# Patient Record
Sex: Female | Born: 1945
Health system: Southern US, Community
[De-identification: ages and names within clinical notes are randomized; demographics above are authoritative.]

## PROBLEM LIST (undated history)

## (undated) DIAGNOSIS — T7840XA Allergy, unspecified, initial encounter: Secondary | ICD-10-CM

## (undated) DIAGNOSIS — H269 Unspecified cataract: Secondary | ICD-10-CM

## (undated) DIAGNOSIS — N189 Chronic kidney disease, unspecified: Secondary | ICD-10-CM

## (undated) DIAGNOSIS — F419 Anxiety disorder, unspecified: Secondary | ICD-10-CM

## (undated) DIAGNOSIS — H409 Unspecified glaucoma: Secondary | ICD-10-CM

## (undated) DIAGNOSIS — M8580 Other specified disorders of bone density and structure, unspecified site: Secondary | ICD-10-CM

## (undated) DIAGNOSIS — M1990 Unspecified osteoarthritis, unspecified site: Secondary | ICD-10-CM

## (undated) DIAGNOSIS — E785 Hyperlipidemia, unspecified: Secondary | ICD-10-CM

## (undated) DIAGNOSIS — I10 Essential (primary) hypertension: Secondary | ICD-10-CM

## (undated) HISTORY — DX: Anxiety disorder, unspecified: F41.9

## (undated) HISTORY — DX: Essential (primary) hypertension: I10

## (undated) HISTORY — DX: Unspecified glaucoma: H40.9

## (undated) HISTORY — DX: Unspecified cataract: H26.9

## (undated) HISTORY — DX: Allergy, unspecified, initial encounter: T78.40XA

## (undated) HISTORY — DX: Unspecified osteoarthritis, unspecified site: M19.90

## (undated) HISTORY — DX: Hyperlipidemia, unspecified: E78.5

## (undated) HISTORY — PX: ABDOMINAL HYSTERECTOMY: SHX81

---

## 1998-01-30 ENCOUNTER — Other Ambulatory Visit: Admission: RE | Admit: 1998-01-30 | Discharge: 1998-01-30 | Payer: Self-pay | Admitting: Obstetrics & Gynecology

## 1998-05-02 ENCOUNTER — Inpatient Hospital Stay (HOSPITAL_COMMUNITY): Admission: RE | Admit: 1998-05-02 | Discharge: 1998-05-04 | Payer: Self-pay | Admitting: Obstetrics & Gynecology

## 2001-04-22 ENCOUNTER — Ambulatory Visit (HOSPITAL_COMMUNITY): Admission: RE | Admit: 2001-04-22 | Discharge: 2001-04-22 | Payer: Self-pay | Admitting: Family Medicine

## 2001-04-22 ENCOUNTER — Encounter: Payer: Self-pay | Admitting: Family Medicine

## 2002-05-02 ENCOUNTER — Other Ambulatory Visit: Admission: RE | Admit: 2002-05-02 | Discharge: 2002-05-02 | Payer: Self-pay | Admitting: Family Medicine

## 2002-07-26 ENCOUNTER — Ambulatory Visit (HOSPITAL_COMMUNITY): Admission: RE | Admit: 2002-07-26 | Discharge: 2002-07-26 | Payer: Self-pay | Admitting: Family Medicine

## 2002-07-26 ENCOUNTER — Encounter: Payer: Self-pay | Admitting: Family Medicine

## 2003-01-17 ENCOUNTER — Encounter: Admission: RE | Admit: 2003-01-17 | Discharge: 2003-01-17 | Payer: Self-pay | Admitting: Family Medicine

## 2003-01-17 ENCOUNTER — Encounter: Payer: Self-pay | Admitting: Family Medicine

## 2003-04-06 ENCOUNTER — Ambulatory Visit (HOSPITAL_COMMUNITY): Admission: RE | Admit: 2003-04-06 | Discharge: 2003-04-06 | Payer: Self-pay | Admitting: Family Medicine

## 2003-04-06 ENCOUNTER — Encounter: Payer: Self-pay | Admitting: Family Medicine

## 2003-11-27 ENCOUNTER — Other Ambulatory Visit: Admission: RE | Admit: 2003-11-27 | Discharge: 2003-11-27 | Payer: Self-pay | Admitting: Family Medicine

## 2003-12-10 ENCOUNTER — Ambulatory Visit (HOSPITAL_COMMUNITY): Admission: RE | Admit: 2003-12-10 | Discharge: 2003-12-10 | Payer: Self-pay | Admitting: Family Medicine

## 2004-03-10 ENCOUNTER — Encounter (INDEPENDENT_AMBULATORY_CARE_PROVIDER_SITE_OTHER): Payer: Self-pay | Admitting: *Deleted

## 2004-03-10 ENCOUNTER — Ambulatory Visit (HOSPITAL_COMMUNITY): Admission: RE | Admit: 2004-03-10 | Discharge: 2004-03-10 | Payer: Self-pay | Admitting: Gastroenterology

## 2005-02-17 ENCOUNTER — Other Ambulatory Visit: Admission: RE | Admit: 2005-02-17 | Discharge: 2005-02-17 | Payer: Self-pay | Admitting: Family Medicine

## 2005-02-27 ENCOUNTER — Encounter: Admission: RE | Admit: 2005-02-27 | Discharge: 2005-02-27 | Payer: Self-pay | Admitting: Family Medicine

## 2005-07-28 ENCOUNTER — Encounter: Admission: RE | Admit: 2005-07-28 | Discharge: 2005-07-28 | Payer: Self-pay | Admitting: Family Medicine

## 2006-03-30 ENCOUNTER — Other Ambulatory Visit: Admission: RE | Admit: 2006-03-30 | Discharge: 2006-03-30 | Payer: Self-pay | Admitting: Family Medicine

## 2006-04-14 ENCOUNTER — Encounter: Admission: RE | Admit: 2006-04-14 | Discharge: 2006-04-14 | Payer: Self-pay | Admitting: Family Medicine

## 2007-04-21 ENCOUNTER — Ambulatory Visit (HOSPITAL_COMMUNITY): Admission: RE | Admit: 2007-04-21 | Discharge: 2007-04-21 | Payer: Self-pay | Admitting: Family Medicine

## 2007-12-21 ENCOUNTER — Other Ambulatory Visit: Admission: RE | Admit: 2007-12-21 | Discharge: 2007-12-21 | Payer: Self-pay | Admitting: Family Medicine

## 2008-05-02 ENCOUNTER — Ambulatory Visit (HOSPITAL_COMMUNITY): Admission: RE | Admit: 2008-05-02 | Discharge: 2008-05-02 | Payer: Self-pay | Admitting: Family Medicine

## 2009-03-01 ENCOUNTER — Encounter: Admission: RE | Admit: 2009-03-01 | Discharge: 2009-03-01 | Payer: Self-pay | Admitting: Family Medicine

## 2009-03-06 ENCOUNTER — Encounter (HOSPITAL_COMMUNITY): Admission: RE | Admit: 2009-03-06 | Discharge: 2009-04-05 | Payer: Self-pay | Admitting: Family Medicine

## 2009-05-15 ENCOUNTER — Ambulatory Visit (HOSPITAL_COMMUNITY): Admission: RE | Admit: 2009-05-15 | Discharge: 2009-05-15 | Payer: Self-pay | Admitting: Family Medicine

## 2009-12-31 ENCOUNTER — Other Ambulatory Visit: Admission: RE | Admit: 2009-12-31 | Discharge: 2009-12-31 | Payer: Self-pay | Admitting: Family Medicine

## 2010-05-22 ENCOUNTER — Ambulatory Visit (HOSPITAL_COMMUNITY): Admission: RE | Admit: 2010-05-22 | Discharge: 2010-05-22 | Payer: Self-pay | Admitting: Family Medicine

## 2010-10-05 ENCOUNTER — Encounter: Payer: Self-pay | Admitting: Family Medicine

## 2010-12-26 ENCOUNTER — Other Ambulatory Visit: Payer: Self-pay | Admitting: Family Medicine

## 2010-12-26 ENCOUNTER — Other Ambulatory Visit (HOSPITAL_COMMUNITY)
Admission: RE | Admit: 2010-12-26 | Discharge: 2010-12-26 | Disposition: A | Discharge status: Home / Self Care | Payer: PRIVATE HEALTH INSURANCE | Source: Ambulatory Visit | Attending: Family Medicine | Admitting: Family Medicine

## 2010-12-26 DIAGNOSIS — Z01419 Encounter for gynecological examination (general) (routine) without abnormal findings: Secondary | ICD-10-CM | POA: Insufficient documentation

## 2011-01-30 NOTE — Op Note (Signed)
NAME:  Victoria Mcconnell, Victoria Mcconnell                          ACCOUNT NO.:  [id number]   MEDICAL RECORD NO.:  [id number]                   PATIENT TYPE:  AMB   LOCATION:  ENDO                                 FACILITY:  [facility]   PHYSICIAN:  Anselmo Rod, M.D.               DATE OF BIRTH:  [birth year]   DATE OF PROCEDURE:  03/10/2004  DATE OF DISCHARGE:                                 OPERATIVE REPORT   PROCEDURE PERFORMED:  Colonoscopy with biopsies.   ENDOSCOPIST:  Anselmo Rod, M.D.   INSTRUMENT USED:  Olympus video colonoscope.   INDICATION FOR PROCEDURE:  A 65 year old African-American female undergoing  screening colonoscopy.  Rule out colonic polyps, masses, etc.   PREPROCEDURE PREPARATION:  Informed consent was procured from the patient.  The patient was fasted for eight hours prior to the procedure and prepped  with a bottle of magnesium citrate and a gallon of GoLYTELY the night prior  to the procedure.   PREPROCEDURE PHYSICAL:  VITAL SIGNS:  The patient had stable vital signs.  NECK:  Supple.  CHEST:  Clear to auscultation.  S1, S2 regular.  ABDOMEN:  Soft with normal bowel sounds.   DESCRIPTION OF PROCEDURE:  The patient was placed in the left lateral  decubitus position and sedated with 100 mg of Demerol and 10 mg of Versed in  slow incremental doses.  Once the patient was adequately sedate and  maintained on low-flow oxygen and continuous cardiac monitoring, the Olympus  video colonoscope was advanced from the rectum to the cecum.  A polypoid  lesion was seen at 35 cm.  This was not snared.  The features of this were  somewhat atypical of a polyp.  This was not representative of an inverted  diverticulum, as it was larger than the usual inverted diverticulum and the  fact that the patient did not have any other diverticula in the sigmoid  colon.  Multiple biopsies were done.  The rest of the exam was unrevealing.   IMPRESSION:  Polypoid lesion, biopsied at 35 cm, otherwise  normal  colonoscopy.   RECOMMENDATIONS:  1. Await pathology results.  2. Further recommendations will be made depending on pathology results.  3. Outpatient follow-up in the next two weeks for further recommendations.                                               Anselmo Rod, M.D.    JNM/MEDQ  D:  03/10/2004  T:  03/10/2004  Job:  [id number]   cc:   Renaye Rakers, M.D.  [phone], [address]  [place]  [place] [address]  Fax: [phone]

## 2011-05-26 ENCOUNTER — Other Ambulatory Visit (HOSPITAL_COMMUNITY): Payer: Self-pay | Admitting: Family Medicine

## 2011-05-26 DIAGNOSIS — Z139 Encounter for screening, unspecified: Secondary | ICD-10-CM

## 2011-06-01 ENCOUNTER — Ambulatory Visit (HOSPITAL_COMMUNITY)
Admission: RE | Admit: 2011-06-01 | Discharge: 2011-06-01 | Disposition: A | Discharge status: Home / Self Care | Payer: PRIVATE HEALTH INSURANCE | Source: Ambulatory Visit | Attending: Family Medicine | Admitting: Family Medicine

## 2011-06-01 DIAGNOSIS — Z139 Encounter for screening, unspecified: Secondary | ICD-10-CM

## 2011-06-01 DIAGNOSIS — Z1231 Encounter for screening mammogram for malignant neoplasm of breast: Secondary | ICD-10-CM | POA: Insufficient documentation

## 2011-09-02 ENCOUNTER — Telehealth (HOSPITAL_COMMUNITY): Payer: Self-pay | Admitting: Dietician

## 2011-09-02 NOTE — Telephone Encounter (Signed)
Received referral on 08/31/11 from Dr. Parke Simmers for dx: HTN, diabetes, wt gain. Appointment scheduled for 09/09/11 @ 8:30 AM.

## 2011-09-09 ENCOUNTER — Encounter (HOSPITAL_COMMUNITY): Payer: Self-pay | Admitting: Dietician

## 2011-09-09 NOTE — Progress Notes (Signed)
Outpatient Initial Nutrition Assessment  Date:09/09/2011   Time: 8:30 AM  Referring Physician: Dr. Parke Simmers Reason for Visit: HTN, diabetes, weight gain  Nutrition Assessment:  Ht: 68" Wt: 211# IBW: 140# %IBW: 151% UBW: 180# %UBW: 117% BMI: 32.08 Goal Weight: 190 (10% weight loss) Weight hx: Pt reports that she has been struggling with her weight since having a hysterectomy in 1999. Per MD progress note, she has gained 7# (3.4%) x 1 year. Pt reports UBW of 180#. Her highest weight was 230# 5 years ago.   Estimated nutritional needs: 1751-1910 kcals daily, 77-96 grams protein daily, 1.8-1.9 L fluid daily  PMH: Past Medical History  Diagnosis Date  . Hypertension   . Arthritis   . Diabetes mellitus   . Hyperlipidemia     Medications:  Current Outpatient Rx  Name Route Sig Dispense Refill  . AMLODIPINE BESYLATE-VALSARTAN 5-160 MG PO TABS Oral Take 1 tablet by mouth daily.      . ATORVASTATIN CALCIUM 10 MG PO TABS Oral Take 20 mg by mouth daily.      . ATORVASTATIN CALCIUM 20 MG PO TABS Oral Take 20 mg by mouth daily.      . CELECOXIB 200 MG PO CAPS Oral Take 200 mg by mouth 2 (two) times daily.      [name] Kitchen CLINDAMYCIN PHOSPHATE 1 % EX SOLN Topical Apply topically 2 (two) times daily.      [name] Kitchen CLINDAMYCIN PHOS-BENZOYL PEROX 1.2-5 % EX GEL Topical Apply topically 2 (two) times daily.      [name] Kitchen DEXAMETHASONE 4 MG PO TABS Oral Take 4 mg by mouth daily.      . DORZOLAMIDE HCL-TIMOLOL MAL 22.3-6.8 MG/ML OP SOLN  1 drop 2 (two) times daily.      [name] Kitchen HYDROCORTISONE ACE-PRAMOXINE 1-1 % RE FOAM Rectal Place 1 applicator rectally 2 (two) times daily.      [name] Kitchen HYDROQUINONE 4 % EX CREA Topical Apply topically daily.      [name] Kitchen SITAGLIPTIN-METFORMIN HCL 50-500 MG PO TABS Oral Take 1 tablet by mouth daily.      . TERBINAFINE HCL 250 MG PO TABS Oral Take 250 mg by mouth daily.        Labs:  Per Dr. Tedra Senegal office note: CMET: Na: 141, K: 3.7, Cl: 106, CO2: 28, BUN: 15, Creat: 0.97, Glucose: 91, albumin: 4.3 CBC:  Hgb: 12, Hct: 35.7 Lipid Panel: Total Cholesterol: 146, HDL: 65, LDL: 67, Triglycerides: 69 Hgb A1c: 5.7   Nutrition hx/habits: Ms Stockburger is a very pleasant lady who is concerned about her health and weight gain. 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 She reports that she exercises daily (30 minutes daily on the stationery bike and 4560 minutes walking 2 times weekly) to improve her health. She reports that she is getting tired of eating salads and has cut back on red meat, potatoes, and canned foods. Pt monitors her blood sugar every morning (fasting) and readings range from 90-111. She has been eating more fruit because she was concerned about getting enough fiber in her diet after she had her colonoscopy to prevent polyps. She reports her stress level as 8 on a scale of 1-10; she assists her sister caring for their younger sister who is disabled and requires 24 hour care. She reports she went 4 weeks without exercising due to her exercise bike being broken. She eats out once a week, visiting [employer], [place], [place], [place], and  [employer].   Diet recall: 5:30 AM- wake up and 30 minute bike ride; 6:30 AM- diet green tea with medications; Breakfast (8:30 AM): oatmeal and peanut butter crackers; Snack: salted peanuts or walnuts; Lunch (12:30 PM): salad (lettuce, cucumber, tomato), granola, water; Snack (4:30 PM): apple, grapes, pear, and banana; Dinner (7-7:30): baked chicken, frozen vegetable, starch, water; Snack: popcorn and orange.   Nutrition Diagnosis: Excessive energy intake r/t nutrition related knowledge deficit, decreased physical activity AEB 3.4% weight gain x 1 year.  Nutrition Intervention: Nutrition rx: 1500 kcal NAS, high fiber,diabetic diet; 3 meals/day (45-60 grams carbohydrate per meal); 3 snacks (15-30 grams carbohydrate per snack); limit 1 piece of fruit per snack; at least 2.5 hours physical activity daily; low  calorie beverages only  Education/Counseling Provided: Educated pt on portion control. Discussed carbohydrate containing foods and portion sizes. Discussed new ideas for meals, snacks, and beverages. Discussed importance of a healthy diet in combination with exercise to promote weight loss. Discussed slow moderate weight loss of 1-2# per week. Discussed high fiber foods and ways to incorporate additional fiber into diet. Provided plate method, managing your diabetes, and handouts from the Academy of Nutrition and Dietetics re: diabetes.   Understanding, Motivation, Ability to Follow Recommendations: Expect good compliance. Pt is eager and willing to change her lifestyle to achieve her goals.   Monitoring and Evaluation: Goals: 1) 1-2# weight loss per week; 2) >2.5 hours physical activity daily; 3) Maintain Hgb A1c  Recommendations: 1) For weight loss: 1251-1410 kcals daily; 2) Incorporate fruit and high fiber foods into all meals and snacks (ex beans with salad at lunch or banana and peanut butter for snack; 3) Add small fruit slices or sugar-free drink mix to flavor water; 4) Keep food diary; 5) Weigh weekly at same day/ same time (ex. Weigh every week on Saturday at 10 AM)  F/U: PRN. Pt given RD contact information and encouraged to call with questions.   Orlene Plum, RD  09/09/2011  Time: 8:30 AM

## 2011-12-29 ENCOUNTER — Other Ambulatory Visit: Payer: Self-pay | Admitting: Family Medicine

## 2011-12-29 ENCOUNTER — Other Ambulatory Visit (HOSPITAL_COMMUNITY)
Admission: RE | Admit: 2011-12-29 | Discharge: 2011-12-29 | Disposition: A | Discharge status: Home / Self Care | Payer: PRIVATE HEALTH INSURANCE | Source: Ambulatory Visit | Attending: Family Medicine | Admitting: Family Medicine

## 2011-12-29 DIAGNOSIS — Z01419 Encounter for gynecological examination (general) (routine) without abnormal findings: Secondary | ICD-10-CM | POA: Insufficient documentation

## 2012-05-19 ENCOUNTER — Other Ambulatory Visit (HOSPITAL_COMMUNITY): Payer: Self-pay | Admitting: Family Medicine

## 2012-05-19 DIAGNOSIS — Z139 Encounter for screening, unspecified: Secondary | ICD-10-CM

## 2012-06-06 ENCOUNTER — Ambulatory Visit (HOSPITAL_COMMUNITY)
Admission: RE | Admit: 2012-06-06 | Discharge: 2012-06-06 | Disposition: A | Discharge status: Home / Self Care | Payer: PRIVATE HEALTH INSURANCE | Source: Ambulatory Visit | Attending: Family Medicine | Admitting: Family Medicine

## 2012-06-06 DIAGNOSIS — Z139 Encounter for screening, unspecified: Secondary | ICD-10-CM

## 2012-06-06 DIAGNOSIS — Z1231 Encounter for screening mammogram for malignant neoplasm of breast: Secondary | ICD-10-CM | POA: Insufficient documentation

## 2013-01-02 ENCOUNTER — Other Ambulatory Visit: Payer: Self-pay | Admitting: Family Medicine

## 2013-01-02 ENCOUNTER — Other Ambulatory Visit (HOSPITAL_COMMUNITY)
Admission: RE | Admit: 2013-01-02 | Discharge: 2013-01-02 | Disposition: A | Discharge status: Home / Self Care | Payer: PRIVATE HEALTH INSURANCE | Source: Ambulatory Visit | Attending: Family Medicine | Admitting: Family Medicine

## 2013-01-02 DIAGNOSIS — Z01419 Encounter for gynecological examination (general) (routine) without abnormal findings: Secondary | ICD-10-CM | POA: Insufficient documentation

## 2013-01-02 DIAGNOSIS — Z1151 Encounter for screening for human papillomavirus (HPV): Secondary | ICD-10-CM | POA: Insufficient documentation

## 2013-05-10 ENCOUNTER — Other Ambulatory Visit (HOSPITAL_COMMUNITY): Payer: Self-pay | Admitting: Family Medicine

## 2013-05-10 DIAGNOSIS — Z139 Encounter for screening, unspecified: Secondary | ICD-10-CM

## 2013-06-08 ENCOUNTER — Ambulatory Visit (HOSPITAL_COMMUNITY)
Admission: RE | Admit: 2013-06-08 | Discharge: 2013-06-08 | Disposition: A | Discharge status: Home / Self Care | Payer: PRIVATE HEALTH INSURANCE | Source: Ambulatory Visit | Attending: Family Medicine | Admitting: Family Medicine

## 2013-06-08 DIAGNOSIS — Z139 Encounter for screening, unspecified: Secondary | ICD-10-CM

## 2013-06-08 DIAGNOSIS — Z1231 Encounter for screening mammogram for malignant neoplasm of breast: Secondary | ICD-10-CM | POA: Insufficient documentation

## 2014-03-29 DIAGNOSIS — H4011X0 Primary open-angle glaucoma, stage unspecified: Secondary | ICD-10-CM | POA: Diagnosis not present

## 2014-03-29 DIAGNOSIS — H409 Unspecified glaucoma: Secondary | ICD-10-CM | POA: Diagnosis not present

## 2014-04-18 DIAGNOSIS — L708 Other acne: Secondary | ICD-10-CM | POA: Diagnosis not present

## 2014-04-18 DIAGNOSIS — L680 Hirsutism: Secondary | ICD-10-CM | POA: Diagnosis not present

## 2014-04-18 DIAGNOSIS — L818 Other specified disorders of pigmentation: Secondary | ICD-10-CM | POA: Insufficient documentation

## 2014-04-18 DIAGNOSIS — L700 Acne vulgaris: Secondary | ICD-10-CM | POA: Insufficient documentation

## 2014-04-18 DIAGNOSIS — L819 Disorder of pigmentation, unspecified: Secondary | ICD-10-CM | POA: Diagnosis not present

## 2014-05-08 DIAGNOSIS — E119 Type 2 diabetes mellitus without complications: Secondary | ICD-10-CM | POA: Diagnosis not present

## 2014-05-10 DIAGNOSIS — I10 Essential (primary) hypertension: Secondary | ICD-10-CM | POA: Diagnosis not present

## 2014-05-10 DIAGNOSIS — E119 Type 2 diabetes mellitus without complications: Secondary | ICD-10-CM | POA: Diagnosis not present

## 2014-05-10 DIAGNOSIS — E7800 Pure hypercholesterolemia, unspecified: Secondary | ICD-10-CM | POA: Diagnosis not present

## 2014-05-22 ENCOUNTER — Other Ambulatory Visit (HOSPITAL_COMMUNITY): Payer: Self-pay | Admitting: Family Medicine

## 2014-05-22 DIAGNOSIS — Z1231 Encounter for screening mammogram for malignant neoplasm of breast: Secondary | ICD-10-CM

## 2014-06-11 ENCOUNTER — Ambulatory Visit (HOSPITAL_COMMUNITY)
Admission: RE | Admit: 2014-06-11 | Discharge: 2014-06-11 | Disposition: A | Discharge status: Home / Self Care | Payer: Medicare Other | Source: Ambulatory Visit | Attending: Family Medicine | Admitting: Family Medicine

## 2014-06-11 DIAGNOSIS — Z1231 Encounter for screening mammogram for malignant neoplasm of breast: Secondary | ICD-10-CM

## 2014-07-02 DIAGNOSIS — H4011X1 Primary open-angle glaucoma, mild stage: Secondary | ICD-10-CM | POA: Diagnosis not present

## 2014-07-10 DIAGNOSIS — I10 Essential (primary) hypertension: Secondary | ICD-10-CM | POA: Diagnosis not present

## 2014-07-10 DIAGNOSIS — E119 Type 2 diabetes mellitus without complications: Secondary | ICD-10-CM | POA: Diagnosis not present

## 2014-07-10 DIAGNOSIS — E784 Other hyperlipidemia: Secondary | ICD-10-CM | POA: Diagnosis not present

## 2014-07-19 DIAGNOSIS — Z0000 Encounter for general adult medical examination without abnormal findings: Secondary | ICD-10-CM | POA: Diagnosis not present

## 2014-10-02 DIAGNOSIS — H4011X1 Primary open-angle glaucoma, mild stage: Secondary | ICD-10-CM | POA: Diagnosis not present

## 2014-10-02 DIAGNOSIS — H2513 Age-related nuclear cataract, bilateral: Secondary | ICD-10-CM | POA: Diagnosis not present

## 2014-10-02 DIAGNOSIS — E119 Type 2 diabetes mellitus without complications: Secondary | ICD-10-CM | POA: Diagnosis not present

## 2014-10-31 DIAGNOSIS — L700 Acne vulgaris: Secondary | ICD-10-CM | POA: Diagnosis not present

## 2014-10-31 DIAGNOSIS — L659 Nonscarring hair loss, unspecified: Secondary | ICD-10-CM | POA: Diagnosis not present

## 2014-10-31 DIAGNOSIS — L680 Hirsutism: Secondary | ICD-10-CM | POA: Diagnosis not present

## 2014-10-31 DIAGNOSIS — L669 Cicatricial alopecia, unspecified: Secondary | ICD-10-CM | POA: Diagnosis not present

## 2014-10-31 DIAGNOSIS — L731 Pseudofolliculitis barbae: Secondary | ICD-10-CM | POA: Diagnosis not present

## 2014-10-31 DIAGNOSIS — L089 Local infection of the skin and subcutaneous tissue, unspecified: Secondary | ICD-10-CM | POA: Diagnosis not present

## 2014-10-31 DIAGNOSIS — L818 Other specified disorders of pigmentation: Secondary | ICD-10-CM | POA: Diagnosis not present

## 2014-11-05 DIAGNOSIS — I10 Essential (primary) hypertension: Secondary | ICD-10-CM | POA: Diagnosis not present

## 2014-11-05 DIAGNOSIS — E0800 Diabetes mellitus due to underlying condition with hyperosmolarity without nonketotic hyperglycemic-hyperosmolar coma (NKHHC): Secondary | ICD-10-CM | POA: Diagnosis not present

## 2014-11-05 DIAGNOSIS — E782 Mixed hyperlipidemia: Secondary | ICD-10-CM | POA: Diagnosis not present

## 2014-11-07 DIAGNOSIS — E119 Type 2 diabetes mellitus without complications: Secondary | ICD-10-CM | POA: Diagnosis not present

## 2014-11-07 DIAGNOSIS — I10 Essential (primary) hypertension: Secondary | ICD-10-CM | POA: Diagnosis not present

## 2014-11-27 DIAGNOSIS — I10 Essential (primary) hypertension: Secondary | ICD-10-CM | POA: Diagnosis not present

## 2014-11-27 DIAGNOSIS — Z9114 Patient's other noncompliance with medication regimen: Secondary | ICD-10-CM | POA: Diagnosis not present

## 2014-11-27 DIAGNOSIS — E119 Type 2 diabetes mellitus without complications: Secondary | ICD-10-CM | POA: Diagnosis not present

## 2014-12-31 ENCOUNTER — Encounter: Payer: Self-pay | Admitting: *Deleted

## 2015-01-01 DIAGNOSIS — H4011X1 Primary open-angle glaucoma, mild stage: Secondary | ICD-10-CM | POA: Diagnosis not present

## 2015-01-16 ENCOUNTER — Ambulatory Visit
Admission: RE | Admit: 2015-01-16 | Discharge: 2015-01-16 | Disposition: A | Discharge status: Home / Self Care | Payer: Medicare Other | Source: Ambulatory Visit | Attending: Family Medicine | Admitting: Family Medicine

## 2015-01-16 ENCOUNTER — Ambulatory Visit (INDEPENDENT_AMBULATORY_CARE_PROVIDER_SITE_OTHER): Payer: Medicare Other | Admitting: Family Medicine

## 2015-01-16 ENCOUNTER — Encounter: Payer: Self-pay | Admitting: Family Medicine

## 2015-01-16 VITALS — BP 130/74 | HR 74 | Temp 98.0°F | Resp 12 | Ht 68.0 in | Wt 212.0 lb

## 2015-01-16 DIAGNOSIS — E669 Obesity, unspecified: Secondary | ICD-10-CM | POA: Diagnosis not present

## 2015-01-16 DIAGNOSIS — E785 Hyperlipidemia, unspecified: Secondary | ICD-10-CM

## 2015-01-16 DIAGNOSIS — M25512 Pain in left shoulder: Secondary | ICD-10-CM | POA: Diagnosis not present

## 2015-01-16 DIAGNOSIS — E119 Type 2 diabetes mellitus without complications: Secondary | ICD-10-CM

## 2015-01-16 DIAGNOSIS — M47812 Spondylosis without myelopathy or radiculopathy, cervical region: Secondary | ICD-10-CM | POA: Diagnosis not present

## 2015-01-16 DIAGNOSIS — I10 Essential (primary) hypertension: Secondary | ICD-10-CM

## 2015-01-16 DIAGNOSIS — S93402A Sprain of unspecified ligament of left ankle, initial encounter: Secondary | ICD-10-CM

## 2015-01-16 DIAGNOSIS — M9971 Connective tissue and disc stenosis of intervertebral foramina of cervical region: Secondary | ICD-10-CM | POA: Diagnosis not present

## 2015-01-16 DIAGNOSIS — R7303 Prediabetes: Secondary | ICD-10-CM | POA: Insufficient documentation

## 2015-01-16 DIAGNOSIS — Z6833 Body mass index (BMI) 33.0-33.9, adult: Secondary | ICD-10-CM | POA: Insufficient documentation

## 2015-01-16 DIAGNOSIS — M19011 Primary osteoarthritis, right shoulder: Secondary | ICD-10-CM | POA: Diagnosis not present

## 2015-01-16 DIAGNOSIS — H409 Unspecified glaucoma: Secondary | ICD-10-CM

## 2015-01-16 DIAGNOSIS — M4312 Spondylolisthesis, cervical region: Secondary | ICD-10-CM | POA: Diagnosis not present

## 2015-01-16 DIAGNOSIS — Z87828 Personal history of other (healed) physical injury and trauma: Secondary | ICD-10-CM | POA: Diagnosis not present

## 2015-01-16 MED ORDER — METFORMIN HCL 1000 MG PO TABS: 1000.0000 mg | ORAL_TABLET | Freq: Two times a day (BID) | ORAL | Status: DC

## 2015-01-16 NOTE — Assessment & Plan Note (Signed)
Unable to afford Janumet, normal renal function Will place on MTF 1000mg  BID,

## 2015-01-16 NOTE — Patient Instructions (Addendum)
Take Metformin 1000mg  twice a day  Take aleve 1 tablet twice a day  Continue all other medications Get the xray done on your shoulder F/U 6 weeks for fasting labs

## 2015-01-16 NOTE — Progress Notes (Signed)
Patient ID: Victoria Mcconnell, female   DOB: Dec 27, 1945, 69 y.o.   MRN: [id number]   Subjective:    Patient ID: Victoria Mcconnell, female    DOB: 02/26/1946, 69 y.o.   MRN: [id number]  Patient presents for New Patient- Establish Care and L Ankle Pain  Pt here to establsih care. Previous PCP Dr. Darlyne Russian GI- Dr. Juanita Craver,  Derm- Dr. Kemper Durie Medication and history reviewed   DM- Diagnosed in 2004, last A1C in Feb 6.2%, unable to afford Janumet cost her $30 or more per month, needs Tier 1 drug, no hypoglycemia, test daily   Glaucoma- [facility] on drops   HTN- since her 30's well controlled with meds, does not check at home,normal renal and liver function   Sprained left ankle 3 weeks ago, stepped wrong off her steps at home, twisted foot, had some mild swelling and pain. Used epson salt soaks, now only has pain if she twist it a wrong way, no NSAIDS taken   Chronic left shoulder pain, had injury on the job > 5 years ago, never saught care, gets a pinching pain and soreness of upper arm and shoulder from time to time, no tingling or numbness in finger  Colonoscopy, Mammo, UTD, will need Prevnar 13 next visit  Review Of Systems:  GEN- denies fatigue, fever, weight loss,weakness, recent illness HEENT- denies eye drainage, change in vision, nasal discharge, CVS- denies chest pain, palpitations RESP- denies SOB, cough, wheeze ABD- denies N/V, change in stools, abd pain GU- denies dysuria, hematuria, dribbling, incontinence MSK- denies joint pain, muscle aches, injury Neuro- denies headache, dizziness, syncope, seizure activity       Objective:    BP 130/74 mmHg  Pulse 74  Temp(Src) 98 F (36.7 C) (Oral)  Resp 12  Ht 5\' 8"  (1.727 m)  Wt 212 lb (96.163 kg)  BMI 32.24 kg/m2 GEN- NAD, alert and oriented x3 HEENT- PERRL, EOMI, non injected sclera, pink conjunctiva, MMM, oropharynx clear Neck- Supple, no thyromegaly, fair ROM CVS- RRR, no  murmur RESP-CTAB ABD-NABS,soft,NT,ND MSK- Left ankle, good ROM, NT, neg squeeze test, able to bear weight,, mild swelling left lateral malleous, Spine NT, fair ROM upper ext shoulder, right shoulder sits up a little higher than left EXT- No edema Pulses- Radial, DP- 2+        Assessment & Plan:      Problem List Items Addressed This Visit    Obesity    Discussed healthy diet and eating      Relevant Medications   metFORMIN (GLUCOPHAGE) 1000 MG tablet   Left shoulder pain    Old injury, like has some DJD and DDD in neck, obtain xrays Start aleve      Relevant Orders   DG Shoulder Right (Completed)   DG Cervical Spine Complete (Completed)   Hyperlipidemia    Lipids at goal on statin      Glaucoma    Followed by eye doctor      Essential hypertension, benign    Well controlled no change to meds      Diabetes mellitus type 2, controlled    Unable to afford Janumet, normal renal function Will place on MTF 1000mg  BID,       Relevant Medications   metFORMIN (GLUCOPHAGE) 1000 MG tablet    Other Visit Diagnoses    Left ankle sprain, initial encounter    -  Primary    good ROM, mild swelling at lateral malleous, weight bearing, Aleve BID  Note: This dictation was prepared with Dragon dictation along with smaller phrase technology. Any transcriptional errors that result from this process are unintentional.

## 2015-01-16 NOTE — Assessment & Plan Note (Signed)
Well controlled no change to meds 

## 2015-01-16 NOTE — Assessment & Plan Note (Signed)
Old injury, like has some DJD and DDD in neck, obtain xrays Start aleve

## 2015-01-16 NOTE — Assessment & Plan Note (Signed)
Lipids at goal on statin

## 2015-01-16 NOTE — Assessment & Plan Note (Signed)
Discussed healthy diet and eating

## 2015-01-16 NOTE — Assessment & Plan Note (Signed)
Followed by eye doctor.  

## 2015-02-27 ENCOUNTER — Ambulatory Visit (INDEPENDENT_AMBULATORY_CARE_PROVIDER_SITE_OTHER): Payer: Medicare Other | Admitting: Family Medicine

## 2015-02-27 ENCOUNTER — Encounter: Payer: Self-pay | Admitting: Family Medicine

## 2015-02-27 VITALS — BP 126/72 | HR 74 | Temp 98.1°F | Resp 12 | Ht 68.0 in | Wt 209.0 lb

## 2015-02-27 DIAGNOSIS — M8949 Other hypertrophic osteoarthropathy, multiple sites: Secondary | ICD-10-CM

## 2015-02-27 DIAGNOSIS — M1990 Unspecified osteoarthritis, unspecified site: Secondary | ICD-10-CM | POA: Insufficient documentation

## 2015-02-27 DIAGNOSIS — S93409A Sprain of unspecified ligament of unspecified ankle, initial encounter: Secondary | ICD-10-CM | POA: Insufficient documentation

## 2015-02-27 DIAGNOSIS — S93402D Sprain of unspecified ligament of left ankle, subsequent encounter: Secondary | ICD-10-CM | POA: Diagnosis not present

## 2015-02-27 DIAGNOSIS — M159 Polyosteoarthritis, unspecified: Secondary | ICD-10-CM

## 2015-02-27 DIAGNOSIS — M150 Primary generalized (osteo)arthritis: Secondary | ICD-10-CM

## 2015-02-27 DIAGNOSIS — E119 Type 2 diabetes mellitus without complications: Secondary | ICD-10-CM

## 2015-02-27 DIAGNOSIS — M7070 Other bursitis of hip, unspecified hip: Secondary | ICD-10-CM | POA: Insufficient documentation

## 2015-02-27 DIAGNOSIS — E785 Hyperlipidemia, unspecified: Secondary | ICD-10-CM | POA: Diagnosis not present

## 2015-02-27 DIAGNOSIS — M7072 Other bursitis of hip, left hip: Secondary | ICD-10-CM

## 2015-02-27 DIAGNOSIS — I10 Essential (primary) hypertension: Secondary | ICD-10-CM

## 2015-02-27 LAB — COMPLETE METABOLIC PANEL WITH GFR
ALBUMIN: 4.3 g/dL (ref 3.5–5.2)
ALK PHOS: 65 U/L (ref 39–117)
ALT: 18 U/L (ref 0–35)
AST: 22 U/L (ref 0–37)
BUN: 13 mg/dL (ref 6–23)
CO2: 26 mEq/L (ref 19–32)
Calcium: 9.9 mg/dL (ref 8.4–10.5)
Chloride: 106 mEq/L (ref 96–112)
Creat: 0.96 mg/dL (ref 0.50–1.10)
GFR, Est African American: 71 mL/min
GFR, Est Non African American: 61 mL/min
Glucose, Bld: 92 mg/dL (ref 70–99)
POTASSIUM: 4 meq/L (ref 3.5–5.3)
SODIUM: 138 meq/L (ref 135–145)
Total Bilirubin: 0.5 mg/dL (ref 0.2–1.2)
Total Protein: 7.3 g/dL (ref 6.0–8.3)

## 2015-02-27 LAB — CBC WITH DIFFERENTIAL/PLATELET
Basophils Absolute: 0.1 10*3/uL (ref 0.0–0.1)
Basophils Relative: 1 % (ref 0–1)
EOS ABS: 0.5 10*3/uL (ref 0.0–0.7)
Eosinophils Relative: 6 % — ABNORMAL HIGH (ref 0–5)
HEMATOCRIT: 33.5 % — AB (ref 36.0–46.0)
Hemoglobin: 11.2 g/dL — ABNORMAL LOW (ref 12.0–15.0)
LYMPHS ABS: 1.6 10*3/uL (ref 0.7–4.0)
Lymphocytes Relative: 21 % (ref 12–46)
MCH: 28.9 pg (ref 26.0–34.0)
MCHC: 33.4 g/dL (ref 30.0–36.0)
MCV: 86.6 fL (ref 78.0–100.0)
MONO ABS: 0.5 10*3/uL (ref 0.1–1.0)
MPV: 9.9 fL (ref 8.6–12.4)
Monocytes Relative: 7 % (ref 3–12)
Neutro Abs: 4.9 10*3/uL (ref 1.7–7.7)
Neutrophils Relative %: 65 % (ref 43–77)
Platelets: 247 10*3/uL (ref 150–400)
RBC: 3.87 MIL/uL (ref 3.87–5.11)
RDW: 14.7 % (ref 11.5–15.5)
WBC: 7.6 10*3/uL (ref 4.0–10.5)

## 2015-02-27 LAB — HEMOGLOBIN A1C
Hgb A1c MFr Bld: 6.1 % — ABNORMAL HIGH (ref <5.7)
Mean Plasma Glucose: 128 mg/dL — ABNORMAL HIGH (ref <117)

## 2015-02-27 LAB — LIPID PANEL
Cholesterol: 144 mg/dL (ref 0–200)
HDL: 67 mg/dL (ref >=46)
LDL CALC: 61 mg/dL (ref 0–99)
Total CHOL/HDL Ratio: 2.1 Ratio
Triglycerides: 81 mg/dL (ref <150)
VLDL: 16 mg/dL (ref 0–40)

## 2015-02-27 MED ORDER — AMLODIPINE-VALSARTAN-HCTZ 5-160-25 MG PO TABS: 1.0000 | ORAL_TABLET | Freq: Every morning | ORAL | Status: DC

## 2015-02-27 MED ORDER — METHYLPREDNISOLONE 4 MG PO TBPK: ORAL_TABLET | ORAL | Status: DC

## 2015-02-27 NOTE — Patient Instructions (Signed)
Take the medrol dosepak as prescribed Do not take with aleve We will call you with results F/U 4 months

## 2015-02-27 NOTE — Progress Notes (Signed)
Patient ID: Victoria Mcconnell, female   DOB: 1946/07/04, 69 y.o.   MRN: [id number]   Subjective:    Patient ID: Victoria Mcconnell, female    DOB: Mar 26, 1946, 69 y.o.   MRN: [id number]  Patient presents for 6 week F/U  Here for interim follow-up and for fasting labs. I reviewed her previous PCP records and labs. Diabetes mellitus her last A1c was 6.2% back in February. We change her to metformin she has not had any problems with this medication and her blood sugar has been staying down.  She did have some upset stomach with taking Lipitor in the morning she is now changed to set nighttime is not had any problems her last cholesterol panel was normal with an LDL of 68.  She continues to have some discomfort from her ankles since she injured it back in April she still walking and exercising but she does notice after long. The time when she bears weight on it some discomfort. The Aleve does help. She also complains of some hip pain on and off with a numbness feeling right on the lateral part of her hip. She denies any recent back pain denies any tingling or numbness in her lower extremities no change in bowel or bladder. She states that she's had back problems in the past.   Review Of Systems:  GEN- denies fatigue, fever, weight loss,weakness, recent illness HEENT- denies eye drainage, change in vision, nasal discharge, CVS- denies chest pain, palpitations RESP- denies SOB, cough, wheeze ABD- denies N/V, change in stools, abd pain GU- denies dysuria, hematuria, dribbling, incontinence MSK- + joint pain, muscle aches, injury Neuro- denies headache, dizziness, syncope, seizure activity       Objective:    BP 126/72 mmHg  Pulse 74  Temp(Src) 98.1 F (36.7 C) (Oral)  Resp 12  Ht 5\' 8"  (1.727 m)  Wt 209 lb (94.802 kg)  BMI 31.79 kg/m2 GEN- NAD, alert and oriented x3 HEENT- PERRL, EOMI, non injected sclera, pink conjunctiva, MMM, oropharynx clear CVS- RRR, no murmur RESP-CTAB MSK- Left ankle,  good ROM, NT, , discomfort with extension of foot, neg squeeze test, able to bear weight,, mild swelling left lateral malleous, Spine NT, fair ROM , Bilat Hip- fair ROM, pain with IR left hip, mild TTP over tronchanter region, neg SLR   EXT- No edema Pulses- Radial, DP- 2+    Assessment & Plan:      Problem List Items Addressed This Visit    OA (osteoarthritis)   Relevant Medications   methylPREDNISolone (MEDROL DOSEPAK) 4 MG TBPK tablet   Hyperlipidemia   Relevant Medications   Amlodipine-Valsartan-HCTZ 5-160-25 MG TABS   Other Relevant Orders   COMPLETE METABOLIC PANEL WITH GFR   CBC with Differential   Hemoglobin A1c   Lipid Panel   Hip bursitis   Essential hypertension, benign   Relevant Medications   Amlodipine-Valsartan-HCTZ 5-160-25 MG TABS   Other Relevant Orders   COMPLETE METABOLIC PANEL WITH GFR   CBC with Differential   Hemoglobin A1c   Lipid Panel   Diabetes mellitus type 2, controlled - Primary   Relevant Medications   Amlodipine-Valsartan-HCTZ 5-160-25 MG TABS   Other Relevant Orders   Microalbumin / creatinine urine ratio   COMPLETE METABOLIC PANEL WITH GFR   CBC with Differential   Hemoglobin A1c   Lipid Panel   Ankle sprain      Note: This dictation was prepared with Dragon dictation along with smaller phrase technology. Any transcriptional errors that  result from this process are unintentional.

## 2015-02-27 NOTE — Assessment & Plan Note (Signed)
I think she may suffer a severe ankle sprain back in April but she is doing all of her regular activities so I see no reason to truly image at this time. She declines orthopedics

## 2015-02-27 NOTE — Assessment & Plan Note (Signed)
Continue Lipitor at bedtime check her cholesterol panel today as well as liver function tests

## 2015-02-27 NOTE — Assessment & Plan Note (Signed)
Diabetes is well-controlled recheck A1c continue metformin

## 2015-02-27 NOTE — Assessment & Plan Note (Signed)
She has known generalized osteoarthritis she has it on the x-rays with degenerative changes in her neck and shoulder as well. I am going to treat her with a Medrol Dosepak for possible bursitis of her left hip. She will hold the Aleve while taking this. We did discuss orthopedist but she does not want that intervention at this time.

## 2015-02-28 LAB — MICROALBUMIN / CREATININE URINE RATIO
Creatinine, Urine: 108.3 mg/dL
MICROALB UR: 0.3 mg/dL (ref <2.0)
Microalb Creat Ratio: 2.8 mg/g (ref 0.0–30.0)

## 2015-03-04 ENCOUNTER — Telehealth: Payer: Self-pay | Admitting: Family Medicine

## 2015-03-04 ENCOUNTER — Other Ambulatory Visit: Payer: Self-pay | Admitting: Family Medicine

## 2015-03-04 MED ORDER — METFORMIN HCL 1000 MG PO TABS: 1000.0000 mg | ORAL_TABLET | Freq: Two times a day (BID) | ORAL | Status: DC

## 2015-03-04 MED ORDER — AMLODIPINE BESYLATE 5 MG PO TABS: 5.0000 mg | ORAL_TABLET | Freq: Every day | ORAL | Status: DC

## 2015-03-04 MED ORDER — MONTELUKAST SODIUM 10 MG PO TABS: 10.0000 mg | ORAL_TABLET | Freq: Every day | ORAL | Status: DC

## 2015-03-04 MED ORDER — VALSARTAN-HYDROCHLOROTHIAZIDE 160-25 MG PO TABS: 1.0000 | ORAL_TABLET | Freq: Every day | ORAL | Status: DC

## 2015-03-04 NOTE — Telephone Encounter (Signed)
Medication refilled per protocol. 

## 2015-03-04 NOTE — Telephone Encounter (Signed)
The combination medication Amlodipine/Valsartan/HCTZ co pay is too expensive.  She would like to change to two pills.  The Amlodipine alone along with Valsartan/Hctz combo.  Refill sent as two Rx's to mail order same quantity and refills as last week

## 2015-03-04 NOTE — Telephone Encounter (Signed)
Agree with above 

## 2015-04-02 DIAGNOSIS — H4011X1 Primary open-angle glaucoma, mild stage: Secondary | ICD-10-CM | POA: Diagnosis not present

## 2015-05-23 ENCOUNTER — Other Ambulatory Visit: Payer: Self-pay | Admitting: Family Medicine

## 2015-05-23 DIAGNOSIS — Z1231 Encounter for screening mammogram for malignant neoplasm of breast: Secondary | ICD-10-CM

## 2015-05-27 ENCOUNTER — Other Ambulatory Visit: Payer: Self-pay | Admitting: *Deleted

## 2015-05-27 MED ORDER — ATORVASTATIN CALCIUM 20 MG PO TABS: 20.0000 mg | ORAL_TABLET | Freq: Every day | ORAL | Status: DC

## 2015-05-27 NOTE — Telephone Encounter (Signed)
Received fax requesting refill on Lipitor.   Refill appropriate and filled per protocol. 

## 2015-07-01 ENCOUNTER — Ambulatory Visit (INDEPENDENT_AMBULATORY_CARE_PROVIDER_SITE_OTHER): Payer: Medicare Other | Admitting: Family Medicine

## 2015-07-01 ENCOUNTER — Encounter: Payer: Self-pay | Admitting: Family Medicine

## 2015-07-01 VITALS — BP 130/72 | HR 76 | Temp 98.3°F | Resp 14 | Ht 68.0 in | Wt 204.0 lb

## 2015-07-01 DIAGNOSIS — E785 Hyperlipidemia, unspecified: Secondary | ICD-10-CM

## 2015-07-01 DIAGNOSIS — F4321 Adjustment disorder with depressed mood: Secondary | ICD-10-CM

## 2015-07-01 DIAGNOSIS — I10 Essential (primary) hypertension: Secondary | ICD-10-CM | POA: Diagnosis not present

## 2015-07-01 DIAGNOSIS — Z23 Encounter for immunization: Secondary | ICD-10-CM

## 2015-07-01 DIAGNOSIS — E119 Type 2 diabetes mellitus without complications: Secondary | ICD-10-CM | POA: Diagnosis not present

## 2015-07-01 DIAGNOSIS — F321 Major depressive disorder, single episode, moderate: Secondary | ICD-10-CM

## 2015-07-01 DIAGNOSIS — E669 Obesity, unspecified: Secondary | ICD-10-CM | POA: Diagnosis not present

## 2015-07-01 HISTORY — DX: Major depressive disorder, single episode, moderate: F32.1

## 2015-07-01 LAB — COMPREHENSIVE METABOLIC PANEL
ALK PHOS: 65 U/L (ref 33–130)
ALT: 13 U/L (ref 6–29)
AST: 17 U/L (ref 10–35)
Albumin: 4.1 g/dL (ref 3.6–5.1)
BILIRUBIN TOTAL: 0.6 mg/dL (ref 0.2–1.2)
BUN: 14 mg/dL (ref 7–25)
CALCIUM: 9.6 mg/dL (ref 8.6–10.4)
CO2: 25 mmol/L (ref 20–31)
Chloride: 106 mmol/L (ref 98–110)
Creat: 0.95 mg/dL (ref 0.50–0.99)
Glucose, Bld: 99 mg/dL (ref 70–99)
POTASSIUM: 3.3 mmol/L — AB (ref 3.5–5.3)
Sodium: 140 mmol/L (ref 135–146)
TOTAL PROTEIN: 7.2 g/dL (ref 6.1–8.1)

## 2015-07-01 LAB — CBC WITH DIFFERENTIAL/PLATELET
BASOS ABS: 0.1 10*3/uL (ref 0.0–0.1)
Basophils Relative: 1 % (ref 0–1)
Eosinophils Absolute: 0.4 10*3/uL (ref 0.0–0.7)
Eosinophils Relative: 5 % (ref 0–5)
HEMATOCRIT: 33 % — AB (ref 36.0–46.0)
Hemoglobin: 10.9 g/dL — ABNORMAL LOW (ref 12.0–15.0)
LYMPHS ABS: 1.7 10*3/uL (ref 0.7–4.0)
LYMPHS PCT: 23 % (ref 12–46)
MCH: 28.8 pg (ref 26.0–34.0)
MCHC: 33 g/dL (ref 30.0–36.0)
MCV: 87.3 fL (ref 78.0–100.0)
MPV: 10.3 fL (ref 8.6–12.4)
Monocytes Absolute: 0.4 10*3/uL (ref 0.1–1.0)
Monocytes Relative: 5 % (ref 3–12)
NEUTROS ABS: 4.8 10*3/uL (ref 1.7–7.7)
NEUTROS PCT: 66 % (ref 43–77)
Platelets: 248 10*3/uL (ref 150–400)
RBC: 3.78 MIL/uL — AB (ref 3.87–5.11)
RDW: 14.2 % (ref 11.5–15.5)
WBC: 7.2 10*3/uL (ref 4.0–10.5)

## 2015-07-01 LAB — LIPID PANEL
CHOLESTEROL: 133 mg/dL (ref 125–200)
HDL: 62 mg/dL (ref >=46)
LDL CALC: 56 mg/dL (ref <130)
TRIGLYCERIDES: 77 mg/dL (ref <150)
Total CHOL/HDL Ratio: 2.1 Ratio (ref <=5.0)
VLDL: 15 mg/dL (ref <30)

## 2015-07-01 LAB — HEMOGLOBIN A1C
HEMOGLOBIN A1C: 6.1 % — AB (ref <5.7)
Mean Plasma Glucose: 128 mg/dL — ABNORMAL HIGH (ref <117)

## 2015-07-01 MED ORDER — ALPRAZOLAM 1 MG PO TABS: 1.0000 mg | ORAL_TABLET | Freq: Two times a day (BID) | ORAL | Status: DC | PRN

## 2015-07-01 NOTE — Assessment & Plan Note (Signed)
Increase xanax to 1mg  , has good support, she is to call for any concerns

## 2015-07-01 NOTE — Assessment & Plan Note (Addendum)
Well controlled  Note will give PREVNAR 13 AT [place]

## 2015-07-01 NOTE — Progress Notes (Signed)
Patient ID: Victoria Mcconnell, female   DOB: 01/15/1946, 69 y.o.   MRN: [id number]   Subjective:    Patient ID: Victoria Mcconnell, female    DOB: 1946-06-28, 69 y.o.   MRN: [id number]  Patient presents for 4 month F/U  is here to follow-up her chronic medical problems. She tells me that her husband committed suicide this past week there were no signs leading up to this. She actually found him in their backyard. She has a good support system with her children and her family in church. She has not been sleeping very well she's been taking the Xanax 0.5 which helps some but seems to wear off or like not strong enough.  Diabetes mellitus her blood sugars in general have been good they've been in the mid 100s the past couple of days but no hypoglycemia symptoms. She is still taking all her medications. She states physically she feels well she's just a little overwhelmed with everything going on from her husband's death.    Review Of Systems:  GEN- denies fatigue, fever, weight loss,weakness, recent illness HEENT- denies eye drainage, change in vision, nasal discharge, CVS- denies chest pain, palpitations RESP- denies SOB, cough, wheeze ABD- denies N/V, change in stools, abd pain GU- denies dysuria, hematuria, dribbling, incontinence MSK- denies joint pain, muscle aches, injury Neuro- denies headache, dizziness, syncope, seizure activity       Objective:    BP 130/72 mmHg  Pulse 76  Temp(Src) 98.3 F (36.8 C) (Oral)  Resp 14  Ht 5\' 8"  (1.727 m)  Wt 204 lb (92.534 kg)  BMI 31.03 kg/m2 GEN- NAD, alert and oriented x3 HEENT- PERRL, EOMI, non injected sclera, pink conjunctiva, MMM, oropharynx clear CVS- RRR, no murmur RESP-CTAB Psych- tearful, not anxious appearing, no SI, well groomed, good eye contact EXT- No edema Pulses- Radial, DP- 2+        Assessment & Plan:      Problem List Items Addressed This Visit    Obesity   Hyperlipidemia - Primary   Relevant Orders   Lipid panel   Grief reaction    Increase xanax to 1mg  , has good support, she is to call for any concerns      Essential hypertension, benign    Well controlled      Relevant Orders   CBC with Differential/Platelet   Comprehensive metabolic panel   Diabetes mellitus type 2, controlled ([place])    Well controlled, recheck A1C, on MTF, STATIN, ACEI      Relevant Orders   Hemoglobin A1c    Other Visit Diagnoses    Need for prophylactic vaccination and inoculation against influenza        Relevant Orders    Flu Vaccine QUAD 36+ mos IM (Fluarix & Fluzone Quad PF (Completed)       Note: This dictation was prepared with Dragon dictation along with smaller Company secretary. Any transcriptional errors that result from this process are unintentional.

## 2015-07-01 NOTE — Assessment & Plan Note (Signed)
Well controlled, recheck A1C, on MTF, STATIN, ACEI

## 2015-07-01 NOTE — Patient Instructions (Signed)
Xanax increased to 1mg   Flu shot given We will call with lab results F/U December for Wellness Exam

## 2015-07-03 ENCOUNTER — Ambulatory Visit (HOSPITAL_COMMUNITY): Payer: Medicare Other

## 2015-07-05 ENCOUNTER — Ambulatory Visit (HOSPITAL_COMMUNITY)
Admission: RE | Admit: 2015-07-05 | Discharge: 2015-07-05 | Disposition: A | Discharge status: Home / Self Care | Payer: Medicare Other | Source: Ambulatory Visit | Attending: Family Medicine | Admitting: Family Medicine

## 2015-07-05 DIAGNOSIS — Z1231 Encounter for screening mammogram for malignant neoplasm of breast: Secondary | ICD-10-CM | POA: Insufficient documentation

## 2015-07-08 DIAGNOSIS — H401131 Primary open-angle glaucoma, bilateral, mild stage: Secondary | ICD-10-CM | POA: Diagnosis not present

## 2015-07-22 ENCOUNTER — Other Ambulatory Visit: Payer: Self-pay | Admitting: Family Medicine

## 2015-07-22 NOTE — Telephone Encounter (Signed)
Refill appropriate and filled per protocol. 

## 2015-08-05 DIAGNOSIS — L668 Other cicatricial alopecia: Secondary | ICD-10-CM | POA: Insufficient documentation

## 2015-08-05 DIAGNOSIS — L6681 Central centrifugal cicatricial alopecia: Secondary | ICD-10-CM | POA: Insufficient documentation

## 2015-08-05 DIAGNOSIS — L818 Other specified disorders of pigmentation: Secondary | ICD-10-CM | POA: Diagnosis not present

## 2015-08-05 DIAGNOSIS — L700 Acne vulgaris: Secondary | ICD-10-CM | POA: Diagnosis not present

## 2015-08-05 DIAGNOSIS — L669 Cicatricial alopecia, unspecified: Secondary | ICD-10-CM | POA: Diagnosis not present

## 2015-08-16 ENCOUNTER — Other Ambulatory Visit: Payer: Self-pay | Admitting: Family Medicine

## 2015-08-16 NOTE — Telephone Encounter (Signed)
Refill appropriate and filled per protocol. 

## 2015-08-27 ENCOUNTER — Ambulatory Visit (INDEPENDENT_AMBULATORY_CARE_PROVIDER_SITE_OTHER): Payer: Medicare Other | Admitting: Family Medicine

## 2015-08-27 ENCOUNTER — Encounter: Payer: Self-pay | Admitting: Family Medicine

## 2015-08-27 VITALS — BP 130/74 | HR 72 | Temp 98.3°F | Resp 16 | Ht 68.0 in | Wt 209.0 lb

## 2015-08-27 DIAGNOSIS — Z13820 Encounter for screening for osteoporosis: Secondary | ICD-10-CM | POA: Diagnosis not present

## 2015-08-27 DIAGNOSIS — Z23 Encounter for immunization: Secondary | ICD-10-CM

## 2015-08-27 DIAGNOSIS — Z0000 Encounter for general adult medical examination without abnormal findings: Secondary | ICD-10-CM

## 2015-08-27 DIAGNOSIS — D649 Anemia, unspecified: Secondary | ICD-10-CM | POA: Diagnosis not present

## 2015-08-27 DIAGNOSIS — E876 Hypokalemia: Secondary | ICD-10-CM

## 2015-08-27 LAB — BASIC METABOLIC PANEL
BUN: 18 mg/dL (ref 7–25)
CO2: 25 mmol/L (ref 20–31)
Calcium: 9.5 mg/dL (ref 8.6–10.4)
Chloride: 104 mmol/L (ref 98–110)
Creat: 1.01 mg/dL — ABNORMAL HIGH (ref 0.50–0.99)
Glucose, Bld: 87 mg/dL (ref 70–99)
POTASSIUM: 3.5 mmol/L (ref 3.5–5.3)
SODIUM: 138 mmol/L (ref 135–146)

## 2015-08-27 LAB — IRON: IRON: 58 ug/dL (ref 45–160)

## 2015-08-27 LAB — CBC WITH DIFFERENTIAL/PLATELET
BASOS ABS: 0 10*3/uL (ref 0.0–0.1)
Basophils Relative: 0 % (ref 0–1)
Eosinophils Absolute: 0.4 10*3/uL (ref 0.0–0.7)
Eosinophils Relative: 5 % (ref 0–5)
HEMATOCRIT: 34.1 % — AB (ref 36.0–46.0)
HEMOGLOBIN: 11.1 g/dL — AB (ref 12.0–15.0)
LYMPHS PCT: 20 % (ref 12–46)
Lymphs Abs: 1.6 10*3/uL (ref 0.7–4.0)
MCH: 28.2 pg (ref 26.0–34.0)
MCHC: 32.6 g/dL (ref 30.0–36.0)
MCV: 86.8 fL (ref 78.0–100.0)
MPV: 10.3 fL (ref 8.6–12.4)
Monocytes Absolute: 0.6 10*3/uL (ref 0.1–1.0)
Monocytes Relative: 7 % (ref 3–12)
NEUTROS ABS: 5.5 10*3/uL (ref 1.7–7.7)
NEUTROS PCT: 68 % (ref 43–77)
Platelets: 246 10*3/uL (ref 150–400)
RBC: 3.93 MIL/uL (ref 3.87–5.11)
RDW: 14.3 % (ref 11.5–15.5)
WBC: 8.1 10*3/uL (ref 4.0–10.5)

## 2015-08-27 NOTE — Patient Instructions (Addendum)
Centrum Silver /One a day with iron for anemia We will call with lab results Prevnar 13- pneumonia shot given Bone Density  Soft Softener for your bowels  F/U 3 months

## 2015-08-27 NOTE — Progress Notes (Signed)
Patient ID: Victoria Mcconnell, female   DOB: [birth year], 69 y.o.   MRN: [id number] Subjective:   Patient presents for Medicare Annual/Subsequent preventive examination.    Her husband committed suicide back in Oct, she was given Xanax 1mg  as needed for her anxiety/grief. In general she is doing well. She has good days and bad days. She is staying with her sister. Her children are helping her. She sleeps well as long she has to Xanax. She still getting the financial affairs in order which is causing more stress at this time.  Vacations reviewed.   Review Past Medical/Family/Social: Per EMR   Risk Factors  Current exercise habits: walks Dietary issues discussed: Yes  Cardiac risk factors: Obesity (BMI >= 30 kg/m2). HTN, DM  Depression Screen  (Note: if answer to either of the following is "Yes", a more complete depression screening is indicated)  Over the past two weeks, have you felt down, depressed or hopeless? No Over the past two weeks, have you felt little interest or pleasure in doing things? No Have you lost interest or pleasure in daily life? No Do you often feel hopeless? No Do you cry easily over simple problems? No   Activities of Daily Living  In your present state of health, do you have any difficulty performing the following activities?:  Driving? No  Managing money? No  Feeding yourself? No  Getting from bed to chair? No  Climbing a flight of stairs? No  Preparing food and eating?: No  Bathing or showering? No  Getting dressed: No  Getting to the toilet? No  Using the toilet:No  Moving around from place to place: No  In the past year have you fallen or had a near fall?:No  Are you sexually active? No  Do you have more than one partner? No   Hearing Difficulties: No  Do you often ask people to speak up or repeat themselves? No  Do you experience ringing or noises in your ears? No Do you have difficulty understanding soft or whispered voices? No  Do you feel that  you have a problem with memory? No Do you often misplace items? No  Do you feel safe at home? Yes  Cognitive Testing  Alert? Yes Normal Appearance?Yes  Oriented to person? Yes Place? Yes  Time? Yes  Recall of three objects? Yes  Can perform simple calculations? Yes  Displays appropriate judgment?Yes  Can read the correct time from a watch face?Yes   List the Names of Other Physician/Practitioners you currently use:  Dr. Collene Mares- [facility]   Screening Tests / Date Colonoscopy     UTD                Zostavax UTD Mammogram UTD Influenza Vaccine UTD Tetanus/tdap - not covered Pneumonia- due for prevnar 13  ROS: GEN- denies fatigue, fever, weight loss,weakness, recent illness HEENT- denies eye drainage, change in vision, nasal discharge, CVS- denies chest pain, palpitations RESP- denies SOB, cough, wheeze ABD- denies N/V, change in stools, abd pain GU- denies dysuria, hematuria, dribbling, incontinence MSK- denies joint pain, muscle aches, injury Neuro- denies headache, dizziness, syncope, seizure activity  Physical: GEN- NAD, alert and oriented x3 HEENT- PERRL, EOMI, non injected sclera, pink conjunctiva, MMM, oropharynx clear Neck- Supple, no thryomegaly CVS- RRR, no murmur RESP-CTAB Psych- normal affect and mood, normal speech, no SI  EXT- No edema Pulses- Radial, DP- 2+    Assessment:    Annual wellness medicare exam   Plan:  During the course of the visit the patient was educated and counseled about appropriate screening and preventive services including:  Prevnar 13 given   Schedule Bone Density  Check labs for anemia, iron levels, check potassium   Screen NEG for depression. But being treated for grief, insomnia with xanax, will contact current medications.  Diet review for nutrition referral? Yes ____ Not Indicated __x__  Patient Instructions (the written plan) was given to the patient.  Medicare Attestation  I have personally reviewed:  The  patient's medical and social history  Their use of alcohol, tobacco or illicit drugs  Their current medications and supplements  The patient's functional ability including ADLs,fall risks, home safety risks, cognitive, and hearing and visual impairment  Diet and physical activities  Evidence for depression or mood disorders  The patient's weight, height, BMI, and visual acuity have been recorded in the chart. I have made referrals, counseling, and provided education to the patient based on review of the above and I have provided the patient with a written personalized care plan for preventive services.

## 2015-08-27 NOTE — Addendum Note (Signed)
Addended by: Sheral Flow on: 08/27/2015 11:54 AM   Modules accepted: Orders

## 2015-09-03 ENCOUNTER — Ambulatory Visit (HOSPITAL_COMMUNITY)
Admission: RE | Admit: 2015-09-03 | Discharge: 2015-09-03 | Disposition: A | Discharge status: Home / Self Care | Payer: Medicare Other | Source: Ambulatory Visit | Attending: Family Medicine | Admitting: Family Medicine

## 2015-09-03 DIAGNOSIS — Z780 Asymptomatic menopausal state: Secondary | ICD-10-CM | POA: Insufficient documentation

## 2015-09-03 DIAGNOSIS — Z13820 Encounter for screening for osteoporosis: Secondary | ICD-10-CM | POA: Diagnosis not present

## 2015-09-03 HISTORY — DX: Other specified disorders of bone density and structure, unspecified site: M85.80

## 2015-09-04 ENCOUNTER — Encounter: Payer: Self-pay | Admitting: *Deleted

## 2015-09-04 ENCOUNTER — Encounter (HOSPITAL_COMMUNITY): Payer: Self-pay

## 2015-09-10 ENCOUNTER — Telehealth: Payer: Self-pay | Admitting: *Deleted

## 2015-09-10 NOTE — Telephone Encounter (Signed)
Patient has questions about her bill.   Please contact her at [phone] (H).

## 2015-09-20 ENCOUNTER — Other Ambulatory Visit: Payer: Self-pay | Admitting: Family Medicine

## 2015-09-20 NOTE — Telephone Encounter (Signed)
Refill appropriate and filled per protocol. 

## 2015-10-01 DIAGNOSIS — H524 Presbyopia: Secondary | ICD-10-CM | POA: Diagnosis not present

## 2015-10-01 LAB — HM DIABETES EYE EXAM

## 2015-11-26 ENCOUNTER — Other Ambulatory Visit: Payer: Self-pay | Admitting: Family Medicine

## 2015-11-26 ENCOUNTER — Ambulatory Visit (INDEPENDENT_AMBULATORY_CARE_PROVIDER_SITE_OTHER): Payer: Medicare Other | Admitting: Family Medicine

## 2015-11-26 ENCOUNTER — Encounter: Payer: Self-pay | Admitting: Family Medicine

## 2015-11-26 VITALS — BP 128/68 | HR 80 | Temp 98.6°F | Resp 14 | Ht 68.0 in | Wt 207.0 lb

## 2015-11-26 DIAGNOSIS — E119 Type 2 diabetes mellitus without complications: Secondary | ICD-10-CM

## 2015-11-26 DIAGNOSIS — E785 Hyperlipidemia, unspecified: Secondary | ICD-10-CM

## 2015-11-26 DIAGNOSIS — F4321 Adjustment disorder with depressed mood: Secondary | ICD-10-CM | POA: Diagnosis not present

## 2015-11-26 DIAGNOSIS — I10 Essential (primary) hypertension: Secondary | ICD-10-CM

## 2015-11-26 DIAGNOSIS — E669 Obesity, unspecified: Secondary | ICD-10-CM | POA: Diagnosis not present

## 2015-11-26 LAB — CBC WITH DIFFERENTIAL/PLATELET
BASOS ABS: 0.1 10*3/uL (ref 0.0–0.1)
Basophils Relative: 1 % (ref 0–1)
EOS ABS: 0.3 10*3/uL (ref 0.0–0.7)
EOS PCT: 4 % (ref 0–5)
HCT: 33.2 % — ABNORMAL LOW (ref 36.0–46.0)
Hemoglobin: 11.2 g/dL — ABNORMAL LOW (ref 12.0–15.0)
Lymphocytes Relative: 23 % (ref 12–46)
Lymphs Abs: 2 10*3/uL (ref 0.7–4.0)
MCH: 29.2 pg (ref 26.0–34.0)
MCHC: 33.7 g/dL (ref 30.0–36.0)
MCV: 86.5 fL (ref 78.0–100.0)
MPV: 10.4 fL (ref 8.6–12.4)
Monocytes Absolute: 0.5 10*3/uL (ref 0.1–1.0)
Monocytes Relative: 6 % (ref 3–12)
Neutro Abs: 5.7 10*3/uL (ref 1.7–7.7)
Neutrophils Relative %: 66 % (ref 43–77)
PLATELETS: 266 10*3/uL (ref 150–400)
RBC: 3.84 MIL/uL — AB (ref 3.87–5.11)
RDW: 14.4 % (ref 11.5–15.5)
WBC: 8.7 10*3/uL (ref 4.0–10.5)

## 2015-11-26 LAB — COMPREHENSIVE METABOLIC PANEL
ALK PHOS: 64 U/L (ref 33–130)
ALT: 24 U/L (ref 6–29)
AST: 21 U/L (ref 10–35)
Albumin: 4.2 g/dL (ref 3.6–5.1)
BILIRUBIN TOTAL: 0.4 mg/dL (ref 0.2–1.2)
BUN: 19 mg/dL (ref 7–25)
CO2: 26 mmol/L (ref 20–31)
Calcium: 10.3 mg/dL (ref 8.6–10.4)
Chloride: 105 mmol/L (ref 98–110)
Creat: 1.09 mg/dL — ABNORMAL HIGH (ref 0.50–0.99)
GLUCOSE: 96 mg/dL (ref 70–99)
POTASSIUM: 4 mmol/L (ref 3.5–5.3)
Sodium: 141 mmol/L (ref 135–146)
Total Protein: 7.3 g/dL (ref 6.1–8.1)

## 2015-11-26 LAB — LIPID PANEL
CHOL/HDL RATIO: 1.9 ratio (ref <=5.0)
Cholesterol: 142 mg/dL (ref 125–200)
HDL: 76 mg/dL (ref >=46)
LDL CALC: 51 mg/dL (ref <130)
TRIGLYCERIDES: 75 mg/dL (ref <150)
VLDL: 15 mg/dL (ref <30)

## 2015-11-26 MED ORDER — ALPRAZOLAM 1 MG PO TABS: 1.0000 mg | ORAL_TABLET | Freq: Two times a day (BID) | ORAL | Status: DC | PRN

## 2015-11-26 NOTE — Assessment & Plan Note (Signed)
Blood sugar has been well controlled. We'll recheck her A1c will may be able to decrease her metformin to 500 twice a day

## 2015-11-26 NOTE — Assessment & Plan Note (Signed)
Continue with Xanax she does have a very supportive family.

## 2015-11-26 NOTE — Telephone Encounter (Signed)
Medication refilled per protocol. 

## 2015-11-26 NOTE — Assessment & Plan Note (Signed)
Blood pressure well controlled medication medication 

## 2015-11-26 NOTE — Assessment & Plan Note (Signed)
She is currently on Lipitor check of her function as well as cholesterol

## 2015-11-26 NOTE — Patient Instructions (Signed)
Release of records- Dr.Shaprio- last Eye visit  We will call with lab results F/U 4 months

## 2015-11-26 NOTE — Progress Notes (Signed)
Patient ID: Victoria Mcconnell, female   DOB: 27-Jul-1946, 70 y.o.   MRN: [id number]    Subjective:    Patient ID: Victoria Mcconnell, female    DOB: December 30, 1945, 70 y.o.   MRN: [id number]  Patient presents for 4 month F/U Patient here to follow-up chronic medical problems. She has no new concerns today. She is moved in with her sister she has difficulty living in the house where her husband recently passed away. She still uses Xanax as needed if she is still going through the financial issues from his recent passing. She does have a lot of support from her family.  Diabetes mellitus last A1c was 6.1% her blood sugars range 80-100 fasting no hypoglycemia symptoms.  She has seen her ophthalmologist stated to her glaucoma is well controlled.  Medications reviewed    Review Of Systems:  GEN- denies fatigue, fever, weight loss,weakness, recent illness HEENT- denies eye drainage, change in vision, nasal discharge, CVS- denies chest pain, palpitations RESP- denies SOB, cough, wheeze ABD- denies N/V, change in stools, abd pain GU- denies dysuria, hematuria, dribbling, incontinence MSK- denies joint pain, muscle aches, injury Neuro- denies headache, dizziness, syncope, seizure activity       Objective:    BP 128/68 mmHg  Pulse 80  Temp(Src) 98.6 F (37 C) (Oral)  Resp 14  Ht 5\' 8"  (1.727 m)  Wt 207 lb (93.895 kg)  BMI 31.48 kg/m2 GEN- NAD, alert and oriented x3 HEENT- PERRL, EOMI, non injected sclera, pink conjunctiva, MMM, oropharynx clear CVS- RRR, no murmur RESP-CTAB ABD-NABS,soft,NT,ND Psych- normal affect and mood  EXT- No edema Pulses- Radial, DP- 2+        Assessment & Plan:      Problem List Items Addressed This Visit    Obesity   Hyperlipidemia    She is currently on Lipitor check of her function as well as cholesterol      Relevant Orders   Lipid panel   Grief reaction    Continue with Xanax she does have a very supportive family.      Essential hypertension,  benign    Blood pressure well controlled medication medication      Diabetes mellitus type 2, controlled ([facility]) - Primary    Blood sugar has been well controlled. We'll recheck her A1c will may be able to decrease her metformin to 500 twice a day      Relevant Orders   CBC with Differential/Platelet   Comprehensive metabolic panel   Hemoglobin A1c      Note: This dictation was prepared with Dragon dictation along with smaller phrase technology. Any transcriptional errors that result from this process are unintentional.

## 2015-11-27 LAB — HEMOGLOBIN A1C
Hgb A1c MFr Bld: 5.9 % — ABNORMAL HIGH (ref <5.7)
Mean Plasma Glucose: 123 mg/dL — ABNORMAL HIGH (ref <117)

## 2015-12-17 DIAGNOSIS — H401131 Primary open-angle glaucoma, bilateral, mild stage: Secondary | ICD-10-CM | POA: Diagnosis not present

## 2015-12-26 ENCOUNTER — Other Ambulatory Visit: Payer: Self-pay | Admitting: *Deleted

## 2015-12-26 MED ORDER — GLUCOSE BLOOD VI STRP: ORAL_STRIP | Status: DC

## 2015-12-26 NOTE — Telephone Encounter (Signed)
Received fax requesting refill on DM test strips.   Refill appropriate and filled per protocol.

## 2016-02-12 ENCOUNTER — Encounter: Payer: Self-pay | Admitting: Family Medicine

## 2016-02-13 ENCOUNTER — Other Ambulatory Visit: Payer: Self-pay | Admitting: Family Medicine

## 2016-02-13 NOTE — Telephone Encounter (Signed)
Refill appropriate and filled per protocol. 

## 2016-03-24 DIAGNOSIS — H401131 Primary open-angle glaucoma, bilateral, mild stage: Secondary | ICD-10-CM | POA: Diagnosis not present

## 2016-03-27 ENCOUNTER — Ambulatory Visit (INDEPENDENT_AMBULATORY_CARE_PROVIDER_SITE_OTHER): Payer: Medicare Other | Admitting: Family Medicine

## 2016-03-27 ENCOUNTER — Encounter: Payer: Self-pay | Admitting: Family Medicine

## 2016-03-27 VITALS — BP 120/74 | HR 82 | Temp 98.9°F | Resp 14 | Ht 68.0 in | Wt 205.0 lb

## 2016-03-27 DIAGNOSIS — I10 Essential (primary) hypertension: Secondary | ICD-10-CM | POA: Diagnosis not present

## 2016-03-27 DIAGNOSIS — M150 Primary generalized (osteo)arthritis: Secondary | ICD-10-CM | POA: Diagnosis not present

## 2016-03-27 DIAGNOSIS — M159 Polyosteoarthritis, unspecified: Secondary | ICD-10-CM

## 2016-03-27 DIAGNOSIS — M8949 Other hypertrophic osteoarthropathy, multiple sites: Secondary | ICD-10-CM

## 2016-03-27 DIAGNOSIS — E119 Type 2 diabetes mellitus without complications: Secondary | ICD-10-CM | POA: Diagnosis not present

## 2016-03-27 LAB — CBC WITH DIFFERENTIAL/PLATELET
Basophils Absolute: 83 cells/uL (ref 0–200)
Basophils Relative: 1 %
EOS ABS: 332 {cells}/uL (ref 15–500)
Eosinophils Relative: 4 %
HCT: 34 % — ABNORMAL LOW (ref 35.0–45.0)
Hemoglobin: 11.2 g/dL — ABNORMAL LOW (ref 12.0–15.0)
LYMPHS PCT: 21 %
Lymphs Abs: 1743 cells/uL (ref 850–3900)
MCH: 29 pg (ref 27.0–33.0)
MCHC: 32.9 g/dL (ref 32.0–36.0)
MCV: 88.1 fL (ref 80.0–100.0)
MONOS PCT: 7 %
MPV: 10.2 fL (ref 7.5–12.5)
Monocytes Absolute: 581 cells/uL (ref 200–950)
Neutro Abs: 5561 cells/uL (ref 1500–7800)
Neutrophils Relative %: 67 %
PLATELETS: 253 10*3/uL (ref 140–400)
RBC: 3.86 MIL/uL (ref 3.80–5.10)
RDW: 14.8 % (ref 11.0–15.0)
WBC: 8.3 10*3/uL (ref 3.8–10.8)

## 2016-03-27 LAB — COMPREHENSIVE METABOLIC PANEL
ALT: 15 U/L (ref 6–29)
AST: 19 U/L (ref 10–35)
Albumin: 4.2 g/dL (ref 3.6–5.1)
Alkaline Phosphatase: 54 U/L (ref 33–130)
BUN: 17 mg/dL (ref 7–25)
CHLORIDE: 103 mmol/L (ref 98–110)
CO2: 25 mmol/L (ref 20–31)
Calcium: 10.3 mg/dL (ref 8.6–10.4)
Creat: 0.98 mg/dL (ref 0.50–0.99)
GLUCOSE: 99 mg/dL (ref 70–99)
POTASSIUM: 3.9 mmol/L (ref 3.5–5.3)
Sodium: 138 mmol/L (ref 135–146)
Total Bilirubin: 0.3 mg/dL (ref 0.2–1.2)
Total Protein: 7.6 g/dL (ref 6.1–8.1)

## 2016-03-27 LAB — HEMOGLOBIN A1C
Hgb A1c MFr Bld: 6 % — ABNORMAL HIGH (ref <5.7)
Mean Plasma Glucose: 126 mg/dL

## 2016-03-27 MED ORDER — ATORVASTATIN CALCIUM 20 MG PO TABS: ORAL_TABLET | ORAL | Status: DC

## 2016-03-27 MED ORDER — MONTELUKAST SODIUM 10 MG PO TABS: ORAL_TABLET | ORAL | Status: DC

## 2016-03-27 MED ORDER — ALPRAZOLAM 1 MG PO TABS: 1.0000 mg | ORAL_TABLET | Freq: Two times a day (BID) | ORAL | Status: DC | PRN

## 2016-03-27 MED ORDER — AMLODIPINE BESYLATE 5 MG PO TABS
ORAL_TABLET | ORAL | Status: DC
Start: 2016-03-27 — End: 2016-12-15

## 2016-03-27 MED ORDER — VALSARTAN-HYDROCHLOROTHIAZIDE 160-25 MG PO TABS: ORAL_TABLET | ORAL | Status: DC

## 2016-03-27 NOTE — Assessment & Plan Note (Signed)
Blood pressure well controlled no change in medication 

## 2016-03-27 NOTE — Patient Instructions (Signed)
Continue current medications Okay to take 1/2 xanax  F/U Dec for Physical

## 2016-03-27 NOTE — Assessment & Plan Note (Signed)
Diabetes has been well-controlled having decreasing her metformin if her A1c is still rales 6% or less I will consider discontinuing the metformin altogether

## 2016-03-27 NOTE — Assessment & Plan Note (Signed)
She has known osteoarthritis in her spine as well as her hips she continues to stay active advised she can use topical arthritis creams or occasional anti-inflammatory

## 2016-03-27 NOTE — Progress Notes (Signed)
Patient ID: Victoria Mcconnell, female   DOB: [birth year], 70 y.o.   MRN: [id number]    Subjective:    Patient ID: Victoria Mcconnell, female    DOB: 12-09-1945, 70 y.o.   MRN: [id number]  Patient presents for 4 month F/U  Pt here to f/u chronic medical problems, Medications reviewed DM- last A1C 5.9%, Metformin was decreased to 500mg  twice a day, she is monitoring diet Home CBG readings 93-105  no hypoglycemia. LDL at goal of 51 with lipitor    Taking BP medication as prescribed, no concerns    She does get some lower back pain and left-sided discomfort on and off typically she states if she is sitting for a period of time but when she stretches or moves around and is much improved. She denies any tingling numbness in her lower extremities it is not last long enough to take any medication  Review Of Systems:  GEN- denies fatigue, fever, weight loss,weakness, recent illness HEENT- denies eye drainage, change in vision, nasal discharge, CVS- denies chest pain, palpitations RESP- denies SOB, cough, wheeze ABD- denies N/V, change in stools, abd pain GU- denies dysuria, hematuria, dribbling, incontinence MSK- + joint pain, muscle aches, injury Neuro- denies headache, dizziness, syncope, seizure activity       Objective:    BP 120/74 mmHg  Pulse 82  Temp(Src) 98.9 F (37.2 C) (Oral)  Resp 14  Ht 5\' 8"  (1.727 m)  Wt 205 lb (92.987 kg)  BMI 31.18 kg/m2 GEN- NAD, alert and oriented x3 HEENT- PERRL, EOMI, non injected sclera, pink conjunctiva, MMM, oropharynx clear CVS- RRR, no murmur RESP-CTAB MSK- Spine NT, fair ROM, fair ROM bilat hips, fair ROM bilat knees, +crepitus, neg SLR, non antalgic gait EXT- No edema Pulses- Radial 2+        Assessment & Plan:      Problem List Items Addressed This Visit    OA (osteoarthritis)    She has known osteoarthritis in her spine as well as her hips she continues to stay active advised she can use topical arthritis creams or occasional  anti-inflammatory      Essential hypertension, benign - Primary    Blood pressure well controlled no change in medication      Relevant Medications   atorvastatin (LIPITOR) 20 MG tablet   amLODipine (NORVASC) 5 MG tablet   valsartan-hydrochlorothiazide (DIOVAN-HCT) 160-25 MG tablet   Other Relevant Orders   CBC with Differential/Platelet   Comprehensive metabolic panel   Diabetes mellitus type 2, controlled ([facility])    Diabetes has been well-controlled having decreasing her metformin if her A1c is still rales 6% or less I will consider discontinuing the metformin altogether      Relevant Medications   atorvastatin (LIPITOR) 20 MG tablet   valsartan-hydrochlorothiazide (DIOVAN-HCT) 160-25 MG tablet   Other Relevant Orders   Hemoglobin A1c      Note: This dictation was prepared with Dragon dictation along with smaller phrase technology. Any transcriptional errors that result from this process are unintentional.

## 2016-04-29 ENCOUNTER — Other Ambulatory Visit: Payer: Self-pay | Admitting: Family Medicine

## 2016-05-04 DIAGNOSIS — L249 Irritant contact dermatitis, unspecified cause: Secondary | ICD-10-CM | POA: Diagnosis not present

## 2016-05-04 DIAGNOSIS — L700 Acne vulgaris: Secondary | ICD-10-CM | POA: Diagnosis not present

## 2016-05-04 DIAGNOSIS — L669 Cicatricial alopecia, unspecified: Secondary | ICD-10-CM | POA: Diagnosis not present

## 2016-05-04 DIAGNOSIS — L818 Other specified disorders of pigmentation: Secondary | ICD-10-CM | POA: Diagnosis not present

## 2016-05-04 DIAGNOSIS — L680 Hirsutism: Secondary | ICD-10-CM | POA: Diagnosis not present

## 2016-05-04 DIAGNOSIS — L731 Pseudofolliculitis barbae: Secondary | ICD-10-CM | POA: Diagnosis not present

## 2016-06-10 ENCOUNTER — Other Ambulatory Visit: Payer: Self-pay | Admitting: Family Medicine

## 2016-06-10 DIAGNOSIS — Z1231 Encounter for screening mammogram for malignant neoplasm of breast: Secondary | ICD-10-CM

## 2016-07-06 DIAGNOSIS — H401131 Primary open-angle glaucoma, bilateral, mild stage: Secondary | ICD-10-CM | POA: Diagnosis not present

## 2016-07-08 ENCOUNTER — Ambulatory Visit (HOSPITAL_COMMUNITY)
Admission: RE | Admit: 2016-07-08 | Discharge: 2016-07-08 | Disposition: A | Discharge status: Home / Self Care | Payer: Medicare Other | Source: Ambulatory Visit | Attending: Family Medicine | Admitting: Family Medicine

## 2016-07-08 DIAGNOSIS — Z1231 Encounter for screening mammogram for malignant neoplasm of breast: Secondary | ICD-10-CM | POA: Diagnosis not present

## 2016-08-12 ENCOUNTER — Other Ambulatory Visit: Payer: Self-pay | Admitting: Family Medicine

## 2016-08-13 NOTE — Telephone Encounter (Signed)
Medication called to pharmacy. 

## 2016-08-13 NOTE — Telephone Encounter (Signed)
Approved. #[id number].

## 2016-08-13 NOTE — Telephone Encounter (Signed)
Ok to refill 

## 2016-09-02 ENCOUNTER — Ambulatory Visit (INDEPENDENT_AMBULATORY_CARE_PROVIDER_SITE_OTHER): Payer: Medicare Other | Admitting: Family Medicine

## 2016-09-02 ENCOUNTER — Other Ambulatory Visit: Payer: Self-pay | Admitting: Family Medicine

## 2016-09-02 ENCOUNTER — Encounter: Payer: Self-pay | Admitting: Family Medicine

## 2016-09-02 VITALS — BP 122/68 | HR 96 | Temp 98.2°F | Resp 14 | Ht 68.0 in | Wt 196.0 lb

## 2016-09-02 DIAGNOSIS — I10 Essential (primary) hypertension: Secondary | ICD-10-CM | POA: Diagnosis not present

## 2016-09-02 DIAGNOSIS — M150 Primary generalized (osteo)arthritis: Secondary | ICD-10-CM

## 2016-09-02 DIAGNOSIS — M159 Polyosteoarthritis, unspecified: Secondary | ICD-10-CM

## 2016-09-02 DIAGNOSIS — E119 Type 2 diabetes mellitus without complications: Secondary | ICD-10-CM | POA: Diagnosis not present

## 2016-09-02 DIAGNOSIS — Z0000 Encounter for general adult medical examination without abnormal findings: Secondary | ICD-10-CM | POA: Diagnosis not present

## 2016-09-02 DIAGNOSIS — J069 Acute upper respiratory infection, unspecified: Secondary | ICD-10-CM | POA: Diagnosis not present

## 2016-09-02 DIAGNOSIS — B9789 Other viral agents as the cause of diseases classified elsewhere: Secondary | ICD-10-CM | POA: Diagnosis not present

## 2016-09-02 DIAGNOSIS — M75102 Unspecified rotator cuff tear or rupture of left shoulder, not specified as traumatic: Secondary | ICD-10-CM | POA: Diagnosis not present

## 2016-09-02 DIAGNOSIS — M8949 Other hypertrophic osteoarthropathy, multiple sites: Secondary | ICD-10-CM

## 2016-09-02 DIAGNOSIS — E039 Hypothyroidism, unspecified: Secondary | ICD-10-CM | POA: Diagnosis not present

## 2016-09-02 LAB — COMPREHENSIVE METABOLIC PANEL
ALT: 13 U/L (ref 6–29)
AST: 17 U/L (ref 10–35)
Albumin: 4 g/dL (ref 3.6–5.1)
Alkaline Phosphatase: 54 U/L (ref 33–130)
BILIRUBIN TOTAL: 0.4 mg/dL (ref 0.2–1.2)
BUN: 12 mg/dL (ref 7–25)
CALCIUM: 10 mg/dL (ref 8.6–10.4)
CO2: 25 mmol/L (ref 20–31)
CREATININE: 1.05 mg/dL — AB (ref 0.50–0.99)
Chloride: 106 mmol/L (ref 98–110)
GLUCOSE: 95 mg/dL (ref 70–99)
Potassium: 3.6 mmol/L (ref 3.5–5.3)
SODIUM: 140 mmol/L (ref 135–146)
Total Protein: 6.7 g/dL (ref 6.1–8.1)

## 2016-09-02 LAB — CBC WITH DIFFERENTIAL/PLATELET
BASOS PCT: 1 %
Basophils Absolute: 98 cells/uL (ref 0–200)
EOS PCT: 5 %
Eosinophils Absolute: 490 cells/uL (ref 15–500)
HCT: 33 % — ABNORMAL LOW (ref 35.0–45.0)
Hemoglobin: 10.8 g/dL — ABNORMAL LOW (ref 12.0–15.0)
LYMPHS PCT: 18 %
Lymphs Abs: 1764 cells/uL (ref 850–3900)
MCH: 29 pg (ref 27.0–33.0)
MCHC: 32.7 g/dL (ref 32.0–36.0)
MCV: 88.7 fL (ref 80.0–100.0)
MONOS PCT: 8 %
MPV: 9.9 fL (ref 7.5–12.5)
Monocytes Absolute: 784 cells/uL (ref 200–950)
NEUTROS PCT: 68 %
Neutro Abs: 6664 cells/uL (ref 1500–7800)
PLATELETS: 262 10*3/uL (ref 140–400)
RBC: 3.72 MIL/uL — AB (ref 3.80–5.10)
RDW: 14.2 % (ref 11.0–15.0)
WBC: 9.8 10*3/uL (ref 3.8–10.8)

## 2016-09-02 LAB — LIPID PANEL
CHOL/HDL RATIO: 1.9 ratio (ref <5.0)
CHOLESTEROL: 132 mg/dL (ref <200)
HDL: 68 mg/dL (ref >50)
LDL CALC: 48 mg/dL (ref <100)
Triglycerides: 82 mg/dL (ref <150)
VLDL: 16 mg/dL (ref <30)

## 2016-09-02 NOTE — Patient Instructions (Addendum)
We will send the tetanus to see if covered Use honey/lemon, salt  Water gargle Take aleve twice a day with food  Do the exercises for you shoulder  Referral to orthopedics [facility] We will call with lab results  F/U 4 months

## 2016-09-02 NOTE — Progress Notes (Signed)
Subjective:   Patient presents for Medicare Annual/Subsequent preventive examination.   Sore throat, cough with mucous for past 3 days, no fever, no SOB  Bilat shoulder pain upper back, no injury, has known OA, had injury to left shoulder in the past, takes Aleve which helps some   Medications reviewed   DM- last A1C 6%, Her metformin was stopped at the last visit however in September she noted that her blood sugars were going up she had some 130s and 150s therefore she restarted that metformin twice a day she has not had any hypoglycemia symptoms since then. Her blood sugars are the past month have ranged from 90 to low 120s  Review Past Medical/Family/Social: Per EMR    Risk Factors  Current exercise habits: walks Dietary issues discussed: Weight down 9lbs   Cardiac risk factors: DM, HTN, hyperlipidemia   Depression Screen  (Note: if answer to either of the following is "Yes", a more complete depression screening is indicated)  Over the past two weeks, have you felt down, depressed or hopeless? No Over the past two weeks, have you felt little interest or pleasure in doing things? No Have you lost interest or pleasure in daily life? No Do you often feel hopeless? No Do you cry easily over simple problems? No   Activities of Daily Living  In your present state of health, do you have any difficulty performing the following activities?:  Driving? No  Managing money? No  Feeding yourself? No  Getting from bed to chair? No  Climbing a flight of stairs? No  Preparing food and eating?: No  Bathing or showering? No  Getting dressed: No  Getting to the toilet? No  Using the toilet:No  Moving around from place to place: No  In the past year have you fallen or had a near fall?:No  Are you sexually active? No  Do you have more than one partner? No   Hearing Difficulties: yes Do you often ask people to speak up or repeat themselves? yes Do you experience ringing or noises in your  ears? No Do you have difficulty understanding soft or whispered voices? No  Do you feel that you have a problem with memory? No Do you often misplace items? No  Do you feel safe at home? Yes  Cognitive Testing  Alert? Yes Normal Appearance?Yes  Oriented to person? Yes Place? Yes  Time? Yes  Recall of three objects? Yes  Can perform simple calculations? Yes  Displays appropriate judgment?Yes  Can read the correct time from a watch face?Yes   List the Names of Other Physician/Practitioners you currently use:  Eye doctor    Screening Tests / Date Colonoscopy   UTD                  Zostavax UTD  Mammogram UTD  Influenza Vaccine UTD  Tetanus/tdap - Due  Pneumonia- UTD  GEN- denies fatigue, fever, weight loss,weakness, recent illness HEENT- denies eye drainage, change in vision, nasal discharge, CVS- denies chest pain, palpitations RESP- denies SOB, cough, wheeze ABD- denies N/V, change in stools, abd pain GU- denies dysuria, hematuria, dribbling, incontinence MSK- +joint pain, muscle aches, injury Neuro- denies headache, dizziness, syncope, seizure activity  Physical:  GEN- NAD, alert and oriented x3 HEENT- PERRL, EOMI, non injected sclera, pink conjunctiva, MMM, oropharynx clear,nares clear rhinorrhea, no maxillary sinus tenderness, TM clear no effusion Neck- Supple, no thryomegaly, no LAD , good ROM neck CVS- RRR, no murmur RESP-CTAB ABD-NABS,soft,NT,ND  MSK- fair  ROM bilat upper ext/shoulders, equicical empty can left side, biceps in tact, no impingment signs  EXT- No edema Pulses- Radial, DP- 2+     Assessment:    Annual wellness medicare exam   Plan:    During the course of the visit the patient was educated and counseled about appropriate screening and preventive services including:   TDAP  Prescription given to that she can get the vaccine at the pharmacy or Medicare part D.  Screen NEG  for depression.   Refer to ortho, has OA but rotator cuff syndrome  left side, previous injury, for now take aleve, can use topical biofreeze, given exercises  Viral URI- mild cold, OTC remedies, throat lozenges, no red flags   Diet review for nutrition referral? Yes ____ Not Indicated __x__  Patient Instructions (the written plan) was given to the patient.  Medicare Attestation  I have personally reviewed:  The patient's medical and social history  Their use of alcohol, tobacco or illicit drugs  Their current medications and supplements  The patient's functional ability including ADLs,fall risks, home safety risks, cognitive, and hearing and visual impairment  Diet and physical activities  Evidence for depression or mood disorders  The patient's weight, height, BMI, and visual acuity have been recorded in the chart. I have made referrals, counseling, and provided education to the patient based on review of the above and I have provided the patient with a written personalized care plan for preventive services.

## 2016-09-03 ENCOUNTER — Other Ambulatory Visit: Payer: Self-pay | Admitting: *Deleted

## 2016-09-03 LAB — HEMOGLOBIN A1C
Hgb A1c MFr Bld: 5.9 % — ABNORMAL HIGH (ref <5.7)
Mean Plasma Glucose: 123 mg/dL

## 2016-09-03 MED ORDER — TETANUS-DIPHTH-ACELL PERTUSSIS 5-2-15.5 LF-MCG/0.5 IM SUSP: 0.5000 mL | Freq: Once | INTRAMUSCULAR | 0 refills | Status: AC

## 2016-09-03 NOTE — Assessment & Plan Note (Signed)
DM I dont think she needs the high dose of MTF, repeat A1C,  plan to at least decrease, likley doesn't need more than 500mg  once a day based on readings and her age

## 2016-09-03 NOTE — Assessment & Plan Note (Signed)
Controlled no changes Fasting labs today  

## 2016-09-04 LAB — IRON AND TIBC
%SAT: 17 % (ref 11–50)
Iron: 51 ug/dL (ref 45–160)
TIBC: 293 ug/dL (ref 250–450)
UIBC: 242 ug/dL (ref 125–400)

## 2016-09-04 LAB — FERRITIN: FERRITIN: 67 ng/mL (ref 20–288)

## 2016-09-04 LAB — FOLATE: Folate: 23.4 ng/mL (ref >5.4)

## 2016-09-09 ENCOUNTER — Telehealth: Payer: Self-pay

## 2016-09-09 MED ORDER — TETANUS-DIPHTH-ACELL PERTUSSIS 5-2.5-18.5 LF-MCG/0.5 IM SUSP: 0.5000 mL | Freq: Once | INTRAMUSCULAR | 0 refills | Status: AC

## 2016-09-09 MED ORDER — AMOXICILLIN 875 MG PO TABS: 875.0000 mg | ORAL_TABLET | Freq: Two times a day (BID) | ORAL | 0 refills | Status: DC

## 2016-09-09 NOTE — Telephone Encounter (Signed)
Called patient. Gave lab results. Patient verbalized understanding.  Patient states she would like a Rx for her Tetanus injection to be sent to the pharmacy. She wanted the MD to know she is still having a lot of head congestion/drainage also.

## 2016-09-09 NOTE — Telephone Encounter (Signed)
-----   Message from Alycia Rossetti, MD sent at 09/08/2016  8:38 PM EST ----- Call pt  A1C looks excellent, decrease metformin to 500mg  twice a day for now  Normocytic anemia which is chronic, her  anemia panel is normal She can continue to eat healthy diet, take MVI Cholesterol is normal No other changes

## 2016-09-09 NOTE — Telephone Encounter (Signed)
Okay to send in TDAP to pharmacy Also for congestion, it has been 10 days based on last visit, may have mild sinusitis Start amoxicllin 875mg  BID for 7 days - send script  She can take mucinex as well and use nasal saline spray OTC

## 2016-09-09 NOTE — Telephone Encounter (Signed)
Spoke to patient. Advised sent Rx's. Went over instructions. Patient verbalized understanding.

## 2016-09-09 NOTE — Addendum Note (Signed)
Addended by: Milta Deiters on: 09/09/2016 11:09 AM   Modules accepted: Orders

## 2016-09-16 MED ORDER — TETANUS-DIPHTH-ACELL PERTUSSIS 5-2-15.5 LF-MCG/0.5 IM SUSP: 0.5000 mL | Freq: Once | INTRAMUSCULAR | 0 refills | Status: AC

## 2016-09-16 NOTE — Addendum Note (Signed)
Addended by: Shary Decamp B on: 09/16/2016 12:09 PM   Modules accepted: Orders

## 2016-09-25 ENCOUNTER — Ambulatory Visit (INDEPENDENT_AMBULATORY_CARE_PROVIDER_SITE_OTHER): Payer: Medicare Other

## 2016-09-25 ENCOUNTER — Encounter (INDEPENDENT_AMBULATORY_CARE_PROVIDER_SITE_OTHER): Payer: Self-pay | Admitting: Orthopedic Surgery

## 2016-09-25 ENCOUNTER — Ambulatory Visit (INDEPENDENT_AMBULATORY_CARE_PROVIDER_SITE_OTHER): Payer: Medicare Other | Admitting: Orthopedic Surgery

## 2016-09-25 VITALS — Ht 68.0 in | Wt 196.0 lb

## 2016-09-25 DIAGNOSIS — G8929 Other chronic pain: Secondary | ICD-10-CM

## 2016-09-25 DIAGNOSIS — M25511 Pain in right shoulder: Secondary | ICD-10-CM | POA: Diagnosis not present

## 2016-09-25 DIAGNOSIS — M25512 Pain in left shoulder: Secondary | ICD-10-CM

## 2016-09-25 MED ORDER — METHYLPREDNISOLONE ACETATE 40 MG/ML IJ SUSP
40.0000 mg | INTRAMUSCULAR | Status: AC | PRN
  Administered 2016-09-25: 40 mg via INTRA_ARTICULAR

## 2016-09-25 MED ORDER — BUPIVACAINE HCL 0.5 % IJ SOLN
9.0000 mL | INTRAMUSCULAR | Status: AC | PRN
  Administered 2016-09-25: 9 mL via INTRA_ARTICULAR

## 2016-09-25 MED ORDER — LIDOCAINE HCL 1 % IJ SOLN
5.0000 mL | INTRAMUSCULAR | Status: AC | PRN
  Administered 2016-09-25: 5 mL

## 2016-09-25 NOTE — Progress Notes (Signed)
Office Visit Note   Patient: Victoria Mcconnell           Date of Birth: Apr 16, 1946           MRN: [id number] Visit Date: 09/25/2016 Requested by: Alycia Rossetti, MD [address] [place], [place] [address] PCP: Vic Blackbird, MD  Subjective: Chief Complaint  Patient presents with  . Right Shoulder - Pain  . Left Shoulder - Pain    HPI Victoria Mcconnell is a 71 year old patient with bilateral shoulder pain.  The right shoulder than hurting her since 2015.  She's fallen 2 times and describes a long history of aching pain in that right shoulder.  Denies any neck pain or radicular symptoms.  Left shoulder and hurting since December 2017 but she does describe falling down the steps about 6 months ago in that left shoulder initially started hurting.  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  Denies much in way of popping or grinding but she does report weakness in both arms.  The pain does interfere with her sleep on the left-hand side.  She's not smoker.              Review of Systems All systems reviewed are negative as they relate to the chief complaint within the history of present illness.  Patient denies  fevers or chills.    Assessment & Plan: Visit Diagnoses:  1. Chronic right shoulder pain   2. Acute pain of left shoulder     Plan: Impression is bilateral shoulder pain left worse than right.  She has fairly profound weakness to rotator cuff testing on both sides.  For flexion and abduction above 90 is difficult for her.  She has good cervical spine range of motion no evidence of referred pain from the neck.  At this time I like to inject the left shoulder and see her back in 4-6 weeks to decide then for or against MRI scanning of the left shoulder.  I think she likely has chronic retracted rotator cuff tears in both shoulders.  He not be quite as bad on the left than the right.  We'll see her back and decide for or against MRI scanning on the left shoulder in 4-6 weeks.  Follow-Up  Instructions: Return in about 6 weeks (around 11/06/2016).   Orders:  Orders Placed This Encounter  Procedures  . XR Shoulder Left  . XR Shoulder Right   No orders of the defined types were placed in this encounter.     Procedures: Large Joint Inj Date/Time: 09/25/2016 5:30 PM Performed by: Meredith Pel Authorized by: Meredith Pel   Consent Given by:  Patient Site marked: the procedure site was marked   Timeout: prior to procedure the correct patient, procedure, and site was verified   Indications:  Pain and diagnostic evaluation Location:  Shoulder Site:  L subacromial bursa Prep: patient was prepped and draped in usual sterile fashion   Needle Size:  18 G Needle Length:  1.5 inches Approach:  Posterior Ultrasound Guidance: No   Fluoroscopic Guidance: No   Arthrogram: No   Medications:  5 mL lidocaine 1 %; 9 mL bupivacaine 0.5 %; 40 mg methylPREDNISolone acetate 40 MG/ML Aspiration Attempted: No   Patient tolerance:  Patient tolerated the procedure well with no immediate complications     Clinical Data: No additional findings.  Objective: Vital Signs: Ht 5\' 8"  (1.727 m)   Wt 196 lb (88.9 kg)  BMI 29.80 kg/m   Physical Exam   Constitutional: Patient appears well-developed HEENT:  Head: Normocephalic Eyes:EOM are normal Neck: Normal range of motion Cardiovascular: Normal rate Pulmonary/chest: Effort normal Neurologic: Patient is alert Skin: Skin is warm Psychiatric: Patient has normal mood and affect    Ortho Exam examination of the shoulders demonstrates fairly significant weakness to infraspinatus testing bilaterally.  It is a little bit worse on the right than the left but definitely 3+ out of 5 bilaterally.  Isolated glenohumeral abduction and also as less than 90 with weakness more on the right than the left.  Forward flexion also barely surpass is 90 on the left and this Dictation she can achieve 90 on the right.  Cervical spine range  of motion is full.  Motor sensory function to the hand is intact bilaterally.  Radial pulse intact bilaterally.  She has excellent grip strength and upper arm strength in the biceps and triceps.  She does have more coarseness and grinding with passive range of motion on the right than the left.  Positive impingement signs bilaterally.  No other masses lymph adenopathy or skin changes noted in the shoulder girdles on either side.  Specialty Comments:  No specialty comments available.  Imaging: Xr Shoulder Left  Result Date: 09/25/2016 AP outlet and axillary view left shoulder reviewed.  Shoulder is located.  Acromiohumeral distance minimally decreased.  Visualized lung fields clear.  Before meals joint mild degenerative changes.  No other soft tissue calcifications present.  Xr Shoulder Right  Result Date: 09/25/2016 AP lateral and axillary view right shoulder reviewed.  Mild narrowing of the acromiohumeral distance is present.  No soft tissue calcifications present.  Visualized lung fields clear.  Mild before meals joint degenerative changes present.  There is some disruption of the medial proximal humerus arc and lateral inferior scapular arc.    PMFS History: Patient Active Problem List   Diagnosis Date Noted  . Grief reaction 07/01/2015  . Ankle sprain 02/27/2015  . OA (osteoarthritis) 02/27/2015  . Hip bursitis 02/27/2015  . Essential hypertension, benign 01/16/2015  . Diabetes mellitus type 2, controlled ([place]) 01/16/2015  . Hyperlipidemia 01/16/2015  . Left shoulder pain 01/16/2015  . Obesity 01/16/2015  . Glaucoma 01/16/2015   Past Medical History:  Diagnosis Date  . Allergy    seasonal allergies  . Anxiety   . Arthritis   . Diabetes mellitus   . Glaucoma   . Hyperlipidemia   . Hypertension   . Osteopenia     Family History  Problem Relation Age of Onset  . Arthritis Mother   . Cancer Sister   . Diabetes Sister   . Hyperlipidemia Sister   . Hypertension Sister       Past Surgical History:  Procedure Laterality Date  . ABDOMINAL HYSTERECTOMY     Social History   Occupational History  . Not on file.   Social History Main Topics  . Smoking status: Former Smoker    Types: Cigarettes    Quit date: 09/08/1981  . Smokeless tobacco: Never Used  . Alcohol use No  . Drug use: No  . Sexual activity: Not Currently

## 2016-10-13 DIAGNOSIS — H401131 Primary open-angle glaucoma, bilateral, mild stage: Secondary | ICD-10-CM | POA: Diagnosis not present

## 2016-11-03 ENCOUNTER — Ambulatory Visit (INDEPENDENT_AMBULATORY_CARE_PROVIDER_SITE_OTHER): Payer: Medicare Other | Admitting: Family Medicine

## 2016-11-03 ENCOUNTER — Encounter: Payer: Self-pay | Admitting: Family Medicine

## 2016-11-03 VITALS — BP 144/80 | HR 82 | Temp 98.2°F | Resp 14 | Ht 68.0 in | Wt 201.0 lb

## 2016-11-03 DIAGNOSIS — L700 Acne vulgaris: Secondary | ICD-10-CM | POA: Diagnosis not present

## 2016-11-03 NOTE — Progress Notes (Signed)
   Subjective:    Patient ID: Victoria Mcconnell, female    DOB: December 14, 1945, 71 y.o.   MRN: [id number]  Patient presents for Lump Under R Breast (x1 week- noted pimple like area under R breast- no drainage noted- denies pain)    Patient here with a lump under her right breast. She states that she felt this about a week ago. When I looked at it was actually blackhead sheet which was beneath her breasts. This is a sustained lump that she was referring to. She has not had any drainage from the area. States she has had black and some other parts of her body as well. She had a mammogram was normal in October She states that she has been stressing out thinking it was cancer She wants to have this removed  Review Of Systems:  GEN- denies fatigue, fever, weight loss,weakness, recent illness HEENT- denies eye drainage, change in vision, nasal discharge, CVS- denies chest pain, palpitations RESP- denies SOB, cough, wheeze ABD- denies N/V, change in stools, abd pain Neuro- denies headache, dizziness, syncope, seizure activity       Objective:    BP (!) 144/80   Pulse 82   Temp 98.2 F (36.8 C) (Oral)   Resp 14   Ht 5\' 8"  (1.727 m)   Wt 201 lb (91.2 kg)   SpO2 98%   BMI 30.56 kg/m  GEN- NAD, alert and oriented, Neck- supple, no thyromegaly Breast- normal symmetry, no nipple inversion,no nipple drainage, no nodules or lumps felt Nodes- no axillary nodes Skin- beneath right breast- blackhead, NT, no fluctuance, no erythema  Procedure- Incision and Drainage Procedure explained to patient questions answered benefits and risks discussed verbal consent obtained. Antiseptic-Betadine  Anesthesia-lidocaine no Epi approx .5cc Incision performed over small lesion - black head removed within sac with pressure  Minimal blood loss Patient tolerated procedure well Bandage applied         Assessment & Plan:      Problem List Items Addressed This Visit    None    Visit Diagnoses    Blackhead    -  Primary   Blackhead, removed beneath breast, no sign of lump or nodule in breast, given reassurance, recent mammogram      Note: This dictation was prepared with Dragon dictation along with smaller phrase technology. Any transcriptional errors that result from this process are unintentional.

## 2016-11-03 NOTE — Patient Instructions (Signed)
F/U as needed

## 2016-11-06 ENCOUNTER — Ambulatory Visit (INDEPENDENT_AMBULATORY_CARE_PROVIDER_SITE_OTHER): Payer: Medicare Other | Admitting: Orthopedic Surgery

## 2016-11-06 ENCOUNTER — Encounter (INDEPENDENT_AMBULATORY_CARE_PROVIDER_SITE_OTHER): Payer: Self-pay | Admitting: Orthopedic Surgery

## 2016-11-06 DIAGNOSIS — M25512 Pain in left shoulder: Secondary | ICD-10-CM

## 2016-11-06 NOTE — Progress Notes (Signed)
Office Visit Note   Patient: Victoria Mcconnell           Date of Birth: November 02, 1945           MRN: [id number] Visit Date: 11/06/2016 Requested by: Alycia Rossetti, MD [address] [place], [place] [address] PCP: Vic Blackbird, MD  Subjective: Chief Complaint  Patient presents with  . Left Shoulder - Follow-up    HPI Victoria Mcconnell is a 71 year old patient with left shoulder pain.  She had an injection 09/25/2016.  She states that she is much better after the injection.  She's having diminished pain and increased functional improvement in the left shoulder.  She takes Aleve with some relief.  She is retired.              Review of Systems All systems reviewed are negative as they relate to the chief complaint within the history of present illness.  Patient denies  fevers or chills.    Assessment & Plan: Visit Diagnoses:  1. Acute pain of left shoulder     Plan: Impression left shoulder bursitis and degenerative changes improved with an injection performed a month ago.  Current plan is to continue with below shoulder level cuff strengthening exercises.  I don't think another injection or further imaging is indicated at this time.  I will see her back should her symptoms recur.  Follow-Up Instructions: Return if symptoms worsen or fail to improve.   Orders:  No orders of the defined types were placed in this encounter.  No orders of the defined types were placed in this encounter.     Procedures: No procedures performed   Clinical Data: No additional findings.  Objective: Vital Signs: There were no vitals taken for this visit.  Physical Exam   Constitutional: Patient appears well-developed HEENT:  Head: Normocephalic Eyes:EOM are normal Neck: Normal range of motion Cardiovascular: Normal rate Pulmonary/chest: Effort normal Neurologic: Patient is alert Skin: Skin is warm Psychiatric: Patient has normal mood and affect    Ortho Exam orthopedic exam demonstrates  good cervical spine range of motion improved active and passive range of motion of the left shoulder with no impingement signs.  I'll detect much in way of a change in the coarseness or grinding in the shoulder with passive or active range of motion.  Motor sensory function to the arm is intact.  No other masses lymph adenopathy or skin changes noted in the left shoulder region  Specialty Comments:  No specialty comments available.  Imaging: No results found.   PMFS History: Patient Active Problem List   Diagnosis Date Noted  . Grief reaction 07/01/2015  . Ankle sprain 02/27/2015  . OA (osteoarthritis) 02/27/2015  . Hip bursitis 02/27/2015  . Essential hypertension, benign 01/16/2015  . Diabetes mellitus type 2, controlled ([place]) 01/16/2015  . Hyperlipidemia 01/16/2015  . Left shoulder pain 01/16/2015  . Obesity 01/16/2015  . Glaucoma 01/16/2015   Past Medical History:  Diagnosis Date  . Allergy    seasonal allergies  . Anxiety   . Arthritis   . Diabetes mellitus   . Glaucoma   . Hyperlipidemia   . Hypertension   . Osteopenia     Family History  Problem Relation Age of Onset  . Arthritis Mother   . Cancer Sister   . Diabetes Sister   . Hyperlipidemia Sister   . Hypertension Sister     Past Surgical History:  Procedure Laterality Date  . ABDOMINAL HYSTERECTOMY  Social History   Occupational History  . Not on file.   Social History Main Topics  . Smoking status: Former Smoker    Types: Cigarettes    Quit date: 09/08/1981  . Smokeless tobacco: Never Used  . Alcohol use No  . Drug use: No  . Sexual activity: Not Currently

## 2016-11-30 ENCOUNTER — Other Ambulatory Visit: Payer: Self-pay | Admitting: Family Medicine

## 2016-12-15 ENCOUNTER — Other Ambulatory Visit: Payer: Self-pay | Admitting: Family Medicine

## 2017-01-01 ENCOUNTER — Ambulatory Visit (INDEPENDENT_AMBULATORY_CARE_PROVIDER_SITE_OTHER): Payer: Medicare Other | Admitting: Family Medicine

## 2017-01-01 ENCOUNTER — Encounter: Payer: Self-pay | Admitting: Family Medicine

## 2017-01-01 VITALS — BP 130/72 | HR 88 | Temp 97.9°F | Resp 12 | Ht 68.0 in | Wt 196.0 lb

## 2017-01-01 DIAGNOSIS — I10 Essential (primary) hypertension: Secondary | ICD-10-CM | POA: Diagnosis not present

## 2017-01-01 DIAGNOSIS — E7800 Pure hypercholesterolemia, unspecified: Secondary | ICD-10-CM | POA: Diagnosis not present

## 2017-01-01 DIAGNOSIS — E119 Type 2 diabetes mellitus without complications: Secondary | ICD-10-CM

## 2017-01-01 DIAGNOSIS — F321 Major depressive disorder, single episode, moderate: Secondary | ICD-10-CM

## 2017-01-01 LAB — CBC WITH DIFFERENTIAL/PLATELET
BASOS ABS: 82 {cells}/uL (ref 0–200)
Basophils Relative: 1 %
EOS PCT: 3 %
Eosinophils Absolute: 246 cells/uL (ref 15–500)
HCT: 34.6 % — ABNORMAL LOW (ref 35.0–45.0)
HEMOGLOBIN: 11.3 g/dL — AB (ref 12.0–15.0)
LYMPHS PCT: 23 %
Lymphs Abs: 1886 cells/uL (ref 850–3900)
MCH: 29 pg (ref 27.0–33.0)
MCHC: 32.7 g/dL (ref 32.0–36.0)
MCV: 88.9 fL (ref 80.0–100.0)
MONOS PCT: 7 %
MPV: 10 fL (ref 7.5–12.5)
Monocytes Absolute: 574 cells/uL (ref 200–950)
NEUTROS PCT: 66 %
Neutro Abs: 5412 cells/uL (ref 1500–7800)
PLATELETS: 241 10*3/uL (ref 140–400)
RBC: 3.89 MIL/uL (ref 3.80–5.10)
RDW: 14.3 % (ref 11.0–15.0)
WBC: 8.2 10*3/uL (ref 3.8–10.8)

## 2017-01-01 LAB — COMPREHENSIVE METABOLIC PANEL
ALBUMIN: 4.3 g/dL (ref 3.6–5.1)
ALT: 13 U/L (ref 6–29)
AST: 19 U/L (ref 10–35)
Alkaline Phosphatase: 59 U/L (ref 33–130)
BUN: 16 mg/dL (ref 7–25)
CHLORIDE: 106 mmol/L (ref 98–110)
CO2: 27 mmol/L (ref 20–31)
CREATININE: 1.08 mg/dL — AB (ref 0.50–0.99)
Calcium: 10.5 mg/dL — ABNORMAL HIGH (ref 8.6–10.4)
GLUCOSE: 105 mg/dL — AB (ref 70–99)
Potassium: 3.7 mmol/L (ref 3.5–5.3)
SODIUM: 141 mmol/L (ref 135–146)
Total Bilirubin: 0.5 mg/dL (ref 0.2–1.2)
Total Protein: 7.3 g/dL (ref 6.1–8.1)

## 2017-01-01 LAB — LIPID PANEL
Cholesterol: 169 mg/dL (ref <200)
HDL: 81 mg/dL (ref >50)
LDL CALC: 74 mg/dL (ref <100)
TRIGLYCERIDES: 72 mg/dL (ref <150)
Total CHOL/HDL Ratio: 2.1 Ratio (ref <5.0)
VLDL: 14 mg/dL (ref <30)

## 2017-01-01 MED ORDER — ESCITALOPRAM OXALATE 5 MG PO TABS: 5.0000 mg | ORAL_TABLET | Freq: Every day | ORAL | 1 refills | Status: DC

## 2017-01-01 MED ORDER — ALPRAZOLAM 1 MG PO TABS: 1.0000 mg | ORAL_TABLET | Freq: Two times a day (BID) | ORAL | 1 refills | Status: DC | PRN

## 2017-01-01 NOTE — Patient Instructions (Signed)
Start lexapro at bedtime F/U 1 month for medications

## 2017-01-01 NOTE — Progress Notes (Signed)
   Subjective:    Patient ID: Victoria Mcconnell, female    DOB: March 06, 1946, 71 y.o.   MRN: [id number]  Patient presents for 4 month F/U (is fasting) and Anxiety (has increased stress in life)  Here to follow chronic medical problems. Diabetes mellitus her last A1c was 5.9% decrease her metformin to 500 mg twice a day. Her cholesterol was at goal she is on ACE inhibitor as well as statin drug  CBG run- 80-100's  HTN- taking BP meds as prescribed, no side effects   Allergies- uses singulair   Insomnia- uses xanax at night , still having Stress. Her sister whom she is helping take care , health is failing she has multiple myeloma and has had declining health with multiple hospitalizations recently. She states his discharge is a strong to help take care of her and her other sister who also has medical problems but finds it very difficult. She still grieving the loss of her husband as well. She does have support from her children but finds everything very overwhelming.   Review Of Systems:  GEN- denies fatigue, fever, weight loss,weakness, recent illness HEENT- denies eye drainage, change in vision, nasal discharge, CVS- denies chest pain, palpitations RESP- denies SOB, cough, wheeze ABD- denies N/V, change in stools, abd pain GU- denies dysuria, hematuria, dribbling, incontinence MSK- denies joint pain, muscle aches, injury Neuro- denies headache, dizziness, syncope, seizure activity       Objective:    BP 130/72   Pulse 88   Temp 97.9 F (36.6 C) (Oral)   Resp 12   Ht _0  (1.727 m)   Wt 196 lb (88.9 kg)   SpO2 99%   BMI 29.80 kg/m  GEN- NAD, alert and oriented x3 HEENT- PERRL, EOMI, non injected sclera, pink conjunctiva, MMM, oropharynx clear Neck- Supple, no thyromegaly CVS- RRR, no murmur RESP-CTAB Psych stressed/depressed affect, tearful, not anxious appearing, no SI, well groomed  Pulses- Radial, DP- 2+        Assessment & Plan:      Problem List Items  Addressed This Visit    Hyperlipidemia - Primary   Relevant Orders   Lipid panel (Completed)   Essential hypertension, benign    Controlled, no changes       Relevant Orders   CBC with Differential/Platelet (Completed)   Comprehensive metabolic panel (Completed)   Diabetes mellitus type 2, controlled (HCC)    Controlled, recheck A1C on lower dose of metformin Fasting blood sugars still look good Goal < 7%       Relevant Orders   Lipid panel (Completed)   Hemoglobin A1c (Completed)   Depression, major, single episode, moderate (HCC)    Significant stressors with ailing family members and husbands death last year  Start lexapro at bedtime Continue alprazolam as needed , should be able to decrease use with addition of lexapro, no sign of abuse of the benzo f/u meds ina few weeks       Relevant Medications   ALPRAZolam (XANAX) 1 MG tablet   escitalopram (LEXAPRO) 5 MG tablet      Note: This dictation was prepared with Dragon dictation along with smaller phrase technology. Any transcriptional errors that result from this process are unintentional.

## 2017-01-02 LAB — HEMOGLOBIN A1C
HEMOGLOBIN A1C: 5.8 % — AB (ref <5.7)
Mean Plasma Glucose: 120 mg/dL

## 2017-01-03 ENCOUNTER — Encounter: Payer: Self-pay | Admitting: Family Medicine

## 2017-01-03 NOTE — Assessment & Plan Note (Signed)
Controlled, no changes. 

## 2017-01-03 NOTE — Assessment & Plan Note (Signed)
Continue statin. 

## 2017-01-03 NOTE — Assessment & Plan Note (Addendum)
Significant stressors with ailing family members and husbands death last year  Start lexapro at bedtime Continue alprazolam as needed , should be able to decrease use with addition of lexapro, no sign of abuse of the benzo f/u meds ina few weeks

## 2017-01-03 NOTE — Assessment & Plan Note (Signed)
Controlled, recheck A1C on lower dose of metformin Fasting blood sugars still look good Goal < 7%

## 2017-01-04 ENCOUNTER — Other Ambulatory Visit: Payer: Self-pay | Admitting: *Deleted

## 2017-01-04 MED ORDER — METFORMIN HCL 500 MG PO TABS: 500.0000 mg | ORAL_TABLET | Freq: Every day | ORAL | 3 refills | Status: DC

## 2017-01-12 DIAGNOSIS — H401131 Primary open-angle glaucoma, bilateral, mild stage: Secondary | ICD-10-CM | POA: Diagnosis not present

## 2017-01-12 DIAGNOSIS — H2513 Age-related nuclear cataract, bilateral: Secondary | ICD-10-CM | POA: Insufficient documentation

## 2017-01-12 DIAGNOSIS — H524 Presbyopia: Secondary | ICD-10-CM | POA: Diagnosis not present

## 2017-01-12 DIAGNOSIS — E119 Type 2 diabetes mellitus without complications: Secondary | ICD-10-CM | POA: Insufficient documentation

## 2017-01-12 LAB — HM DIABETES EYE EXAM

## 2017-01-22 ENCOUNTER — Encounter: Payer: Self-pay | Admitting: Family Medicine

## 2017-02-03 ENCOUNTER — Encounter: Payer: Self-pay | Admitting: Family Medicine

## 2017-02-03 ENCOUNTER — Ambulatory Visit (INDEPENDENT_AMBULATORY_CARE_PROVIDER_SITE_OTHER): Payer: Medicare Other | Admitting: Family Medicine

## 2017-02-03 VITALS — BP 116/58 | HR 86 | Temp 98.4°F | Resp 16 | Wt 197.8 lb

## 2017-02-03 DIAGNOSIS — L811 Chloasma: Secondary | ICD-10-CM | POA: Diagnosis not present

## 2017-02-03 DIAGNOSIS — F321 Major depressive disorder, single episode, moderate: Secondary | ICD-10-CM

## 2017-02-03 DIAGNOSIS — L700 Acne vulgaris: Secondary | ICD-10-CM | POA: Diagnosis not present

## 2017-02-03 DIAGNOSIS — L680 Hirsutism: Secondary | ICD-10-CM | POA: Diagnosis not present

## 2017-02-03 DIAGNOSIS — L669 Cicatricial alopecia, unspecified: Secondary | ICD-10-CM | POA: Diagnosis not present

## 2017-02-03 DIAGNOSIS — L818 Other specified disorders of pigmentation: Secondary | ICD-10-CM | POA: Diagnosis not present

## 2017-02-03 DIAGNOSIS — L659 Nonscarring hair loss, unspecified: Secondary | ICD-10-CM | POA: Diagnosis not present

## 2017-02-03 DIAGNOSIS — L731 Pseudofolliculitis barbae: Secondary | ICD-10-CM | POA: Diagnosis not present

## 2017-02-03 MED ORDER — ESCITALOPRAM OXALATE 5 MG PO TABS: 5.0000 mg | ORAL_TABLET | Freq: Every day | ORAL | 3 refills | Status: DC

## 2017-02-03 NOTE — Patient Instructions (Signed)
F/U  3 months 

## 2017-02-03 NOTE — Progress Notes (Signed)
   Subjective:    Patient ID: Victoria Mcconnell, female    DOB: [birth year], 71 y.o.   MRN: [id number]  Patient presents for Follow-up   Pt here for intermin f/u on depression. Her sister passed away 1 week ago, she was on hospice, she feels at piece since the entire family was there to say there goodbyes. The lexapro has really helped, she is sleeping well and functioning well. She has support of her other siblings. Does not use the xanax as much. Feels good today. No new concerns    Review Of Systems:  GEN- denies fatigue, fever, weight loss,weakness, recent illness HEENT- denies eye drainage, change in vision, nasal discharge, CVS- denies chest pain, palpitations RESP- denies SOB, cough, wheeze ABD- denies N/V, change in stools, abd pain GU- denies dysuria, hematuria, dribbling, incontinence MSK- denies joint pain, muscle aches, injury Neuro- denies headache, dizziness, syncope, seizure activity       Objective:    BP (!) 116/58   Pulse 86   Temp 98.4 F (36.9 C)   Resp 16   Wt 197 lb 12.8 oz (89.7 kg)   BMI 30.08 kg/m  GEN- NAD, alert and oriented x3 Psych- normal affect and mood        Assessment & Plan:     Approx 15 minutes spent with pt > 50% on counseling and medication Problem List Items Addressed This Visit    Depression, major, single episode, moderate (HCC) - Primary    Continue lexapro 5mg  and prn benzo She has good support system Call for any concerns. Hospice also involved with family       Relevant Medications   escitalopram (LEXAPRO) 5 MG tablet      Note: This dictation was prepared with Dragon dictation along with smaller phrase technology. Any transcriptional errors that result from this process are unintentional.

## 2017-02-03 NOTE — Assessment & Plan Note (Signed)
Continue lexapro 5mg  and prn benzo She has good support system Call for any concerns. Hospice also involved with family

## 2017-04-10 ENCOUNTER — Other Ambulatory Visit: Payer: Self-pay | Admitting: Family Medicine

## 2017-04-20 DIAGNOSIS — H401131 Primary open-angle glaucoma, bilateral, mild stage: Secondary | ICD-10-CM | POA: Diagnosis not present

## 2017-04-21 ENCOUNTER — Encounter: Payer: Self-pay | Admitting: Family Medicine

## 2017-04-21 ENCOUNTER — Ambulatory Visit (INDEPENDENT_AMBULATORY_CARE_PROVIDER_SITE_OTHER): Payer: Medicare Other | Admitting: Family Medicine

## 2017-04-21 VITALS — BP 140/78 | HR 82 | Temp 98.2°F | Resp 16 | Wt 194.0 lb

## 2017-04-21 DIAGNOSIS — N390 Urinary tract infection, site not specified: Secondary | ICD-10-CM | POA: Diagnosis not present

## 2017-04-21 DIAGNOSIS — K6289 Other specified diseases of anus and rectum: Secondary | ICD-10-CM

## 2017-04-21 DIAGNOSIS — R195 Other fecal abnormalities: Secondary | ICD-10-CM | POA: Diagnosis not present

## 2017-04-21 NOTE — Progress Notes (Signed)
   Subjective:    Patient ID: Victoria Mcconnell, female    DOB: [birth year], 71 y.o.   MRN: [id number]  Patient presents for Dysuria (states that she she burning with urination) and GI Issues (states that she has some burning after having BM)   Note CHL was down during this visit.   Pt here dysyuria for past week, has some pressure and burning, no odor, no vaginal discharge. No fever, no N/V  Rectal pain with bowel movements for past few weeks. Denies constipation or diarrhea, no blood in stool, no dark tarry stools. No OTC meds used. No pain between bowel movements. Colonoscopy done in a few years ago, had polyps    Review Of Systems:  GEN- denies fatigue, fever, weight loss,weakness, recent illness HEENT- denies eye drainage, change in vision, nasal discharge, CVS- denies chest pain, palpitations RESP- denies SOB, cough, wheeze ABD- denies N/V, change in stools, abd pain GU-+ dysuria, denies hematuria, dribbling, incontinence MSK- denies joint pain, muscle aches, injury Neuro- denies headache, dizziness, syncope, seizure activity       Objective:    BP 140/78   Pulse 82   Temp 98.2 F (36.8 C) (Oral)   Resp 16   Wt 194 lb (88 kg)   SpO2 98%   BMI 29.50 kg/m  GEN- NAD, alert and oriented x3 HEENT- PERRL, EOMI, non injected sclera, pink conjunctiva, MMM, oropharynx clear CVS- RRR, no murmur RESP-CTAB ABD-NABS,soft,NT,ND, no CVA tenderness  Rectum- normal tone, no fissues, swelling palpated on digit exam, FOBT positive, no gross blood  Pulses- Radial 2+        Assessment & Plan:      Problem List Items Addressed This Visit    None    Visit Diagnoses    Urinary tract infection without hematuria, site unspecified    -  Primary   sTART Keflex , continue pushing fluids   Relevant Orders   Urinalysis, Routine w reflex microscopic   Rectal pain       Based on exam internal hemorroids, plan to use hydrocortisone suppository BID for 5 days, get appt with her GI, may  need repeat colonoscopy with blood in stool   Relevant Orders   Ambulatory referral to Gastroenterology   Heme positive stool       Relevant Orders   Ambulatory referral to Gastroenterology      Note: This dictation was prepared with Dragon dictation along with smaller phrase technology. Any transcriptional errors that result from this process are unintentional.

## 2017-04-22 LAB — URINALYSIS, ROUTINE W REFLEX MICROSCOPIC
Bilirubin Urine: NEGATIVE
Glucose, UA: NEGATIVE
HGB URINE DIPSTICK: NEGATIVE
KETONES UR: NEGATIVE
LEUKOCYTES UA: NEGATIVE
Nitrite: NEGATIVE
PROTEIN: NEGATIVE
Specific Gravity, Urine: 1.01 (ref 1.001–1.035)
pH: 6 (ref 5.0–8.0)

## 2017-04-22 MED ORDER — HYDROCORTISONE 1 % EX CREA: 1.0000 "application " | TOPICAL_CREAM | Freq: Two times a day (BID) | CUTANEOUS | 0 refills | Status: DC

## 2017-04-22 MED ORDER — CEPHALEXIN 500 MG PO CAPS: 500.0000 mg | ORAL_CAPSULE | Freq: Two times a day (BID) | ORAL | 0 refills | Status: DC

## 2017-04-22 NOTE — Patient Instructions (Signed)
F/U as previous 

## 2017-04-27 DIAGNOSIS — R195 Other fecal abnormalities: Secondary | ICD-10-CM | POA: Diagnosis not present

## 2017-04-27 DIAGNOSIS — K5904 Chronic idiopathic constipation: Secondary | ICD-10-CM | POA: Diagnosis not present

## 2017-04-27 DIAGNOSIS — Z800 Family history of malignant neoplasm of digestive organs: Secondary | ICD-10-CM | POA: Diagnosis not present

## 2017-04-27 DIAGNOSIS — Z1211 Encounter for screening for malignant neoplasm of colon: Secondary | ICD-10-CM | POA: Diagnosis not present

## 2017-04-27 DIAGNOSIS — Z86010 Personal history of colonic polyps: Secondary | ICD-10-CM | POA: Diagnosis not present

## 2017-05-07 ENCOUNTER — Ambulatory Visit: Payer: Medicare Other | Admitting: Family Medicine

## 2017-05-31 DIAGNOSIS — Z1211 Encounter for screening for malignant neoplasm of colon: Secondary | ICD-10-CM | POA: Diagnosis not present

## 2017-05-31 DIAGNOSIS — K635 Polyp of colon: Secondary | ICD-10-CM | POA: Diagnosis not present

## 2017-05-31 DIAGNOSIS — Z800 Family history of malignant neoplasm of digestive organs: Secondary | ICD-10-CM | POA: Diagnosis not present

## 2017-05-31 DIAGNOSIS — D125 Benign neoplasm of sigmoid colon: Secondary | ICD-10-CM | POA: Diagnosis not present

## 2017-05-31 DIAGNOSIS — R195 Other fecal abnormalities: Secondary | ICD-10-CM | POA: Diagnosis not present

## 2017-06-14 ENCOUNTER — Other Ambulatory Visit: Payer: Self-pay | Admitting: Family Medicine

## 2017-06-14 DIAGNOSIS — Z1231 Encounter for screening mammogram for malignant neoplasm of breast: Secondary | ICD-10-CM

## 2017-06-16 ENCOUNTER — Other Ambulatory Visit: Payer: Self-pay | Admitting: Family Medicine

## 2017-07-07 ENCOUNTER — Ambulatory Visit (INDEPENDENT_AMBULATORY_CARE_PROVIDER_SITE_OTHER): Payer: Medicare Other | Admitting: Family Medicine

## 2017-07-07 ENCOUNTER — Encounter: Payer: Self-pay | Admitting: Family Medicine

## 2017-07-07 VITALS — BP 138/78 | HR 98 | Temp 97.9°F | Resp 14 | Ht 68.0 in | Wt 192.0 lb

## 2017-07-07 DIAGNOSIS — F411 Generalized anxiety disorder: Secondary | ICD-10-CM | POA: Diagnosis not present

## 2017-07-07 DIAGNOSIS — Z23 Encounter for immunization: Secondary | ICD-10-CM | POA: Diagnosis not present

## 2017-07-07 DIAGNOSIS — E119 Type 2 diabetes mellitus without complications: Secondary | ICD-10-CM

## 2017-07-07 DIAGNOSIS — K5901 Slow transit constipation: Secondary | ICD-10-CM | POA: Diagnosis not present

## 2017-07-07 DIAGNOSIS — F321 Major depressive disorder, single episode, moderate: Secondary | ICD-10-CM

## 2017-07-07 DIAGNOSIS — I10 Essential (primary) hypertension: Secondary | ICD-10-CM

## 2017-07-07 DIAGNOSIS — K5900 Constipation, unspecified: Secondary | ICD-10-CM | POA: Insufficient documentation

## 2017-07-07 MED ORDER — ESCITALOPRAM OXALATE 5 MG PO TABS: 5.0000 mg | ORAL_TABLET | Freq: Every day | ORAL | 6 refills | Status: DC

## 2017-07-07 MED ORDER — LINACLOTIDE 72 MCG PO CAPS: 72.0000 ug | ORAL_CAPSULE | Freq: Every day | ORAL | 0 refills | Status: DC

## 2017-07-07 NOTE — Patient Instructions (Addendum)
Tetanus Booster sent pharmacy  Take the linzess as prescribed  We will get copies of labs from Dr. Collene Mares Flu shot given  Schedule Physical for December - please find a spot

## 2017-07-07 NOTE — Progress Notes (Signed)
   Subjective:    Patient ID: Victoria Mcconnell, female    DOB: 18-Jun-1946, 71 y.o.   MRN: [id number]  Patient presents for Follow-up (is not fasting) and Medication Management (states that she is having increased constipation d/t Lipitor- would like to discuss changing)   Constipation- seen by GI, still not emptying using fiber - metamucil, drinking   she was concerned Lipitor was causing her constipation  DM- CBG range 88-104 , currently on Metformin 500mg     She is very anxious, about everything, worried that she is dying from something even though she has no symptoms of such and nothing has been found  She has not been taking the Lexapro or the Xanax is not sleeping very well and feels like her nerves are bad     Flu shot done    Review Of Systems:  GEN- denies fatigue, fever, weight loss,weakness, recent illness HEENT- denies eye drainage, change in vision, nasal discharge, CVS- denies chest pain, palpitations RESP- denies SOB, cough, wheeze ABD- denies N/V, change in stools, abd pain GU- denies dysuria, hematuria, dribbling, incontinence MSK- denies joint pain, muscle aches, injury Neuro- denies headache, dizziness, syncope, seizure activity       Objective:    BP 138/78   Pulse 98   Temp 97.9 F (36.6 C) (Oral)   Resp 14   Ht 5\' 8"  (1.727 m)   Wt 192 lb (87.1 kg)   SpO2 98%   BMI 29.19 kg/m  GEN- NAD, alert and oriented x3 HEENT- PERRL, EOMI, non injected sclera, pink conjunctiva, MMM, oropharynx clear Neck- Supple, no thyromegaly CVS- RRR, no murmur RESP-CTAB ABD-NABS,soft,NT,ND Psych- anxious, not depressed, no SI, well groomed, good eye contact EXT- No edema Pulses- Radial, DP- 2+        Assessment & Plan:      Problem List Items Addressed This Visit      Unprioritized   Depression, major, single episode, moderate (HCC)   Relevant Medications   escitalopram (LEXAPRO) 5 MG tablet   GAD (generalized anxiety disorder)    See previous  notations High stress, anxiety, Husband died, sister sick, 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 She is very overwhelmed and on edge Recommend she take the lexapro, she is willing to try, start 5mg  at bedtime       Relevant Medications   escitalopram (LEXAPRO) 5 MG tablet   Essential hypertension, benign    Controlled no changes       Relevant Orders   CBC with Differential/Platelet (Completed)   Comprehensive metabolic panel (Completed)   Diabetes mellitus type 2, controlled ([facility]) - Primary    Controlled on low dose metformin Check a1c      Relevant Orders   Hemoglobin A1c (Completed)   Constipation    Given Linzess 72mg  samples Continue fiber F/u GI for repeat scope       Other Visit Diagnoses    Need for influenza vaccination       Relevant Orders   Flu vaccine HIGH DOSE PF (Completed)      Note: This dictation was prepared with Dragon dictation along with smaller phrase technology. Any transcriptional errors that result from this process are unintentional.

## 2017-07-08 LAB — CBC WITH DIFFERENTIAL/PLATELET
Basophils Absolute: 92 cells/uL (ref 0–200)
Basophils Relative: 1 %
EOS ABS: 230 {cells}/uL (ref 15–500)
Eosinophils Relative: 2.5 %
HCT: 33.9 % — ABNORMAL LOW (ref 35.0–45.0)
Hemoglobin: 11.5 g/dL — ABNORMAL LOW (ref 11.7–15.5)
Lymphs Abs: 2217 cells/uL (ref 850–3900)
MCH: 29.6 pg (ref 27.0–33.0)
MCHC: 33.9 g/dL (ref 32.0–36.0)
MCV: 87.4 fL (ref 80.0–100.0)
MPV: 10.5 fL (ref 7.5–12.5)
Monocytes Relative: 6.6 %
NEUTROS PCT: 65.8 %
Neutro Abs: 6054 cells/uL (ref 1500–7800)
PLATELETS: 301 10*3/uL (ref 140–400)
RBC: 3.88 10*6/uL (ref 3.80–5.10)
RDW: 12.9 % (ref 11.0–15.0)
TOTAL LYMPHOCYTE: 24.1 %
WBC: 9.2 10*3/uL (ref 3.8–10.8)
WBCMIX: 607 {cells}/uL (ref 200–950)

## 2017-07-08 LAB — COMPREHENSIVE METABOLIC PANEL
AG Ratio: 1.4 (calc) (ref 1.0–2.5)
ALKALINE PHOSPHATASE (APISO): 68 U/L (ref 33–130)
ALT: 13 U/L (ref 6–29)
AST: 18 U/L (ref 10–35)
Albumin: 4.4 g/dL (ref 3.6–5.1)
BUN / CREAT RATIO: 13 (calc) (ref 6–22)
BUN: 14 mg/dL (ref 7–25)
CHLORIDE: 103 mmol/L (ref 98–110)
CO2: 27 mmol/L (ref 20–32)
CREATININE: 1.08 mg/dL — AB (ref 0.60–0.93)
Calcium: 10.8 mg/dL — ABNORMAL HIGH (ref 8.6–10.4)
GLUCOSE: 95 mg/dL (ref 65–99)
Globulin: 3.2 g/dL (calc) (ref 1.9–3.7)
Potassium: 4 mmol/L (ref 3.5–5.3)
Sodium: 139 mmol/L (ref 135–146)
Total Bilirubin: 0.5 mg/dL (ref 0.2–1.2)
Total Protein: 7.6 g/dL (ref 6.1–8.1)

## 2017-07-08 LAB — HEMOGLOBIN A1C
HEMOGLOBIN A1C: 5.8 %{Hb} — AB (ref <5.7)
MEAN PLASMA GLUCOSE: 120 (calc)
eAG (mmol/L): 6.6 (calc)

## 2017-07-08 NOTE — Assessment & Plan Note (Signed)
Given Linzess 72mg  samples Continue fiber F/u GI for repeat scope

## 2017-07-08 NOTE — Assessment & Plan Note (Signed)
Controlled no changes 

## 2017-07-08 NOTE — Assessment & Plan Note (Signed)
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

## 2017-07-08 NOTE — Assessment & Plan Note (Signed)
Controlled on low dose metformin Check a1c

## 2017-07-12 ENCOUNTER — Ambulatory Visit (HOSPITAL_COMMUNITY)
Admission: RE | Admit: 2017-07-12 | Discharge: 2017-07-12 | Disposition: A | Discharge status: Home / Self Care | Payer: Medicare Other | Source: Ambulatory Visit | Attending: Family Medicine | Admitting: Family Medicine

## 2017-07-12 ENCOUNTER — Encounter (HOSPITAL_COMMUNITY): Payer: Self-pay

## 2017-07-12 DIAGNOSIS — Z1231 Encounter for screening mammogram for malignant neoplasm of breast: Secondary | ICD-10-CM | POA: Insufficient documentation

## 2017-07-15 DIAGNOSIS — H401131 Primary open-angle glaucoma, bilateral, mild stage: Secondary | ICD-10-CM | POA: Diagnosis not present

## 2017-07-22 ENCOUNTER — Other Ambulatory Visit: Payer: Self-pay | Admitting: *Deleted

## 2017-07-22 MED ORDER — VITAMIN D 1000 UNITS PO TABS: 1000.0000 [IU] | ORAL_TABLET | Freq: Every day | ORAL | Status: DC

## 2017-08-03 ENCOUNTER — Other Ambulatory Visit: Payer: Self-pay | Admitting: *Deleted

## 2017-08-03 MED ORDER — ESCITALOPRAM OXALATE 5 MG PO TABS: 5.0000 mg | ORAL_TABLET | Freq: Every day | ORAL | 3 refills | Status: DC

## 2017-08-17 ENCOUNTER — Other Ambulatory Visit: Payer: Self-pay

## 2017-08-17 ENCOUNTER — Ambulatory Visit (INDEPENDENT_AMBULATORY_CARE_PROVIDER_SITE_OTHER): Payer: Medicare Other | Admitting: Family Medicine

## 2017-08-17 ENCOUNTER — Encounter: Payer: Self-pay | Admitting: Family Medicine

## 2017-08-17 VITALS — BP 130/78 | HR 98 | Temp 98.4°F | Resp 16 | Ht 68.0 in | Wt 195.0 lb

## 2017-08-17 DIAGNOSIS — H409 Unspecified glaucoma: Secondary | ICD-10-CM | POA: Diagnosis not present

## 2017-08-17 DIAGNOSIS — Z1159 Encounter for screening for other viral diseases: Secondary | ICD-10-CM | POA: Diagnosis not present

## 2017-08-17 DIAGNOSIS — Z0000 Encounter for general adult medical examination without abnormal findings: Secondary | ICD-10-CM

## 2017-08-17 DIAGNOSIS — E119 Type 2 diabetes mellitus without complications: Secondary | ICD-10-CM | POA: Diagnosis not present

## 2017-08-17 DIAGNOSIS — M8589 Other specified disorders of bone density and structure, multiple sites: Secondary | ICD-10-CM

## 2017-08-17 DIAGNOSIS — M8580 Other specified disorders of bone density and structure, unspecified site: Secondary | ICD-10-CM | POA: Insufficient documentation

## 2017-08-17 DIAGNOSIS — E7800 Pure hypercholesterolemia, unspecified: Secondary | ICD-10-CM | POA: Diagnosis not present

## 2017-08-17 MED ORDER — LINACLOTIDE 72 MCG PO CAPS: 72.0000 ug | ORAL_CAPSULE | Freq: Every day | ORAL | 6 refills | Status: DC

## 2017-08-17 NOTE — Progress Notes (Signed)
Subjective:   Patient presents for Medicare Annual/Subsequent preventive examination.  Dm- LAS a1c 5.8%, OFF Metformin 88-107  Constipation- given Linzess last visit this has helped her bowels   GAD-  she is doing much better with the Lexapro however she does not take regularly will sometimes skip a couple of days but does feel a difference when she does take for her anxiety  Mild hypercalcemia- last  10.8, prior 10.5 it was recommended that she stop her calcium supplement which she did about a month ago.  Review Past Medical/Family/Social: Per EMR    Risk Factors  Current exercise habits: walks Dietary issues discussed: Yes   Cardiac risk factors: Hypertension, hyperlipidemia  Depression Screen  (Note: if answer to either of the following is "Yes", a more complete depression screening is indicated)  Over the past two weeks, have you felt down, depressed or hopeless? No Over the past two weeks, have you felt little interest or pleasure in doing things? No Have you lost interest or pleasure in daily life? No Do you often feel hopeless? No Do you cry easily over simple problems? No   Activities of Daily Living  In your present state of health, do you have any difficulty performing the following activities?:  Driving? No  Managing money? No  Feeding yourself? No  Getting from bed to chair? No  Climbing a flight of stairs? No  Preparing food and eating?: No  Bathing or showering? No  Getting dressed: No  Getting to the toilet? No  Using the toilet:No  Moving around from place to place: No  In the past year have you fallen or had a near fall?:No  Are you sexually active? No  Do you have more than one partner? No   Hearing Difficulties: No  Do you often ask people to speak up or repeat themselves? yes  Do you experience ringing or noises in your ears? No Do you have difficulty understanding soft or whispered voices? No  Do you feel that you have a problem with memory?  No Do you often misplace items? No  Do you feel safe at home? Yes  Cognitive Testing  Alert? Yes Normal Appearance?Yes  Oriented to person? Yes Place? Yes  Time? Yes  Recall of three objects? Yes  Can perform simple calculations? Yes  Displays appropriate judgment?Yes  Can read the correct time from a watch face?Yes   List the Names of Other Physician/Practitioners you currently use:   Dermatology- Dr. Mariel Kansky    Screening Tests / Date Colonoscopy    UTD                 Zostavax - unable to afford Mammogram UTD Influenza Vaccine UTD Pneumonia- UTD Tetanus/tdap Unable to afford  ROS: GEN- denies fatigue, fever, weight loss,weakness, recent illness HEENT- denies eye drainage, change in vision, nasal discharge, CVS- denies chest pain, palpitations RESP- denies SOB, cough, wheeze ABD- denies N/V, change in stools, abd pain GU- denies dysuria, hematuria, dribbling, incontinence MSK- denies joint pain, muscle aches, injury Neuro- denies headache, dizziness, syncope, seizure activity  PHYSICAL: GEN- NAD, alert and oriented x3 HEENT- PERRL, EOMI, non injected sclera, pink conjunctiva, MMM, oropharynx clear Neck- Supple, no thryomegaly, no bruit CVS- RRR, no murmur RESP-CTAB ABD-NABS,soft,NT,ND EXT- No edema Pulses- Radial, DP- 2+     Assessment:    Annual wellness medicare exam   Plan:    During the course of the visit the patient was educated and counseled about appropriate screening and preventive  services including:  Hep C screening to be done  Unable to afford TDAP/SHingles based on her copay ANXIETY- discussed taking lexapro regulary, she does benefit from use  Preventative UTD   Hypercalcemia- very mild, discontined supplement, will recheck, also check PTH, she is very anxious about this   Glaucoma continues with ophthalmology   Osteopenia- Vitamin D 1000IU daily, weight bearing exercise  .  Diet review for nutrition referral? Yes ____ Not Indicated  __x__  Patient Instructions (the written plan) was given to the patient.  Medicare Attestation  I have personally reviewed:  The patient's medical and social history  Their use of alcohol, tobacco or illicit drugs  Their current medications and supplements  The patient's functional ability including ADLs,fall risks, home safety risks, cognitive, and hearing and visual impairment  Diet and physical activities  Evidence for depression or mood disorders  The patient's weight, height, BMI, and visual acuity have been recorded in the chart. I have made referrals, counseling, and provided education to the patient based on review of the above and I have provided the patient with a written personalized care plan for preventive services.

## 2017-08-17 NOTE — Patient Instructions (Addendum)
Stop the calcium Take Vitamin D 1000 Continue current medications F/U 4  MONTHS

## 2017-08-18 ENCOUNTER — Other Ambulatory Visit: Payer: Self-pay | Admitting: *Deleted

## 2017-08-18 LAB — HEPATITIS C ANTIBODY
Hepatitis C Ab: NONREACTIVE
SIGNAL TO CUT-OFF: 0.01 (ref <1.00)

## 2017-08-18 LAB — LIPID PANEL
CHOLESTEROL: 219 mg/dL — AB (ref <200)
HDL: 88 mg/dL (ref >50)
LDL Cholesterol (Calc): 113 mg/dL (calc) — ABNORMAL HIGH
Non-HDL Cholesterol (Calc): 131 mg/dL (calc) — ABNORMAL HIGH (ref <130)
Total CHOL/HDL Ratio: 2.5 (calc) (ref <5.0)
Triglycerides: 83 mg/dL (ref <150)

## 2017-08-18 LAB — PTH, INTACT AND CALCIUM
CALCIUM: 10.6 mg/dL — AB (ref 8.6–10.4)
PTH: 52 pg/mL (ref 14–64)

## 2017-08-18 MED ORDER — LINACLOTIDE 72 MCG PO CAPS: 72.0000 ug | ORAL_CAPSULE | Freq: Every day | ORAL | 6 refills | Status: DC

## 2017-08-30 ENCOUNTER — Other Ambulatory Visit: Payer: Self-pay | Admitting: Family Medicine

## 2017-10-19 DIAGNOSIS — H401131 Primary open-angle glaucoma, bilateral, mild stage: Secondary | ICD-10-CM | POA: Diagnosis not present

## 2017-11-01 ENCOUNTER — Other Ambulatory Visit: Payer: Self-pay | Admitting: Family Medicine

## 2017-11-08 DIAGNOSIS — L731 Pseudofolliculitis barbae: Secondary | ICD-10-CM | POA: Diagnosis not present

## 2017-11-08 DIAGNOSIS — L680 Hirsutism: Secondary | ICD-10-CM | POA: Diagnosis not present

## 2017-11-08 DIAGNOSIS — L810 Postinflammatory hyperpigmentation: Secondary | ICD-10-CM | POA: Diagnosis not present

## 2017-11-08 DIAGNOSIS — L239 Allergic contact dermatitis, unspecified cause: Secondary | ICD-10-CM | POA: Diagnosis not present

## 2017-11-08 DIAGNOSIS — L811 Chloasma: Secondary | ICD-10-CM | POA: Diagnosis not present

## 2017-11-08 DIAGNOSIS — L669 Cicatricial alopecia, unspecified: Secondary | ICD-10-CM | POA: Diagnosis not present

## 2017-11-08 DIAGNOSIS — L818 Other specified disorders of pigmentation: Secondary | ICD-10-CM | POA: Diagnosis not present

## 2017-11-08 DIAGNOSIS — L709 Acne, unspecified: Secondary | ICD-10-CM | POA: Diagnosis not present

## 2017-11-08 DIAGNOSIS — L659 Nonscarring hair loss, unspecified: Secondary | ICD-10-CM | POA: Diagnosis not present

## 2017-11-08 DIAGNOSIS — L700 Acne vulgaris: Secondary | ICD-10-CM | POA: Diagnosis not present

## 2017-12-17 ENCOUNTER — Other Ambulatory Visit: Payer: Self-pay

## 2017-12-17 ENCOUNTER — Encounter: Payer: Self-pay | Admitting: Family Medicine

## 2017-12-17 ENCOUNTER — Ambulatory Visit (INDEPENDENT_AMBULATORY_CARE_PROVIDER_SITE_OTHER): Payer: Medicare Other | Admitting: Family Medicine

## 2017-12-17 VITALS — BP 128/72 | HR 96 | Temp 98.4°F | Resp 16 | Ht 68.0 in | Wt 199.4 lb

## 2017-12-17 DIAGNOSIS — J302 Other seasonal allergic rhinitis: Secondary | ICD-10-CM

## 2017-12-17 DIAGNOSIS — E119 Type 2 diabetes mellitus without complications: Secondary | ICD-10-CM | POA: Diagnosis not present

## 2017-12-17 DIAGNOSIS — F411 Generalized anxiety disorder: Secondary | ICD-10-CM | POA: Diagnosis not present

## 2017-12-17 DIAGNOSIS — I10 Essential (primary) hypertension: Secondary | ICD-10-CM

## 2017-12-17 DIAGNOSIS — E7800 Pure hypercholesterolemia, unspecified: Secondary | ICD-10-CM

## 2017-12-17 NOTE — Patient Instructions (Signed)
F/U 4 months   Use nasal saline/Ocean Spray Take the anti-histamine We will call with lab results

## 2017-12-17 NOTE — Progress Notes (Signed)
   Subjective:    Patient ID: Victoria Mcconnell, female    DOB: 26-Aug-1946, 72 y.o.   MRN: [id number]  Patient presents for 4 month follow up  Pt here to f/u chronic medical problems   Having problems with her allergies, taking sinuglair   Shoulder pain chronic- has known bursitis and DJD in shoulder, had a steroid shot.Dr. Marlou Sa is his orthopedics    Hyperlipidemia- she is now taking cholesterol medicine consistently    Mild hypercalcemia- Ca was 10.6 no longer on calium supplements, taking vitamin D    DM- diet controlled, Last 5.8% , this am CBG 113   HTN- taking BP medicine as prescribed   Anxiety- taking lexapro , helping her nerves    Meds reviewed   Review Of Systems:  GEN- denies fatigue, fever, weight loss,weakness, recent illness HEENT- denies eye drainage, change in vision, nasal discharge, CVS- denies chest pain, palpitations RESP- denies SOB, cough, wheeze ABD- denies N/V, change in stools, abd pain GU- denies dysuria, hematuria, dribbling, incontinence MSK- denies joint pain, muscle aches, injury Neuro- denies headache, dizziness, syncope, seizure activity       Objective:    BP 128/72   Pulse 96   Temp 98.4 F (36.9 C) (Oral)   Resp 16   Ht 5\' 8"  (1.727 m)   Wt 199 lb 6.4 oz (90.4 kg)   SpO2 96%   BMI 30.32 kg/m  GEN- NAD, alert and oriented x3 HEENT- PERRL, EOMI, non injected sclera, pink conjunctiva, MMM, oropharynx clear, - nasal congestion  Neck- Supple, no thyromegaly, no LAD  CVS- RRR, no murmur RESP-CTAB ABD-NABS,soft,NT,ND Psych- normal affect and mood EXT- No edema Pulses- Radial, DP- 2+        Assessment & Plan:      Problem List Items Addressed This Visit      Unprioritized   Hyperlipidemia   Relevant Orders   Lipid panel (Completed)   GAD (generalized anxiety disorder)    Improved mood, no change to lexapro      Essential hypertension, benign - Primary    Controlled no changes       Relevant Orders   CBC with  Differential/Platelet (Completed)   Comprehensive metabolic panel (Completed)   Diabetes mellitus type 2, controlled ([place])    Diabetes has been well controlled Check A1C  Diet controlled On statin drug       Relevant Orders   Hemoglobin A1c (Completed)   HM DIABETES FOOT EXAM (Completed)    Other Visit Diagnoses    Seasonal allergies       OTC antihistamien along with nasal saline/rinse      Note: This dictation was prepared with Dragon dictation along with smaller phrase technology. Any transcriptional errors that result from this process are unintentional.

## 2017-12-18 LAB — CBC WITH DIFFERENTIAL/PLATELET
BASOS ABS: 88 {cells}/uL (ref 0–200)
Basophils Relative: 1.1 %
EOS PCT: 2.7 %
Eosinophils Absolute: 216 cells/uL (ref 15–500)
HEMATOCRIT: 35.9 % (ref 35.0–45.0)
Hemoglobin: 11.9 g/dL (ref 11.7–15.5)
LYMPHS ABS: 1792 {cells}/uL (ref 850–3900)
MCH: 28.7 pg (ref 27.0–33.0)
MCHC: 33.1 g/dL (ref 32.0–36.0)
MCV: 86.5 fL (ref 80.0–100.0)
MONOS PCT: 6 %
MPV: 10.9 fL (ref 7.5–12.5)
Neutro Abs: 5424 cells/uL (ref 1500–7800)
Neutrophils Relative %: 67.8 %
Platelets: 269 10*3/uL (ref 140–400)
RBC: 4.15 10*6/uL (ref 3.80–5.10)
RDW: 13 % (ref 11.0–15.0)
Total Lymphocyte: 22.4 %
WBC mixed population: 480 cells/uL (ref 200–950)
WBC: 8 10*3/uL (ref 3.8–10.8)

## 2017-12-18 LAB — COMPREHENSIVE METABOLIC PANEL
AG RATIO: 1.5 (calc) (ref 1.0–2.5)
ALKALINE PHOSPHATASE (APISO): 89 U/L (ref 33–130)
ALT: 12 U/L (ref 6–29)
AST: 16 U/L (ref 10–35)
Albumin: 4.4 g/dL (ref 3.6–5.1)
BUN / CREAT RATIO: 13 (calc) (ref 6–22)
BUN: 14 mg/dL (ref 7–25)
CO2: 28 mmol/L (ref 20–32)
CREATININE: 1.06 mg/dL — AB (ref 0.60–0.93)
Calcium: 10.3 mg/dL (ref 8.6–10.4)
Chloride: 106 mmol/L (ref 98–110)
GLUCOSE: 99 mg/dL (ref 65–99)
Globulin: 3 g/dL (calc) (ref 1.9–3.7)
Potassium: 4.1 mmol/L (ref 3.5–5.3)
Sodium: 141 mmol/L (ref 135–146)
Total Bilirubin: 0.5 mg/dL (ref 0.2–1.2)
Total Protein: 7.4 g/dL (ref 6.1–8.1)

## 2017-12-18 LAB — HEMOGLOBIN A1C
EAG (MMOL/L): 7 (calc)
HEMOGLOBIN A1C: 6 %{Hb} — AB (ref <5.7)
Mean Plasma Glucose: 126 (calc)

## 2017-12-18 LAB — LIPID PANEL
Cholesterol: 168 mg/dL (ref <200)
HDL: 71 mg/dL (ref >50)
LDL Cholesterol (Calc): 83 mg/dL (calc)
Non-HDL Cholesterol (Calc): 97 mg/dL (calc) (ref <130)
TRIGLYCERIDES: 67 mg/dL (ref <150)
Total CHOL/HDL Ratio: 2.4 (calc) (ref <5.0)

## 2017-12-19 ENCOUNTER — Encounter: Payer: Self-pay | Admitting: Family Medicine

## 2017-12-19 NOTE — Assessment & Plan Note (Signed)
Improved mood, no change to lexapro

## 2017-12-19 NOTE — Assessment & Plan Note (Signed)
Controlled no changes 

## 2017-12-19 NOTE — Assessment & Plan Note (Signed)
Diabetes has been well controlled Check A1C  Diet controlled On statin drug

## 2017-12-22 ENCOUNTER — Encounter: Payer: Self-pay | Admitting: *Deleted

## 2017-12-23 ENCOUNTER — Ambulatory Visit (INDEPENDENT_AMBULATORY_CARE_PROVIDER_SITE_OTHER): Payer: Medicare Other | Admitting: Orthopedic Surgery

## 2017-12-23 ENCOUNTER — Encounter (INDEPENDENT_AMBULATORY_CARE_PROVIDER_SITE_OTHER): Payer: Self-pay | Admitting: Orthopedic Surgery

## 2017-12-23 DIAGNOSIS — M25512 Pain in left shoulder: Secondary | ICD-10-CM

## 2017-12-23 DIAGNOSIS — G8929 Other chronic pain: Secondary | ICD-10-CM

## 2017-12-23 DIAGNOSIS — M25511 Pain in right shoulder: Secondary | ICD-10-CM

## 2017-12-23 DIAGNOSIS — M12811 Other specific arthropathies, not elsewhere classified, right shoulder: Secondary | ICD-10-CM

## 2017-12-23 DIAGNOSIS — M12812 Other specific arthropathies, not elsewhere classified, left shoulder: Principal | ICD-10-CM

## 2017-12-26 ENCOUNTER — Encounter (INDEPENDENT_AMBULATORY_CARE_PROVIDER_SITE_OTHER): Payer: Self-pay | Admitting: Orthopedic Surgery

## 2017-12-26 DIAGNOSIS — M12812 Other specific arthropathies, not elsewhere classified, left shoulder: Secondary | ICD-10-CM

## 2017-12-26 DIAGNOSIS — M12811 Other specific arthropathies, not elsewhere classified, right shoulder: Secondary | ICD-10-CM

## 2017-12-26 MED ORDER — BUPIVACAINE HCL 0.5 % IJ SOLN
9.0000 mL | INTRAMUSCULAR | Status: AC | PRN
  Administered 2017-12-26: 9 mL via INTRA_ARTICULAR

## 2017-12-26 MED ORDER — TRIAMCINOLONE ACETONIDE 40 MG/ML IJ SUSP
30.0000 mg | INTRAMUSCULAR | Status: AC | PRN
  Administered 2017-12-26: 30 mg via INTRA_ARTICULAR

## 2017-12-26 MED ORDER — LIDOCAINE HCL 1 % IJ SOLN
5.0000 mL | INTRAMUSCULAR | Status: AC | PRN
  Administered 2017-12-26: 5 mL

## 2017-12-26 NOTE — Progress Notes (Signed)
Office Visit Note   Patient: Victoria Mcconnell           Date of Birth: Jan 29, 1946           MRN: [id number] Visit Date: 12/23/2017 Requested by: Alycia Rossetti, MD [address] [place], [place] [address] PCP: Alycia Rossetti, MD  Subjective: Chief Complaint  Patient presents with  . Right Shoulder - Pain  . Left Shoulder - Pain    HPI: Victoria Mcconnell is a patient with bilateral shoulder pain.  Last injected on the left-hand side in January.  She reports right shoulder pain equal to left shoulder pain.  Pain occurs mostly at night.  Is a known history of likely rotator cuff arthropathy.  Denies any neck pain.  Does not take much medication.  Radiographs of January 2018 do show superior elevation of the humeral head.              ROS: All systems reviewed are negative as they relate to the chief complaint within the history of present illness.  Patient denies  fevers or chills.   Assessment & Plan: Visit Diagnoses:  1. Rotator cuff arthropathy of both shoulders   2. Acute pain of left shoulder   3. Chronic right shoulder pain     Plan: Impression is bilateral rotator cuff arthropathy affecting the shoulders.  Plan is repeat shoulder injection.  She is managing fairly well for now but will hold off on any type of surgical intervention until her symptoms worsen.  I will see her back as needed.  Follow-Up Instructions: Return if symptoms worsen or fail to improve.   Orders:  No orders of the defined types were placed in this encounter.  No orders of the defined types were placed in this encounter.     Procedures: Large Joint Inj: bilateral glenohumeral on 12/26/2017 10:33 AM Indications: diagnostic evaluation and pain Details: 18 G 1.5 in needle, posterior approach  Arthrogram: No  Medications (Right): 5 mL lidocaine 1 %; 30 mg triamcinolone acetonide 40 MG/ML; 9 mL bupivacaine 0.5 % Medications (Left): 5 mL lidocaine 1 %; 30 mg triamcinolone acetonide 40 MG/ML; 9 mL  bupivacaine 0.5 % Outcome: tolerated well, no immediate complications Procedure, treatment alternatives, risks and benefits explained, specific risks discussed. Consent was given by the patient. Immediately prior to procedure a time out was called to verify the correct patient, procedure, equipment, support staff and site/side marked as required. Patient was prepped and draped in the usual sterile fashion.       Clinical Data: No additional findings.  Objective: Vital Signs: There were no vitals taken for this visit.  Physical Exam:   Constitutional: Patient appears well-developed HEENT:  Head: Normocephalic Eyes:EOM are normal Neck: Normal range of motion Cardiovascular: Normal rate Pulmonary/chest: Effort normal Neurologic: Patient is alert Skin: Skin is warm Psychiatric: Patient has normal mood and affect    Ortho Exam: Orthopedic exam demonstrates good cervical spine range of motion with 5 out of 5 grip EPL FPL interosseous wrist flexion wrist extension biceps triceps and deltoid strength.  No other masses lymphadenopathy or skin changes noted in the neck or shoulder girdle region.  Patient does have some supraspinatus weakness and mild infraspinatus weakness to muscle testing bilaterally.  Patient has positive impingement signs but also has abduction forward flexion both above 90 degrees.:  No specialty comments available.  Imaging: No results found.   PMFS History: Patient Active Problem List   Diagnosis Date Noted  .  Osteopenia 08/17/2017  . Constipation 07/07/2017  . GAD (generalized anxiety disorder) 07/07/2017  . Depression, major, single episode, moderate ([place]) 07/01/2015  . Ankle sprain 02/27/2015  . OA (osteoarthritis) 02/27/2015  . Hip bursitis 02/27/2015  . Essential hypertension, benign 01/16/2015  . Diabetes mellitus type 2, controlled ([place]) 01/16/2015  . Hyperlipidemia 01/16/2015  . Left shoulder pain 01/16/2015  . Obesity 01/16/2015  . Glaucoma  01/16/2015   Past Medical History:  Diagnosis Date  . Allergy    seasonal allergies  . Anxiety   . Arthritis   . Diabetes mellitus   . Glaucoma   . Hyperlipidemia   . Hypertension   . Osteopenia     Family History  Problem Relation Age of Onset  . Arthritis Mother   . Cancer Sister   . Diabetes Sister   . Hyperlipidemia Sister   . Hypertension Sister     Past Surgical History:  Procedure Laterality Date  . ABDOMINAL HYSTERECTOMY     Social History   Occupational History  . Not on file  Tobacco Use  . Smoking status: Former Smoker    Types: Cigarettes    Last attempt to quit: 09/08/1981    Years since quitting: 36.3  . Smokeless tobacco: Never Used  Substance and Sexual Activity  . Alcohol use: No  . Drug use: No  . Sexual activity: Not Currently

## 2017-12-27 DIAGNOSIS — Z800 Family history of malignant neoplasm of digestive organs: Secondary | ICD-10-CM | POA: Diagnosis not present

## 2017-12-27 DIAGNOSIS — Z1211 Encounter for screening for malignant neoplasm of colon: Secondary | ICD-10-CM | POA: Diagnosis not present

## 2017-12-27 DIAGNOSIS — Z86010 Personal history of colonic polyps: Secondary | ICD-10-CM | POA: Diagnosis not present

## 2017-12-27 LAB — HM COLONOSCOPY

## 2017-12-29 ENCOUNTER — Encounter: Payer: Self-pay | Admitting: Family Medicine

## 2018-01-03 ENCOUNTER — Other Ambulatory Visit: Payer: Self-pay | Admitting: Family Medicine

## 2018-01-19 ENCOUNTER — Ambulatory Visit (INDEPENDENT_AMBULATORY_CARE_PROVIDER_SITE_OTHER): Payer: Medicare Other | Admitting: Family Medicine

## 2018-01-19 ENCOUNTER — Other Ambulatory Visit: Payer: Self-pay

## 2018-01-19 ENCOUNTER — Encounter: Payer: Self-pay | Admitting: Family Medicine

## 2018-01-19 VITALS — BP 120/64 | HR 96 | Temp 98.9°F | Resp 14 | Ht 68.0 in | Wt 202.0 lb

## 2018-01-19 DIAGNOSIS — N644 Mastodynia: Secondary | ICD-10-CM

## 2018-01-19 NOTE — Patient Instructions (Signed)
Call if you have recurrence of pain Mammogram will be done at that time F/U as previous

## 2018-01-19 NOTE — Progress Notes (Signed)
   Subjective:    Patient ID: Victoria Mcconnell, female    DOB: 28-Sep-1945, 72 y.o.   MRN: [id number]  Patient presents for L Breast Pain (x1 week- outer side of breast has pain and then burning sensation)  She here secondary to burning sensation she felt in her left wrist as well as some discomfort.  She admits that she had been helping to move her sister who is dead weight she did not have any help.  And she often pulls her against her chest.  She also thinks that she may have bruised it against the door when trying to open it.  Pain started last week.  Has been intermittent.  Denies any pain with taking a deep breath.  Denies any shortness of breath.  She has not put anything on it.  Has not had any pain yesterday or today want to have it checked.  Had a normal mammogram in October 2018 denies any discharge from the breast   Review Of Systems:  GEN- denies fatigue, fever, weight loss,weakness, recent illness HEENT- denies eye drainage, change in vision, nasal discharge, CVS- denies chest pain, palpitations RESP- denies SOB, cough, wheeze Neuro- denies headache, dizziness, syncope, seizure activity       Objective:    BP 120/64   Pulse 96   Temp 98.9 F (37.2 C) (Oral)   Resp 14   Ht 5\' 8"  (1.727 m)   Wt 202 lb (91.6 kg)   SpO2 100%   BMI 30.71 kg/m  GEN- NAD, alert and oriented x3 CVS- RRR, no murmur RESP-CTAB Breast- normal symmetry, no nipple inversion,no nipple drainage, no nodules or lumps felt Nodes- no axillary nodes           Assessment & Plan:      Problem List Items Addressed This Visit    None    Visit Diagnoses    Breast pain, left    -  Primary   Normal breast exam, no current pain, no nodules palpated, more MSK, can use NSAID first if pain reoccurs, discussed if she has worsening pain, swelling in breast mammogram to be done      Note: This dictation was prepared with Dragon dictation along with smaller phrase technology. Any transcriptional errors  that result from this process are unintentional.

## 2018-02-28 DIAGNOSIS — H524 Presbyopia: Secondary | ICD-10-CM | POA: Diagnosis not present

## 2018-02-28 DIAGNOSIS — H25013 Cortical age-related cataract, bilateral: Secondary | ICD-10-CM | POA: Diagnosis not present

## 2018-02-28 DIAGNOSIS — H2513 Age-related nuclear cataract, bilateral: Secondary | ICD-10-CM | POA: Diagnosis not present

## 2018-02-28 DIAGNOSIS — E119 Type 2 diabetes mellitus without complications: Secondary | ICD-10-CM | POA: Diagnosis not present

## 2018-02-28 DIAGNOSIS — Z7984 Long term (current) use of oral hypoglycemic drugs: Secondary | ICD-10-CM | POA: Diagnosis not present

## 2018-02-28 LAB — HM DIABETES EYE EXAM

## 2018-03-02 ENCOUNTER — Encounter: Payer: Self-pay | Admitting: *Deleted

## 2018-03-17 ENCOUNTER — Other Ambulatory Visit: Payer: Self-pay | Admitting: Family Medicine

## 2018-04-16 ENCOUNTER — Other Ambulatory Visit: Payer: Self-pay | Admitting: Family Medicine

## 2018-04-18 ENCOUNTER — Encounter: Payer: Self-pay | Admitting: Family Medicine

## 2018-04-18 ENCOUNTER — Other Ambulatory Visit: Payer: Self-pay

## 2018-04-18 ENCOUNTER — Ambulatory Visit (INDEPENDENT_AMBULATORY_CARE_PROVIDER_SITE_OTHER): Payer: Medicare Other | Admitting: Family Medicine

## 2018-04-18 VITALS — BP 120/62 | HR 86 | Temp 98.3°F | Resp 14 | Ht 68.0 in | Wt 209.0 lb

## 2018-04-18 DIAGNOSIS — E119 Type 2 diabetes mellitus without complications: Secondary | ICD-10-CM

## 2018-04-18 DIAGNOSIS — N644 Mastodynia: Secondary | ICD-10-CM

## 2018-04-18 DIAGNOSIS — I10 Essential (primary) hypertension: Secondary | ICD-10-CM | POA: Diagnosis not present

## 2018-04-18 DIAGNOSIS — F411 Generalized anxiety disorder: Secondary | ICD-10-CM | POA: Diagnosis not present

## 2018-04-18 MED ORDER — ALPRAZOLAM 1 MG PO TABS: 1.0000 mg | ORAL_TABLET | Freq: Two times a day (BID) | ORAL | 1 refills | Status: DC | PRN

## 2018-04-18 MED ORDER — ESCITALOPRAM OXALATE 5 MG PO TABS: 5.0000 mg | ORAL_TABLET | Freq: Every day | ORAL | 3 refills | Status: DC

## 2018-04-18 NOTE — Assessment & Plan Note (Addendum)
Blood pressure controlled no changes 

## 2018-04-18 NOTE — Assessment & Plan Note (Signed)
Significant anxiety she has not taken the medications as prescribed.  Discussed with her importance of taking the Lexapro every day to get it in her system regularly.  Xanax can be used as needed.  She is just fearful that she is going to die from something soon because of her family members and her husband dying not too long ago.

## 2018-04-18 NOTE — Progress Notes (Signed)
Subjective:    Patient ID: Victoria Mcconnell, female    DOB: 07-04-1946, 72 y.o.   MRN: [id number]  Patient presents for Follow-up (is fasting)  Pt here to f/u chronic medical problems  HTN-blood pressure medicine as prescribed.  GAD- She is not taking the lexapro as prescribed and  Out of xanax.  High anxiety she is worried about everything.  She keeps bringing up her breast pain states that she still gets some discomfort in the area when I would ask if she is truly having pain has felt anything she will say no is not a big deal but then states she is been playing all, topical pain rubs on her left breast.  States that she only feels it when she tries to pick up her sister who is special needs.  Story has been very confusing back-and-forth.  She is worried about having cancer somewhere in her body.  DM- off metformin last A1C 6% in [place]; , fasting CBG was up 129 , admits to eating out some with a few more sweets   Told by HHnurse did A1C 6.8%, told she had a heart murmur   OA- takes aleve, gets pain in joint has known degenerative disc disease.  Takes Aleve which helps.  Taking Vitamin D  Review Of Systems:  GEN- denies fatigue, fever, weight loss,weakness, recent illness HEENT- denies eye drainage, change in vision, nasal discharge, CVS- denies chest pain, palpitations RESP- denies SOB, cough, wheeze ABD- denies N/V, change in stools, abd pain GU- denies dysuria, hematuria, dribbling, incontinence MSK- +joint pain, muscle aches, injury Neuro- denies headache, dizziness, syncope, seizure activity       Objective:    BP 120/62   Pulse 86   Temp 98.3 F (36.8 C) (Oral)   Resp 14   Ht 5\' 8"  (1.727 m)   Wt 209 lb (94.8 kg)   SpO2 97%   BMI 31.78 kg/m  GEN- NAD, alert and oriented x3 HEENT- PERRL, EOMI, non injected sclera, pink conjunctiva, MMM, oropharynx clear Neck- Supple, no thyromegaly, good ROM CVS- RRR, no murmur RESP-CTAB ABD-NABS,soft,NT,ND Psych- very anxious,  not depressed appearing, no SI, well groomed, normal speech  MSK- Spine NT, fair ROM, neg SLR, fair ROM bilat knee, no effusion EXT- No edema Pulses- Radial  2+        Assessment & Plan:      Problem List Items Addressed This Visit      Unprioritized   Diabetes mellitus type 2, controlled ([place]) - Primary    Recheck A1C here in office, discussed what A1C test is again with her, she is adamanet her sugar has been up something that she had eaten the day that the nurse came by her house. Currently holding on metformin.       Relevant Orders   Basic metabolic panel   Hemoglobin A1c   Essential hypertension, benign    Blood pressure controlled no changes.      GAD (generalized anxiety disorder)    Significant anxiety she has not taken the medications as prescribed.  Discussed with her importance of taking the Lexapro every day to get it in her system regularly.  Xanax can be used as needed.  She is just fearful that she is going to die from something soon because of her family members and her husband dying not too long ago.       Other Visit Diagnoses    Breast pain, left       Very  high anxiety regarding the breast pain.  I recommend that we go ahead and proceed with mammogram Gust with her that even though not was not palpated she does not have any mass to cannot see things like calcifications or smaller lesions in the breast.  She agrees to go for the mammogram feels that will also ease her mind.   Relevant Orders   MM DIAG BREAST TOMO BILATERAL   US BREAST COMPLETE UNI LEFT INC AXILLA      Note: This dictation was prepared with Dragon dictation along with smaller phrase technology. Any transcriptional errors that result from this process are unintentional.

## 2018-04-18 NOTE — Patient Instructions (Addendum)
F/U physical December  Get your healthcare living will/advanced directives done  Mammogram to be done  Take the lexapro once a day and xanax as needed  We will call with lab results

## 2018-04-18 NOTE — Assessment & Plan Note (Signed)
Recheck A1C here in office, discussed what A1C test is again with her, she is adamanet her sugar has been up something that she had eaten the day that the nurse came by her house. Currently holding on metformin.

## 2018-04-19 LAB — BASIC METABOLIC PANEL
BUN / CREAT RATIO: 17 (calc) (ref 6–22)
BUN: 19 mg/dL (ref 7–25)
CHLORIDE: 107 mmol/L (ref 98–110)
CO2: 25 mmol/L (ref 20–32)
CREATININE: 1.11 mg/dL — AB (ref 0.60–0.93)
Calcium: 10 mg/dL (ref 8.6–10.4)
GLUCOSE: 103 mg/dL — AB (ref 65–99)
Potassium: 4.2 mmol/L (ref 3.5–5.3)
Sodium: 140 mmol/L (ref 135–146)

## 2018-04-19 LAB — HEMOGLOBIN A1C
Hgb A1c MFr Bld: 6.2 % of total Hgb — ABNORMAL HIGH (ref <5.7)
Mean Plasma Glucose: 131 (calc)
eAG (mmol/L): 7.3 (calc)

## 2018-04-21 ENCOUNTER — Encounter: Payer: Self-pay | Admitting: *Deleted

## 2018-05-03 ENCOUNTER — Ambulatory Visit (HOSPITAL_COMMUNITY)
Admission: RE | Admit: 2018-05-03 | Discharge: 2018-05-03 | Disposition: A | Discharge status: Home / Self Care | Payer: Medicare Other | Source: Ambulatory Visit | Attending: Family Medicine | Admitting: Family Medicine

## 2018-05-03 ENCOUNTER — Encounter (HOSPITAL_COMMUNITY): Payer: Self-pay

## 2018-05-03 DIAGNOSIS — N644 Mastodynia: Secondary | ICD-10-CM | POA: Insufficient documentation

## 2018-05-03 DIAGNOSIS — R922 Inconclusive mammogram: Secondary | ICD-10-CM | POA: Diagnosis not present

## 2018-05-09 DIAGNOSIS — L669 Cicatricial alopecia, unspecified: Secondary | ICD-10-CM | POA: Diagnosis not present

## 2018-05-09 DIAGNOSIS — L709 Acne, unspecified: Secondary | ICD-10-CM | POA: Diagnosis not present

## 2018-05-09 DIAGNOSIS — L819 Disorder of pigmentation, unspecified: Secondary | ICD-10-CM | POA: Diagnosis not present

## 2018-05-09 DIAGNOSIS — L731 Pseudofolliculitis barbae: Secondary | ICD-10-CM | POA: Diagnosis not present

## 2018-05-09 DIAGNOSIS — L659 Nonscarring hair loss, unspecified: Secondary | ICD-10-CM | POA: Diagnosis not present

## 2018-05-09 DIAGNOSIS — L680 Hirsutism: Secondary | ICD-10-CM | POA: Diagnosis not present

## 2018-05-09 DIAGNOSIS — L738 Other specified follicular disorders: Secondary | ICD-10-CM | POA: Diagnosis not present

## 2018-05-09 DIAGNOSIS — L239 Allergic contact dermatitis, unspecified cause: Secondary | ICD-10-CM | POA: Diagnosis not present

## 2018-05-09 DIAGNOSIS — L700 Acne vulgaris: Secondary | ICD-10-CM | POA: Diagnosis not present

## 2018-05-09 DIAGNOSIS — L818 Other specified disorders of pigmentation: Secondary | ICD-10-CM | POA: Diagnosis not present

## 2018-05-09 DIAGNOSIS — L811 Chloasma: Secondary | ICD-10-CM | POA: Diagnosis not present

## 2018-05-18 ENCOUNTER — Encounter: Payer: Self-pay | Admitting: Family Medicine

## 2018-05-18 ENCOUNTER — Telehealth: Payer: Self-pay | Admitting: *Deleted

## 2018-05-18 ENCOUNTER — Ambulatory Visit (INDEPENDENT_AMBULATORY_CARE_PROVIDER_SITE_OTHER): Payer: Medicare Other | Admitting: Family Medicine

## 2018-05-18 ENCOUNTER — Other Ambulatory Visit: Payer: Self-pay

## 2018-05-18 VITALS — BP 130/64 | HR 82 | Temp 98.3°F | Resp 14 | Ht 68.0 in | Wt 206.0 lb

## 2018-05-18 DIAGNOSIS — M8589 Other specified disorders of bone density and structure, multiple sites: Secondary | ICD-10-CM

## 2018-05-18 DIAGNOSIS — Z780 Asymptomatic menopausal state: Secondary | ICD-10-CM | POA: Diagnosis not present

## 2018-05-18 DIAGNOSIS — M5441 Lumbago with sciatica, right side: Secondary | ICD-10-CM

## 2018-05-18 DIAGNOSIS — Z23 Encounter for immunization: Secondary | ICD-10-CM | POA: Diagnosis not present

## 2018-05-18 MED ORDER — PREDNISONE 20 MG PO TABS: 40.0000 mg | ORAL_TABLET | Freq: Every day | ORAL | 0 refills | Status: AC

## 2018-05-18 MED ORDER — METHOCARBAMOL 500 MG PO TABS: 500.0000 mg | ORAL_TABLET | Freq: Three times a day (TID) | ORAL | 0 refills | Status: DC

## 2018-05-18 NOTE — Progress Notes (Signed)
Patient ID: MAKYNZEE TIGGES, female    DOB: [birth year], 72 y.o.   MRN: [id number]  PCP: Alycia Rossetti, MD  Chief Complaint  Patient presents with  . R Hip Pain    x4 days- R lower back pain that radiates down to R hip and R knee    Subjective:   ARAINA BUTRICK is a 72 y.o. female, presents to clinic with CC of right low back pain with radiation down right leg x 4 days Patient presents with 4 days of gradually worsening back pain, she has had arthritis and back pain in the past but it was several years ago, 2010.  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    Back Pain  This is a new problem. The current episode started in the past 7 days (4 days ago). The problem occurs constantly. The problem has been gradually worsening since onset. The pain is present in the gluteal and lumbar spine. The quality of the pain is described as burning. The pain radiates to the right thigh and right knee. The pain is at a severity of 5/10. The pain is moderate. Worse during: worse first thing in the morning with stiffness and worse with any sitting or period of immobility. The symptoms are aggravated by bending, sitting and twisting. Associated symptoms include leg pain. Pertinent negatives include no abdominal pain, bladder incontinence, bowel incontinence, chest pain, dysuria, fever, headaches, numbness, paresis, paresthesias, pelvic pain, perianal numbness, tingling, weakness or weight loss. Risk factors include menopause and obesity (hx of osteopenia). Treatments tried: tylenol and topical pain cream from pharmacy and bengay cream. The treatment provided mild relief.   Patient denies any history of renal disease, GI bleed, gastritis, GERD. She denies any weight loss, abdominal pain, saddle anesthesia, incontinence of stool or bowels.  She denies  any numbness or weakness to her lower extremities.  Patient Active Problem List   Diagnosis Date Noted  . Osteopenia 08/17/2017  . Constipation 07/07/2017  . GAD (generalized anxiety disorder) 07/07/2017  . Depression, major, single episode, moderate ([place]) 07/01/2015  . Ankle sprain 02/27/2015  . OA (osteoarthritis) 02/27/2015  . Hip bursitis 02/27/2015  . Essential hypertension, benign 01/16/2015  . Diabetes mellitus type 2, controlled ([place]) 01/16/2015  . Hyperlipidemia 01/16/2015  . Left shoulder pain 01/16/2015  . Obesity 01/16/2015  . Glaucoma 01/16/2015     Prior to Admission medications   Medication Sig Start Date End Date Taking? Authorizing Provider  ACCU-CHEK AVIVA PLUS test strip USE WITH METER TO CHECK  BLOOD GLUCOSE 2 TIMES DAILY 03/18/18  Yes [place], Modena Nunnery, MD  ALPRAZolam Duanne Moron) 1 MG tablet Take 1 tablet (1 mg total) by mouth 2 (two) times daily as needed. 04/18/18  Yes [place], Modena Nunnery, MD  amLODipine (NORVASC) 5 MG tablet TAKE 1 TABLET BY MOUTH  DAILY 03/18/18  Yes [place], Modena Nunnery, MD  atorvastatin (LIPITOR) 20 MG tablet TAKE 1 TABLET BY MOUTH  DAILY 04/18/18  Yes [place], Modena Nunnery, MD  cholecalciferol (VITAMIN D) 1000 units tablet Take 1 tablet (1,000 Units total) daily by mouth. 07/22/17  Yes [place], Modena Nunnery, MD  escitalopram (LEXAPRO) 5 MG tablet Take 1 tablet (5 mg total) by mouth daily. 04/18/18  Yes [place], Modena Nunnery, MD  latanoprost (XALATAN) 0.005 % ophthalmic solution Place 1  drop into both eyes at bedtime.   Yes [provider]  linaclotide (LINZESS) 72 MCG capsule Take 1 capsule (72 mcg total) by mouth daily before breakfast. 08/18/17  Yes [place], Modena Nunnery, MD  metFORMIN (GLUCOPHAGE) 1000 MG tablet TAKE 1 TABLET BY MOUTH  TWICE A DAY WITH MEALS 04/18/18  Yes [place], Modena Nunnery, MD  montelukast (SINGULAIR) 10 MG tablet TAKE 1 TABLET BY MOUTH AT  BEDTIME 03/18/18  Yes [place], Modena Nunnery, MD  naproxen sodium (ANAPROX) 220 MG tablet Take 220 mg by mouth 2 (two)  times daily with a meal.   Yes [provider]  tretinoin (RETIN-A) 0.025 % cream  11/02/14  Yes [provider]  valsartan-hydrochlorothiazide (DIOVAN-HCT) 160-25 MG tablet TAKE 1 TABLET BY MOUTH  DAILY 03/18/18  Yes [place], Modena Nunnery, MD     No Known Allergies   Family History  Problem Relation Age of Onset  . Arthritis Mother   . Cancer Sister   . Diabetes Sister   . Hyperlipidemia Sister   . Hypertension Sister      Social History   Socioeconomic History  . Marital status: Married    Spouse name: Not on file  . Number of children: Not on file  . Years of education: Not on file  . Highest education level: Not on file  Occupational History  . Not on file  Social Needs  . Financial resource strain: Not on file  . Food insecurity:    Worry: Not on file    Inability: Not on file  . Transportation needs:    Medical: Not on file    Non-medical: Not on file  Tobacco Use  . Smoking status: Former Smoker    Types: Cigarettes    Last attempt to quit: 09/08/1981    Years since quitting: 36.7  . Smokeless tobacco: Never Used  Substance and Sexual Activity  . Alcohol use: No  . Drug use: No  . Sexual activity: Not Currently  Lifestyle  . Physical activity:    Days per week: Not on file    Minutes per session: Not on file  . Stress: Not on file  Relationships  . Social connections:    Talks on phone: Not on file    Gets together: Not on file    Attends religious service: Not on file    Active member of club or organization: Not on file    Attends meetings of clubs or organizations: Not on file    Relationship status: Not on file  . Intimate partner violence:    Fear of current or ex partner: Not on file    Emotionally abused: Not on file    Physically abused: Not on file    Forced sexual activity: Not on file  Other Topics Concern  . Not on file  Social History Narrative  . Not on file     Review of Systems  Constitutional: Negative.   Negative for activity change, appetite change, chills, diaphoresis, fatigue, fever, unexpected weight change and weight loss.  HENT: Negative.   Eyes: Negative.   Respiratory: Negative.   Cardiovascular: Negative.  Negative for chest pain.  Gastrointestinal: Negative.  Negative for abdominal pain and bowel incontinence.  Endocrine: Negative.   Genitourinary: Negative for bladder incontinence, dysuria and pelvic pain.  Musculoskeletal: Positive for back pain. Negative for arthralgias, gait problem, joint swelling, myalgias, neck pain and neck stiffness.  Skin: Negative.  Negative for color change.  Allergic/Immunologic: Negative.   Neurological:  Negative.  Negative for tingling, tremors, weakness, numbness, headaches and paresthesias.  Psychiatric/Behavioral: Negative.   All other systems reviewed and are negative.      Objective:    Vitals:   05/18/18 1055  BP: 130/64  Pulse: 82  Resp: 14  Temp: 98.3 F (36.8 C)  TempSrc: Oral  SpO2: 99%  Weight: 206 lb (93.4 kg)  Height: 5\' 8"  (1.727 m)      Physical Exam  Constitutional: She appears well-developed and well-nourished. No distress.  HENT:  Head: Normocephalic and atraumatic.  Nose: Nose normal.  Mouth/Throat: Oropharynx is clear and moist.  Eyes: Conjunctivae are normal. Right eye exhibits no discharge. Left eye exhibits no discharge.  Neck: No tracheal deviation present.  Cardiovascular: Normal rate and regular rhythm.  Pulses:      Radial pulses are 2+ on the right side, and 2+ on the left side.       Dorsalis pedis pulses are 2+ on the right side, and 2+ on the left side.       Posterior tibial pulses are 2+ on the right side, and 2+ on the left side.  Pulmonary/Chest: Effort normal. No stridor. No respiratory distress.  Abdominal: Soft. Bowel sounds are normal. She exhibits no distension. There is no tenderness.  Musculoskeletal: Normal range of motion.  Positive straight leg raise on the right, negative left  straight leg raise Tenderness to palpation at right lumbar paraspinal muscle and right SI joint No midline tenderness from cervical to lumbar spine, no step-off Normal range of motion of back, bilateral hips and bilateral knees 5/5 strength bilaterally with dorsiflexion, plantarflexion, flexion extension at the hips Grossly intact sensation to light touch in bilateral lower extremities  Neurological: She is alert. She exhibits normal muscle tone. Coordination normal.  Skin: Skin is warm and dry. No rash noted. She is not diaphoretic.  Psychiatric: She has a normal mood and affect. Her behavior is normal.  Nursing note and vitals reviewed.         Assessment & Plan:      ICD-10-CM   1. Acute right-sided low back pain with right-sided sciatica M54.41   Suspect right lumbar strain with some sciatic features secondary to increased strenuous activity.  Treat with rest eating from heavy lifting, frequent position changing, heat therapy, patient will continue to take Tylenol as needed for pain and will try Robaxin at night for muscle tightness and to help sleep.  We will start a steroid burst for 5 days.  Have discussed a gradual improvement of back and leg pain patient is to follow-up in 2 weeks if not improving at least about 50%. She denies any past chronic kidney disease, GI bleed, GERD or gastritis, denies ever being told to avoid NSAIDs so after steroids are completed, roughly 1 week from now she will use Aleve or naproxen as needed.   Will refer to physical therapy not starting to improve in 2 weeks.  Is overdue for DEXA scan with history of osteopenia, will order.  Do not feel any other imaging is currently indicated.  Lumbar spine films 2010 were reviewed which showed mild disc space narrowing of the lumbar spine and advanced facet arthropathy and lower lumbar spine.  Would obtain some imaging either repeated plain films or MRI if patient has any worsening of pain or any prolonged pain past  1 or 2 months that is not improving with time, support and physical therapy.  Currently no red flags concerning for cauda equina or acute  osseous injury.    Delsa Grana, PA-C 05/18/18 11:19 AM

## 2018-05-18 NOTE — Patient Instructions (Addendum)
Call me in 1-2 weeks if not improving and we will send to physical therapy  Take the steroids and when finished you can do over the counter meds (motrin/aleve/naproxen)    Sciatica Sciatica is pain, numbness, weakness, or tingling along your sciatic nerve. The sciatic nerve starts in the lower back and goes down the back of each leg. Sciatica happens when this nerve is pinched or has pressure put on it. Sciatica usually goes away on its own or with treatment. Sometimes, sciatica may keep coming back (recur). Follow these instructions at home: Medicines  Take over-the-counter and prescription medicines only as told by your doctor.  Do not drive or use heavy machinery while taking prescription pain medicine. Managing pain  If directed, put ice on the affected area. ? Put ice in a plastic bag. ? Place a towel between your skin and the bag. ? Leave the ice on for 20 minutes, 2-3 times a day.  After icing, apply heat to the affected area before you exercise or as often as told by your doctor. Use the heat source that your doctor tells you to use, such as a moist heat pack or a heating pad. ? Place a towel between your skin and the heat source. ? Leave the heat on for 20-30 minutes. ? Remove the heat if your skin turns bright red. This is especially important if you are unable to feel pain, heat, or cold. You may have a greater risk of getting burned. Activity  Return to your normal activities as told by your doctor. Ask your doctor what activities are safe for you. ? Avoid activities that make your sciatica worse.  Take short rests during the day. Rest in a lying or standing position. This is usually better than sitting to rest. ? When you rest for a long time, do some physical activity or stretching between periods of rest. ? Avoid sitting for a long time without moving. Get up and move around at least one time each hour.  Exercise and stretch regularly, as told by your doctor.  Do not  lift anything that is heavier than 10 lb (4.5 kg) while you have symptoms of sciatica. ? Avoid lifting heavy things even when you do not have symptoms. ? Avoid lifting heavy things over and over.  When you lift objects, always lift in a way that is safe for your body. To do this, you should: ? Bend your knees. ? Keep the object close to your body. ? Avoid twisting. General instructions  Use good posture. ? Avoid leaning forward when you are sitting. ? Avoid hunching over when you are standing.  Stay at a healthy weight.  Wear comfortable shoes that support your feet. Avoid wearing high heels.  Avoid sleeping on a mattress that is too soft or too hard. You might have less pain if you sleep on a mattress that is firm enough to support your back.  Keep all follow-up visits as told by your doctor. This is important. Contact a doctor if:  You have pain that: ? Wakes you up when you are sleeping. ? Gets worse when you lie down. ? Is worse than the pain you have had in the past. ? Lasts longer than 4 weeks.  You lose weight for without trying. Get help right away if:  You cannot control when you pee (urinate) or poop (have a bowel movement).  You have weakness in any of these areas and it gets worse. ? Lower back. ?  Lower belly (pelvis). ? Butt (buttocks). ? Legs.  You have redness or swelling of your back.  You have a burning feeling when you pee. This information is not intended to replace advice given to you by your health care provider. Make sure you discuss any questions you have with your health care provider. Document Released: 06/09/2008 Document Revised: 02/06/2016 Document Reviewed: 05/10/2015 Elsevier Interactive Patient Education  [employer].

## 2018-05-18 NOTE — Telephone Encounter (Signed)
Received call from patient.   Reports that she is supposed to be scheduled for bone density test, but order was sent to [facility]. Reports that she always has her bon density tests performed in [place].   Please re-schedule with new order.

## 2018-05-19 NOTE — Telephone Encounter (Signed)
Spoke with patient and informed her of appointment at [facility]. Patient verbalized understanding.

## 2018-05-24 ENCOUNTER — Telehealth: Payer: Self-pay | Admitting: Family Medicine

## 2018-05-24 DIAGNOSIS — M5441 Lumbago with sciatica, right side: Secondary | ICD-10-CM

## 2018-05-24 NOTE — Telephone Encounter (Signed)
Please advise 

## 2018-05-24 NOTE — Telephone Encounter (Signed)
Referred to physical therapy.  With low back strain (with or without sciatica) it usually takes 1-2 months to improve.  PT can help with this.  Now that pt is done with steroids she can try aleve or naproxen and heat therapy, frequent position changes.  Avoid any lifting over 5-10 lbs.

## 2018-05-24 NOTE — Telephone Encounter (Signed)
Call placed to patient and patient made aware.  

## 2018-05-24 NOTE — Telephone Encounter (Signed)
Pt states she was advised to call if her sx did not get any better and they have not. It has not gotten worse but has stayed the same has completed muscle relaxer and prednisone with no relief.

## 2018-05-25 ENCOUNTER — Ambulatory Visit (HOSPITAL_COMMUNITY)
Admission: RE | Admit: 2018-05-25 | Discharge: 2018-05-25 | Disposition: A | Discharge status: Home / Self Care | Payer: Medicare Other | Source: Ambulatory Visit | Attending: Family Medicine | Admitting: Family Medicine

## 2018-05-25 ENCOUNTER — Encounter: Payer: Self-pay | Admitting: *Deleted

## 2018-05-25 ENCOUNTER — Other Ambulatory Visit (HOSPITAL_COMMUNITY): Payer: Medicare Other

## 2018-05-25 DIAGNOSIS — Z780 Asymptomatic menopausal state: Secondary | ICD-10-CM | POA: Insufficient documentation

## 2018-05-25 DIAGNOSIS — M85851 Other specified disorders of bone density and structure, right thigh: Secondary | ICD-10-CM | POA: Insufficient documentation

## 2018-05-25 DIAGNOSIS — M8589 Other specified disorders of bone density and structure, multiple sites: Secondary | ICD-10-CM | POA: Diagnosis not present

## 2018-05-30 ENCOUNTER — Other Ambulatory Visit: Payer: Self-pay | Admitting: Family Medicine

## 2018-05-30 DIAGNOSIS — Z1231 Encounter for screening mammogram for malignant neoplasm of breast: Secondary | ICD-10-CM

## 2018-05-31 DIAGNOSIS — H401131 Primary open-angle glaucoma, bilateral, mild stage: Secondary | ICD-10-CM | POA: Diagnosis not present

## 2018-06-04 ENCOUNTER — Other Ambulatory Visit: Payer: Self-pay | Admitting: Family Medicine

## 2018-06-07 ENCOUNTER — Encounter (HOSPITAL_COMMUNITY): Payer: Self-pay

## 2018-06-07 ENCOUNTER — Ambulatory Visit (HOSPITAL_COMMUNITY): Payer: Medicare Other | Attending: Family Medicine

## 2018-06-07 ENCOUNTER — Other Ambulatory Visit: Payer: Self-pay

## 2018-06-07 DIAGNOSIS — M25551 Pain in right hip: Secondary | ICD-10-CM

## 2018-06-07 DIAGNOSIS — R29898 Other symptoms and signs involving the musculoskeletal system: Secondary | ICD-10-CM | POA: Diagnosis not present

## 2018-06-07 DIAGNOSIS — M6281 Muscle weakness (generalized): Secondary | ICD-10-CM | POA: Diagnosis not present

## 2018-06-07 NOTE — Therapy (Signed)
[place], [place], [address] Phone: [phone]   Fax:  [phone]  Physical Therapy Evaluation  Patient Details  Name: Victoria Mcconnell MRN: [id number] Date of Birth: May 15, 1946 Referring Provider: Delsa Grana, PA-C   Encounter Date: 06/07/2018  PT End of Session - 06/07/18 1205    Visit Number  1    Number of Visits  13    Date for PT Re-Evaluation  07/19/18   mini reassess on 06/28/18   Authorization Type  [employer] Medicare    Authorization Time Period  06/07/18 to 07/19/18    Authorization - Visit Number  1    Authorization - Number of Visits  10    PT Start Time  1194    PT Stop Time  1202    PT Time Calculation (min)  47 min    Activity Tolerance  Patient tolerated treatment well;No increased pain    Behavior During Therapy  WFL for tasks assessed/performed       Past Medical History:  Diagnosis Date  . Allergy    seasonal allergies  . Anxiety   . Arthritis   . Diabetes mellitus   . Glaucoma   . Hyperlipidemia   . Hypertension   . Osteopenia     Past Surgical History:  Procedure Laterality Date  . ABDOMINAL HYSTERECTOMY      There were no vitals filed for this visit.   Subjective Assessment - 06/07/18 1120    Subjective  Pt states that she has pain in her R leg from her buttock down the side of her leg to the knee. Delsa Grana, PA-C prescribed her mm relaxors and Prednisone which did not affect her pain. She noticed it about 4WA and she is unsure of how her pain started. She had this same pain about 10 years ago and PT helped it then. She has intermittent n/t, denies b/b changes since this started, and she denies any LBP, only in the R buttock. She denies any aggravating or relieving movements or positions. It has eased up some since it started. She states that when her pain was really bad, she couldn't sit or walk for long periods of time but this has improved some.     Limitations   Walking;Standing;House hold activities;Sitting    How long can you sit comfortably?  unlimited now    How long can you stand comfortably?  unlimited now    How long can you walk comfortably?  unlimited now    Patient Stated Goals  get this pain out of her leg    Currently in Pain?  Yes    Pain Score  5     Pain Location  Buttocks    Pain Orientation  Right    Pain Descriptors / Indicators  Aching;Dull    Pain Type  Acute pain    Pain Radiating Towards  down to lateral knee    Pain Onset  1 to 4 weeks ago    Pain Frequency  Constant    Aggravating Factors   unsure    Pain Relieving Factors  unsure    Effect of Pain on Daily Activities  limits         [facility] PT Assessment - 06/07/18 0001      Assessment   Medical Diagnosis  acute R sided LBP with R sided sciatica    Referring Provider  Delsa Grana, PA-C    Onset Date/Surgical Date  05/14/18  Next MD Visit  08/2018    Prior Therapy  yes for same thing about 10 years ago      Balance Screen   Has the patient fallen in the past 6 months  No    Has the patient had a decrease in activity level because of a fear of falling?   No    Is the patient reluctant to leave their home because of a fear of falling?   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    Leisure  walking, search the internet      Observation/Other Assessments   Focus on Therapeutic Outcomes (FOTO)   to be completed next visit      Sensation   Light Touch  Appears Intact      ROM / Strength   AROM / PROM / Strength  AROM;Strength      AROM   AROM Assessment Site  Lumbar    Lumbar Flexion  25% limited, in lordosis, RFIS x10 increased    Lumbar Extension  in lordosis, REIS x10 decreased back to baseline    Lumbar - Right Side Bend  knee joint, no pain    Lumbar - Left Side Bend  knee joint, pull on R    Lumbar - Right Rotation  WFL, no pain    Lumbar - Left Rotation   WFL, pull on R      Strength   Strength Assessment Site  Hip;Knee;Ankle    Right Hip Flexion  4/5    Right Hip Extension  3/5    Right Hip External Rotation   5/5    Right Hip Internal Rotation  5/5    Right Hip ABduction  4/5    Left Hip Flexion  5/5    Left Hip Extension  3+/5    Left Hip External Rotation  5/5    Left Hip Internal Rotation  5/5    Left Hip ABduction  4/5    Right Knee Extension  5/5    Left Knee Extension  5/5    Right Ankle Dorsiflexion  5/5    Left Ankle Dorsiflexion  5/5      Flexibility   Soft Tissue Assessment /Muscle Length  yes    Hamstrings  SLR on R tighter than on L (see SLR findings below), 90/90 WFL BLE    Quadriceps  +Ely's R>L, recreated RLE pain    Piriformis  tighter on R than L during supine assessment      Palpation   Spinal mobility  hypomobile lumbar and thoracic spine    Palpation comment  increased restriction bil lumbar paraspinals, bil glutes and VLO R>L throughout; palpation to glutes and VLO on R recreated her same pain      Special Tests    Special Tests  Lumbar    Lumbar Tests  Slump Test;Straight Leg Raise      Slump test   Findings  Positive    Side  Right    Comment  recreated her same pain on R lateral leg      Straight Leg Raise   Findings  Negative    Side   Right    Comment  53deg and recreated her R lateral leg pain      Ambulation/Gait   Ambulation Distance (Feet)  756 Feet   3MWT   Assistive  device  None    Gait Pattern  Step-through pattern;Trendelenburg    Gait Comments  increased R lateral thigh "burning" pain      Balance   Balance Assessed  Yes      Static Standing Balance   Static Standing - Balance Support  No upper extremity supported    Static Standing Balance -  Activities   Single Leg Stance - Right Leg;Single Leg Stance - Left Leg    Static Standing - Comment/# of Minutes  R: 7.6sec or <, trunk lean R indicating r hip weakness; L: 26.8sec, trunk lean to the L, indicating L hip weakness              Objective measurements completed on examination: See above findings.          PT Education - 06/07/18 1205    Education Details  exam findings, POC, HEP    Person(s) Educated  Patient    Methods  Explanation;Demonstration;Handout    Comprehension  Verbalized understanding;Returned demonstration       PT Short Term Goals - 06/07/18 1248      PT SHORT TERM GOAL #1   Title  Pt will be independent with HEP and perform consistently in order to decrease pain and promote return to PLOF.     Time  3    Period  Weeks    Status  New    Target Date  06/28/18      PT SHORT TERM GOAL #2   Title  Pt will have improved R SLS to 15 sec or > in order demo improved core and hip strength.    Time  3    Period  Weeks    Status  New      PT SHORT TERM GOAL #3   Title  Pt will be able to perform 3MWT without increases in R buttock or lateral thigh pain to demo improved functional strength and maximize her ability to return to her regular walking program.    Time  3    Period  Weeks    Status  New        PT Long Term Goals - 06/07/18 1249      PT LONG TERM GOAL #1   Title  Pt will have 1 grade improvement in MMT throughout proximal hips in order to decrease pain and maximize her ability to physically assist her sister.     Time  6    Period  Weeks    Status  New    Target Date  07/19/18      PT LONG TERM GOAL #2   Title  Pt will be able to perform bil SLS for 30sec or > in order to further demo improved core and functional hip strength.    Time  6    Period  Weeks    Status  New      PT LONG TERM GOAL #3   Title  Pt will report decreased overall pain to 2/10 or better on a daily basis to demo improved overall function and maximize ADL and IADL completion.     Time  6    Period  Weeks    Status  New      PT LONG TERM GOAL #4   Title  Pt will report being able to perform all physical assistance for her sister independently with proper form in order to demo  improve core and functional strength to demo return to PLOF  and decrease risk for reinjury.    Time  6    Period  Weeks    Status  New             Plan - 06/07/18 1246    Clinical Impression Statement  Pt is pleasant 72YO F who presents to OPPT with c/o R buttock pain that radiates down her lateral leg to her knee. This started insidiously approximately 4WA but does state that she was doing a lot of heavy lifting with rearranging/remodeling her house and noticed that her pain started after that. She has deficits in MMT (especially proximal hips, R>L), core strength, balance, functional strength, and flexibility, as well as increased soft tissue restrictions of glutes and VLO. Palpation to this musculature recreated her same pain and palpation to her glute med initially recreated her referred pain down her lateral thigh. Slump testing recreated her same lateral thigh pain and supine passive SLR on the R recreated it at 53deg of hip flexion. PT feels pt's s/s are most likely MSK in nature due to trigger points in glute med and VLO, but will continue to monitor and assess any neurological involvement throughout her sessions. Pt needs skilled PT intervention to address these impairments in order to decrease pain and promote return to PLOF.    Clinical Presentation  Stable    Clinical Presentation due to:  see objective tests and measures in flowsheets    Clinical Decision Making  Low    Rehab Potential  Good    PT Frequency  2x / week    PT Duration  6 weeks    PT Treatment/Interventions  ADLs/Self Care Home Management;Aquatic Therapy;Cryotherapy;Electrical Stimulation;Traction;Ultrasound;Gait training;Functional mobility training;Therapeutic activities;Stair training;Therapeutic exercise;Moist Heat;Balance training;Neuromuscular re-education;Patient/family education;Manual techniques;Passive range of motion;Dry needling;Energy conservation;Taping;Spinal Manipulations;Joint Manipulations    PT Next  Visit Plan  review goals and HEP; initiate core, BLE, functional strenghtening, manual STM for restrictions in R glute med and R VLO; assess lifting/transfer form later once strength and pain have improved    PT Home Exercise Plan  eval: supine figure 4, supine piriformis stretch    Consulted and Agree with Plan of Care  Patient       Patient will benefit from skilled therapeutic intervention in order to improve the following deficits and impairments:  Decreased activity tolerance, Decreased balance, Decreased range of motion, Decreased strength, Difficulty walking, Hypomobility, Increased fascial restricitons, Increased muscle spasms, Impaired flexibility, Improper body mechanics, Postural dysfunction, Pain  Visit Diagnosis: Pain in right hip - Plan: PT plan of care cert/re-cert  Muscle weakness (generalized) - Plan: PT plan of care cert/re-cert  Other symptoms and signs involving the musculoskeletal system - Plan: PT plan of care cert/re-cert     Problem List Patient Active Problem List   Diagnosis Date Noted  . Osteopenia 08/17/2017  . Constipation 07/07/2017  . GAD (generalized anxiety disorder) 07/07/2017  . Depression, major, single episode, moderate ([place]) 07/01/2015  . Ankle sprain 02/27/2015  . OA (osteoarthritis) 02/27/2015  . Hip bursitis 02/27/2015  . Essential hypertension, benign 01/16/2015  . Diabetes mellitus type 2, controlled ([place]) 01/16/2015  . Hyperlipidemia 01/16/2015  . Left shoulder pain 01/16/2015  . Obesity 01/16/2015  . Glaucoma 01/16/2015        Geraldine Solar PT, DPT  [facility] [address] [place], [place], [address] Phone: [phone]   Fax:  [phone]  Name: Victoria Mcconnell MRN: [id number] Date of Birth: 1945/10/11

## 2018-06-10 ENCOUNTER — Ambulatory Visit (HOSPITAL_COMMUNITY): Payer: Medicare Other

## 2018-06-10 DIAGNOSIS — R29898 Other symptoms and signs involving the musculoskeletal system: Secondary | ICD-10-CM | POA: Diagnosis not present

## 2018-06-10 DIAGNOSIS — M25551 Pain in right hip: Secondary | ICD-10-CM

## 2018-06-10 DIAGNOSIS — M6281 Muscle weakness (generalized): Secondary | ICD-10-CM

## 2018-06-10 NOTE — Therapy (Signed)
[place], [place], [address] Phone: [phone]   Fax:  [phone]  Physical Therapy Treatment  Patient Details  Name: Victoria Mcconnell MRN: [id number] Date of Birth: 1945/09/28 Referring Provider (PT): Delsa Grana, [place]   Encounter Date: 06/10/2018  PT End of Session - 06/10/18 1116    Visit Number  2    Number of Visits  13    Date for PT Re-Evaluation  07/19/18   mini reassess on 06/28/18   Authorization Type  [employer] Medicare    Authorization Time Period  06/07/18 to 07/19/18    Authorization - Visit Number  2    Authorization - Number of Visits  10    PT Start Time  6160    PT Stop Time  1200    PT Time Calculation (min)  43 min    Activity Tolerance  Patient tolerated treatment well;No increased pain    Behavior During Therapy  WFL for tasks assessed/performed       Past Medical History:  Diagnosis Date  . Allergy    seasonal allergies  . Anxiety   . Arthritis   . Diabetes mellitus   . Glaucoma   . Hyperlipidemia   . Hypertension   . Osteopenia     Past Surgical History:  Procedure Laterality Date  . ABDOMINAL HYSTERECTOMY      There were no vitals filed for this visit.  Subjective Assessment - 06/10/18 1116    Subjective  4/10 pain today in lateral right hip. HEP stretches have been helping.     Limitations  Walking;Standing;House hold activities;Sitting    How long can you sit comfortably?  unlimited now    How long can you stand comfortably?  unlimited now    How long can you walk comfortably?  unlimited now    Patient Stated Goals  get this pain out of her leg    Pain Onset  1 to 4 weeks ago         [facility] PT Assessment - 06/10/18 0001      Observation/Other Assessments   Focus on Therapeutic Outcomes (FOTO)   47% limited                   [facility] Adult PT Treatment/Exercise - 06/10/18 0001      Exercises   Exercises  Lumbar;Knee/Hip      Lumbar Exercises: Stretches    Piriformis Stretch  Right;3 reps;30 seconds    Figure 4 Stretch  3 reps;30 seconds;Supine;With overpressure      Lumbar Exercises: Supine   Ab Set  10 reps;3 seconds    Bridge  Non-compliant;10 reps;2 seconds      Lumbar Exercises: Sidelying   Clam  Right;10 reps    Clam Limitations  1 set      Knee/Hip Exercises: Stretches   Piriformis Stretch  --    Other Knee/Hip Stretches  --      Manual Therapy   Manual Therapy  Soft tissue mobilization    Manual therapy comments  performed separate from all other skilled interventions               PT Short Term Goals - 06/07/18 1248      PT SHORT TERM GOAL #1   Title  Pt will be independent with HEP and perform consistently in order to decrease pain and promote return to PLOF.     Time  3    Period  Weeks    Status  New    Target Date  06/28/18      PT SHORT TERM GOAL #2   Title  Pt will have improved R SLS to 15 sec or > in order demo improved core and hip strength.    Time  3    Period  Weeks    Status  New      PT SHORT TERM GOAL #3   Title  Pt will be able to perform 3MWT without increases in R buttock or lateral thigh pain to demo improved functional strength and maximize her ability to return to her regular walking program.    Time  3    Period  Weeks    Status  New        PT Long Term Goals - 06/07/18 1249      PT LONG TERM GOAL #1   Title  Pt will have 1 grade improvement in MMT throughout proximal hips in order to decrease pain and maximize her ability to physically assist her sister.     Time  6    Period  Weeks    Status  New    Target Date  07/19/18      PT LONG TERM GOAL #2   Title  Pt will be able to perform bil SLS for 30sec or > in order to further demo improved core and functional hip strength.    Time  6    Period  Weeks    Status  New      PT LONG TERM GOAL #3   Title  Pt will report decreased overall pain to 2/10 or better on a daily basis to demo improved overall function and maximize  ADL and IADL completion.     Time  6    Period  Weeks    Status  New      PT LONG TERM GOAL #4   Title  Pt will report being able to perform all physical assistance for her sister independently with proper form in order to demo improve core and functional strength to demo return to PLOF and decrease risk for reinjury.    Time  6    Period  Weeks    Status  New            Plan - 06/10/18 1212    Clinical Impression Statement  Reviewed eval/goals with patient. Completed FOTO with score of 47% perceived functional limitation. Reviewed HEP of piriformis stretch and figure 4 stretch. Added isometric abdominal and supine bridge to HEP for core and hip strengthening. Initiated therapeutic exercises for core and hip strengthening, including side-lying clams. Manual soft tissue mobilizations to right glute medius and tensor fascia latae recreated right hip and referred lateral thigh pain. Patient will need body mechanic education on log roll technique as patient exhibit supine to sit technique at end of session today. Patient would likely benefit from ITband stretch as well. Continue with current plan, progress as able, review HEP to ensure proper compliance and performance.     Rehab Potential  Good    PT Frequency  2x / week    PT Duration  6 weeks    PT Treatment/Interventions  ADLs/Self Care Home Management;Aquatic Therapy;Cryotherapy;Electrical Stimulation;Traction;Ultrasound;Gait training;Functional mobility training;Therapeutic activities;Stair training;Therapeutic exercise;Moist Heat;Balance training;Neuromuscular re-education;Patient/family education;Manual techniques;Passive range of motion;Dry needling;Energy conservation;Taping;Spinal Manipulations;Joint Manipulations    PT Next Visit Plan  review HEP; review and progress core, BLE, functional strenghtening, manual STM for restrictions  in R glute med and R VLO; assess lifting/transfer form later once strength and pain have improved,  consider ITband stretch for HEP.    PT Home Exercise Plan  eval: supine figure 4, supine piriformis stretch; 06/10/2018 - isometric abdominal, supine bridge    Consulted and Agree with Plan of Care  Patient       Patient will benefit from skilled therapeutic intervention in order to improve the following deficits and impairments:  Decreased activity tolerance, Decreased balance, Decreased range of motion, Decreased strength, Difficulty walking, Hypomobility, Increased fascial restricitons, Increased muscle spasms, Impaired flexibility, Improper body mechanics, Postural dysfunction, Pain  Visit Diagnosis: Pain in right hip  Muscle weakness (generalized)  Other symptoms and signs involving the musculoskeletal system     Problem List Patient Active Problem List   Diagnosis Date Noted  . Osteopenia 08/17/2017  . Constipation 07/07/2017  . GAD (generalized anxiety disorder) 07/07/2017  . Depression, major, single episode, moderate ([place]) 07/01/2015  . Ankle sprain 02/27/2015  . OA (osteoarthritis) 02/27/2015  . Hip bursitis 02/27/2015  . Essential hypertension, benign 01/16/2015  . Diabetes mellitus type 2, controlled ([place]) 01/16/2015  . Hyperlipidemia 01/16/2015  . Left shoulder pain 01/16/2015  . Obesity 01/16/2015  . Glaucoma 01/16/2015    Floria Raveling. Hartnett-Rands, MS, PT [facility] #[id number] 06/10/2018, 12:19 PM  [facility] [address] [place], [place], [address] Phone: [phone]   Fax:  [phone]  Name: Victoria Mcconnell MRN: [id number] Date of Birth: Sep 22, 1945

## 2018-06-10 NOTE — Patient Instructions (Signed)
Isometric Abdominal    Lying on back with knees bent, tighten stomach by pressing elbows down. Hold _5___ seconds. Repeat _10-15___ times per set. Do _1___ sets per session. Do _1___ sessions per day.  http://orth.exer.us/1087   Copyright  VHI. All rights reserved.   Flexors, Supine Bridge    Lie supine, feet shoulder-width apart. Lift hips toward ceiling. Hold 2-5___ seconds. Repeat 10-15___ times per session. Do _1__ sessions per day.  Copyright  VHI. All rights reserved.

## 2018-06-15 ENCOUNTER — Encounter (HOSPITAL_COMMUNITY): Payer: Self-pay

## 2018-06-15 ENCOUNTER — Ambulatory Visit (HOSPITAL_COMMUNITY): Payer: Medicare Other | Attending: Family Medicine

## 2018-06-15 DIAGNOSIS — M25551 Pain in right hip: Secondary | ICD-10-CM | POA: Diagnosis not present

## 2018-06-15 DIAGNOSIS — M6281 Muscle weakness (generalized): Secondary | ICD-10-CM | POA: Diagnosis not present

## 2018-06-15 DIAGNOSIS — R29898 Other symptoms and signs involving the musculoskeletal system: Secondary | ICD-10-CM | POA: Diagnosis not present

## 2018-06-15 NOTE — Therapy (Signed)
[place], [place], [address] Phone: [phone]   Fax:  [phone]  Physical Therapy Treatment  Patient Details  Name: Victoria Mcconnell MRN: [id number] Date of Birth: [birth year] Referring Provider (PT): Delsa Grana, [place]   Encounter Date: 06/15/2018  PT End of Session - 06/15/18 1144    Visit Number  3    Number of Visits  13    Date for PT Re-Evaluation  07/19/18   Minireassess 06/28/18   Authorization Type  [employer] Medicare    Authorization Time Period  06/07/18 to 07/19/18    Authorization - Visit Number  3    Authorization - Number of Visits  10    PT Start Time  3546    PT Stop Time  1203    PT Time Calculation (min)  41 min    Activity Tolerance  Patient tolerated treatment well;No increased pain    Behavior During Therapy  WFL for tasks assessed/performed       Past Medical History:  Diagnosis Date  . Allergy    seasonal allergies  . Anxiety   . Arthritis   . Diabetes mellitus   . Glaucoma   . Hyperlipidemia   . Hypertension   . Osteopenia     Past Surgical History:  Procedure Laterality Date  . ABDOMINAL HYSTERECTOMY      There were no vitals filed for this visit.  Subjective Assessment - 06/15/18 1143    Subjective  Pt stated she is feeling a whole lot better once she began therapy.  Reports compliance with newest HEP and no reports of pain today.      Patient Stated Goals  get this pain out of her leg    Currently in Pain?  No/denies                       [facility] Adult PT Treatment/Exercise - 06/15/18 0001      Bed Mobility   Bed Mobility  Sit to Sidelying Left;Left Sidelying to Sit    Left Sidelying to Sit  Supervision/Verbal cueing      Exercises   Exercises  Lumbar;Knee/Hip      Lumbar Exercises: Stretches   Single Knee to Chest Stretch  2 reps;30 seconds    ITB Stretch  3 reps;30 seconds    ITB Stretch Limitations  supine    Figure 4 Stretch  3 reps;30  seconds;Supine;With overpressure    Figure 4 Stretch Limitations  towel assistance      Lumbar Exercises: Supine   Ab Set  10 reps;3 seconds    AB Set Limitations  multimodal cueing for proper     Bridge  10 reps;2 seconds      Lumbar Exercises: Sidelying   Clam  10 reps;Both;5 seconds    Clam Limitations  RTB 5" holds      Manual Therapy   Manual Therapy  Soft tissue mobilization    Manual therapy comments  performed separate from all other skilled interventions    Soft tissue mobilization  STM to Rt glut med and VLO               PT Short Term Goals - 06/07/18 1248      PT SHORT TERM GOAL #1   Title  Pt will be independent with HEP and perform consistently in order to decrease pain and promote return to PLOF.     Time  3  Period  Weeks    Status  New    Target Date  06/28/18      PT SHORT TERM GOAL #2   Title  Pt will have improved R SLS to 15 sec or > in order demo improved core and hip strength.    Time  3    Period  Weeks    Status  New      PT SHORT TERM GOAL #3   Title  Pt will be able to perform 3MWT without increases in R buttock or lateral thigh pain to demo improved functional strength and maximize her ability to return to her regular walking program.    Time  3    Period  Weeks    Status  New        PT Long Term Goals - 06/07/18 1249      PT LONG TERM GOAL #1   Title  Pt will have 1 grade improvement in MMT throughout proximal hips in order to decrease pain and maximize her ability to physically assist her sister.     Time  6    Period  Weeks    Status  New    Target Date  07/19/18      PT LONG TERM GOAL #2   Title  Pt will be able to perform bil SLS for 30sec or > in order to further demo improved core and functional hip strength.    Time  6    Period  Weeks    Status  New      PT LONG TERM GOAL #3   Title  Pt will report decreased overall pain to 2/10 or better on a daily basis to demo improved overall function and maximize ADL and  IADL completion.     Time  6    Period  Weeks    Status  New      PT LONG TERM GOAL #4   Title  Pt will report being able to perform all physical assistance for her sister independently with proper form in order to demo improve core and functional strength to demo return to PLOF and decrease risk for reinjury.    Time  6    Period  Weeks    Status  New            Plan - 06/15/18 1730    Clinical Impression Statement  Session focus on improving core and hip strengthening as well as stretches to address hip mobility.  Pt required verbal and tactile cueing to activate appropriate core contraction.  Added ITB stretches and manual to Rt hip for pain control.  Pt reports positive results with ITB stretches, given HEP printout.      Rehab Potential  Good    PT Frequency  2x / week    PT Duration  6 weeks    PT Treatment/Interventions  ADLs/Self Care Home Management;Aquatic Therapy;Cryotherapy;Electrical Stimulation;Traction;Ultrasound;Gait training;Functional mobility training;Therapeutic activities;Stair training;Therapeutic exercise;Moist Heat;Balance training;Neuromuscular re-education;Patient/family education;Manual techniques;Passive range of motion;Dry needling;Energy conservation;Taping;Spinal Manipulations;Joint Manipulations    PT Next Visit Plan  review form and compliance with HEP; review and progress core, BLE, functional strenghtening, manual STM for restrictions in R glute med and R VLO; assess lifting/transfer form later once strength and pain have improved    PT Home Exercise Plan  eval: supine figure 4, supine piriformis stretch; 06/10/2018 - isometric abdominal, supine bridge 10/02: ITB stretch in supine       Patient will benefit from skilled  therapeutic intervention in order to improve the following deficits and impairments:  Decreased activity tolerance, Decreased balance, Decreased range of motion, Decreased strength, Difficulty walking, Hypomobility, Increased fascial  restricitons, Increased muscle spasms, Impaired flexibility, Improper body mechanics, Postural dysfunction, Pain  Visit Diagnosis: Pain in right hip  Muscle weakness (generalized)  Other symptoms and signs involving the musculoskeletal system     Problem List Patient Active Problem List   Diagnosis Date Noted  . Osteopenia 08/17/2017  . Constipation 07/07/2017  . GAD (generalized anxiety disorder) 07/07/2017  . Depression, major, single episode, moderate ([place]) 07/01/2015  . Ankle sprain 02/27/2015  . OA (osteoarthritis) 02/27/2015  . Hip bursitis 02/27/2015  . Essential hypertension, benign 01/16/2015  . Diabetes mellitus type 2, controlled ([place]) 01/16/2015  . Hyperlipidemia 01/16/2015  . Left shoulder pain 01/16/2015  . Obesity 01/16/2015  . Glaucoma 01/16/2015   Ihor Austin, [place]; [place]  Aldona Lento 06/15/2018, 5:39 PM  [facility] [address] [place], [place], [address] Phone: [phone]   Fax:  [phone]  Name: Victoria Mcconnell MRN: [id number] Date of Birth: Dec 27, 1945

## 2018-06-15 NOTE — Patient Instructions (Signed)
Outer Hip Stretch: Reclined IT Band Stretch (Strap)    Strap around opposite foot, pull across only as far as possible with shoulders on mat. Hold for 30 breaths. Repeat 3 times each leg.  Copyright  VHI. All rights reserved.

## 2018-06-21 ENCOUNTER — Encounter (HOSPITAL_COMMUNITY): Payer: Self-pay

## 2018-06-21 ENCOUNTER — Ambulatory Visit (HOSPITAL_COMMUNITY): Payer: Medicare Other

## 2018-06-21 DIAGNOSIS — M6281 Muscle weakness (generalized): Secondary | ICD-10-CM | POA: Diagnosis not present

## 2018-06-21 DIAGNOSIS — M25551 Pain in right hip: Secondary | ICD-10-CM

## 2018-06-21 DIAGNOSIS — R29898 Other symptoms and signs involving the musculoskeletal system: Secondary | ICD-10-CM | POA: Diagnosis not present

## 2018-06-21 NOTE — Therapy (Signed)
[place], [place], [address] Phone: [phone]   Fax:  [phone]  Physical Therapy Treatment  Patient Details  Name: Victoria Mcconnell MRN: [id number] Date of Birth: Nov 15, 1945 Referring Provider (PT): Delsa Grana, [place]   Encounter Date: 06/21/2018  PT End of Session - 06/21/18 1437    Visit Number  4    Number of Visits  13    Date for PT Re-Evaluation  07/19/18   Minireassess 06/28/2018   Authorization Type  [employer] Medicare    Authorization Time Period  06/07/18 to 07/19/18    Authorization - Visit Number  4    Authorization - Number of Visits  10    PT Start Time  4854    PT Stop Time  6270    PT Time Calculation (min)  43 min    Activity Tolerance  Patient tolerated treatment well;No increased pain    Behavior During Therapy  WFL for tasks assessed/performed       Past Medical History:  Diagnosis Date  . Allergy    seasonal allergies  . Anxiety   . Arthritis   . Diabetes mellitus   . Glaucoma   . Hyperlipidemia   . Hypertension   . Osteopenia     Past Surgical History:  Procedure Laterality Date  . ABDOMINAL HYSTERECTOMY      There were no vitals filed for this visit.  Subjective Assessment - 06/21/18 1436    Subjective  Pt stated she is feeling good today, no reports of pain currently.      Patient Stated Goals  get this pain out of her leg    Currently in Pain?  No/denies                       [facility] Adult PT Treatment/Exercise - 06/21/18 0001      Exercises   Exercises  Lumbar;Knee/Hip      Lumbar Exercises: Stretches   Active Hamstring Stretch  2 reps;30 seconds    Active Hamstring Stretch Limitations  hands behind knees    ITB Stretch  3 reps;30 seconds    ITB Stretch Limitations  supine    Figure 4 Stretch  3 reps;30 seconds;Supine;With overpressure    Figure 4 Stretch Limitations  towel assistance      Lumbar Exercises: Standing   Functional Squats  10 reps     Functional Squats Limitations  cueing for mechanics infront of mat      Lumbar Exercises: Supine   Ab Set  10 reps;3 seconds    AB Set Limitations  moderate cueing for proper form, improved activation    Heel Slides  10 reps    Heel Slides Limitations  feet ontop of theraball     Bent Knee Raise  10 reps;3 seconds    Bent Knee Raise Limitations  with ab set    Bridge  10 reps;2 seconds    Bridge with Ball Squeeze  10 reps;3 seconds   feet on ball     Lumbar Exercises: Sidelying   Clam  10 reps;Both;5 seconds    Clam Limitations  RTB 5" holds      Manual Therapy   Manual Therapy  Soft tissue mobilization    Manual therapy comments  performed separate from all other skilled interventions    Soft tissue mobilization  STM to Rt glut med in Lt sidelying with pillow between knees  PT Short Term Goals - 06/07/18 1248      PT SHORT TERM GOAL #1   Title  Pt will be independent with HEP and perform consistently in order to decrease pain and promote return to PLOF.     Time  3    Period  Weeks    Status  New    Target Date  06/28/18      PT SHORT TERM GOAL #2   Title  Pt will have improved R SLS to 15 sec or > in order demo improved core and hip strength.    Time  3    Period  Weeks    Status  New      PT SHORT TERM GOAL #3   Title  Pt will be able to perform 3MWT without increases in R buttock or lateral thigh pain to demo improved functional strength and maximize her ability to return to her regular walking program.    Time  3    Period  Weeks    Status  New        PT Long Term Goals - 06/07/18 1249      PT LONG TERM GOAL #1   Title  Pt will have 1 grade improvement in MMT throughout proximal hips in order to decrease pain and maximize her ability to physically assist her sister.     Time  6    Period  Weeks    Status  New    Target Date  07/19/18      PT LONG TERM GOAL #2   Title  Pt will be able to perform bil SLS for 30sec or > in order to  further demo improved core and functional hip strength.    Time  6    Period  Weeks    Status  New      PT LONG TERM GOAL #3   Title  Pt will report decreased overall pain to 2/10 or better on a daily basis to demo improved overall function and maximize ADL and IADL completion.     Time  6    Period  Weeks    Status  New      PT LONG TERM GOAL #4   Title  Pt will report being able to perform all physical assistance for her sister independently with proper form in order to demo improve core and functional strength to demo return to PLOF and decrease risk for reinjury.    Time  6    Period  Weeks    Status  New            Plan - 06/21/18 1452    Clinical Impression Statement  Continued session foucs on improving core and hip strengthening and reviewed form with stretches at home.  Pt reports she has lost printout for last session stretches (ITB), reviewed form and another printout given to pt. to assure proper form at home.  Added core stabilizing exercises with min cueing to assure proper ab contraction wiht activity and began squats wtih multimodal cueing to improve mechanics.  EOS with manual to address soft tissue restricitons in Rt glut med with reports of relief following.      Rehab Potential  Good    PT Frequency  2x / week    PT Duration  6 weeks    PT Treatment/Interventions  ADLs/Self Care Home Management;Aquatic Therapy;Cryotherapy;Electrical Stimulation;Traction;Ultrasound;Gait training;Functional mobility training;Therapeutic activities;Stair training;Therapeutic exercise;Moist Heat;Balance training;Neuromuscular re-education;Patient/family education;Manual techniques;Passive range of motion;Dry  needling;Energy conservation;Taping;Spinal Manipulations;Joint Manipulations    PT Next Visit Plan  Add supine LTR and review compliance with newest ITB stretch.  review and progress core, BLE, functional strenghtening, manual STM for restrictions in R glute med and R VLO; assess  lifting/transfer form later once strength and pain have improved    PT Home Exercise Plan  eval: supine figure 4, supine piriformis stretch; 06/10/2018 - isometric abdominal, supine bridge 10/02: ITB stretch in supine       Patient will benefit from skilled therapeutic intervention in order to improve the following deficits and impairments:  Decreased activity tolerance, Decreased balance, Decreased range of motion, Decreased strength, Difficulty walking, Hypomobility, Increased fascial restricitons, Increased muscle spasms, Impaired flexibility, Improper body mechanics, Postural dysfunction, Pain  Visit Diagnosis: Pain in right hip  Muscle weakness (generalized)  Other symptoms and signs involving the musculoskeletal system     Problem List Patient Active Problem List   Diagnosis Date Noted  . Osteopenia 08/17/2017  . Constipation 07/07/2017  . GAD (generalized anxiety disorder) 07/07/2017  . Depression, major, single episode, moderate ([place]) 07/01/2015  . Ankle sprain 02/27/2015  . OA (osteoarthritis) 02/27/2015  . Hip bursitis 02/27/2015  . Essential hypertension, benign 01/16/2015  . Diabetes mellitus type 2, controlled ([place]) 01/16/2015  . Hyperlipidemia 01/16/2015  . Left shoulder pain 01/16/2015  . Obesity 01/16/2015  . Glaucoma 01/16/2015   Ihor Austin, [place]; [place]  Aldona Lento 06/21/2018, 3:24 PM  [facility] [address] [place], [place], [address] Phone: [phone]   Fax:  [phone]  Name: Victoria Mcconnell MRN: [id number] Date of Birth: 18-Jun-1946

## 2018-06-23 ENCOUNTER — Encounter (HOSPITAL_COMMUNITY): Payer: Self-pay

## 2018-06-23 ENCOUNTER — Ambulatory Visit (HOSPITAL_COMMUNITY): Payer: Medicare Other

## 2018-06-23 DIAGNOSIS — M6281 Muscle weakness (generalized): Secondary | ICD-10-CM | POA: Diagnosis not present

## 2018-06-23 DIAGNOSIS — M25551 Pain in right hip: Secondary | ICD-10-CM

## 2018-06-23 DIAGNOSIS — R29898 Other symptoms and signs involving the musculoskeletal system: Secondary | ICD-10-CM | POA: Diagnosis not present

## 2018-06-23 NOTE — Therapy (Signed)
[place], [place], [address] Phone: [phone]   Fax:  [phone]  Physical Therapy Treatment  Patient Details  Name: Victoria Mcconnell MRN: [id number] Date of Birth: 15-Jul-1946 Referring Provider (PT): Delsa Grana, [place]   Encounter Date: 06/23/2018  PT End of Session - 06/23/18 1041    Visit Number  5    Number of Visits  13    Date for PT Re-Evaluation  07/19/18   MInireassess 06/28/18   Authorization Type  [employer] Medicare    Authorization Time Period  06/07/18 to 07/19/18    Authorization - Visit Number  5    Authorization - Number of Visits  10    PT Start Time  6063    PT Stop Time  1113    PT Time Calculation (min)  38 min    Activity Tolerance  Patient tolerated treatment well;No increased pain    Behavior During Therapy  WFL for tasks assessed/performed       Past Medical History:  Diagnosis Date  . Allergy    seasonal allergies  . Anxiety   . Arthritis   . Diabetes mellitus   . Glaucoma   . Hyperlipidemia   . Hypertension   . Osteopenia     Past Surgical History:  Procedure Laterality Date  . ABDOMINAL HYSTERECTOMY      There were no vitals filed for this visit.  Subjective Assessment - 06/23/18 1040    Subjective  Pt stated she feels like it's working, reports less stiffness and no pain today.      Patient Stated Goals  get this pain out of her leg    Currently in Pain?  No/denies                       [facility] Adult PT Treatment/Exercise - 06/23/18 0001      Exercises   Exercises  Lumbar;Knee/Hip      Lumbar Exercises: Stretches   Lower Trunk Rotation  5 reps;10 seconds    Lower Trunk Rotation Limitations  with ab set    Figure 4 Stretch  2 reps;30 seconds;Supine    Figure 4 Stretch Limitations  towel assistance      Lumbar Exercises: Standing   Functional Squats  10 reps    Functional Squats Limitations  cueing for mechanics infront of mat    Other Standing  Lumbar Exercises  hip abd and extension 10x with ab set    Other Standing Lumbar Exercises  SLS Rt 50", Lt 51" max of 3      Lumbar Exercises: Supine   Pelvic Tilt  10 reps;5 seconds    Pelvic Tilt Limitations  posterior tilt    Dead Bug  10 reps;3 seconds    Dead Bug Limitations  with ab set    Bridge  10 reps   2 sets     Manual Therapy   Manual Therapy  Soft tissue mobilization    Manual therapy comments  performed separate from all other skilled interventions    Soft tissue mobilization  STM to Rt glut med in Lt sidelying with pillow between knees               PT Short Term Goals - 06/07/18 1248      PT SHORT TERM GOAL #1   Title  Pt will be independent with HEP and perform consistently in order to decrease pain and promote return to  PLOF.     Time  3    Period  Weeks    Status  New    Target Date  06/28/18      PT SHORT TERM GOAL #2   Title  Pt will have improved R SLS to 15 sec or > in order demo improved core and hip strength.    Time  3    Period  Weeks    Status  New      PT SHORT TERM GOAL #3   Title  Pt will be able to perform 3MWT without increases in R buttock or lateral thigh pain to demo improved functional strength and maximize her ability to return to her regular walking program.    Time  3    Period  Weeks    Status  New        PT Long Term Goals - 06/07/18 1249      PT LONG TERM GOAL #1   Title  Pt will have 1 grade improvement in MMT throughout proximal hips in order to decrease pain and maximize her ability to physically assist her sister.     Time  6    Period  Weeks    Status  New    Target Date  07/19/18      PT LONG TERM GOAL #2   Title  Pt will be able to perform bil SLS for 30sec or > in order to further demo improved core and functional hip strength.    Time  6    Period  Weeks    Status  New      PT LONG TERM GOAL #3   Title  Pt will report decreased overall pain to 2/10 or better on a daily basis to demo improved overall  function and maximize ADL and IADL completion.     Time  6    Period  Weeks    Status  New      PT LONG TERM GOAL #4   Title  Pt will report being able to perform all physical assistance for her sister independently with proper form in order to demo improve core and functional strength to demo return to PLOF and decrease risk for reinjury.    Time  6    Period  Weeks    Status  New            Plan - 06/23/18 1111    Clinical Impression Statement  Continued session focus wiht core and hip strengthening and additional LTR to address mobility limitations.  Pt progressing well with reports of compliance with HEP, able to verbalize confidently form wiht newest ITB stretches.  Progressed this session to [facility] for hip strengthening and balance training.  Pt able to complete all new exercises with min cueing to improve core activation with SLS and form with hip ext/abd.  EOS with manual to address soft tissue restricitons wiht minimal tightness noted.  No reports of pain through session.      Rehab Potential  Good    PT Frequency  2x / week    PT Duration  6 weeks    PT Treatment/Interventions  ADLs/Self Care Home Management;Aquatic Therapy;Cryotherapy;Electrical Stimulation;Traction;Ultrasound;Gait training;Functional mobility training;Therapeutic activities;Stair training;Therapeutic exercise;Moist Heat;Balance training;Neuromuscular re-education;Patient/family education;Manual techniques;Passive range of motion;Dry needling;Energy conservation;Taping;Spinal Manipulations;Joint Manipulations    PT Next Visit Plan  Begin forward lunges, marching with ab set and vector stance next session.  Continue education on proper form with squats.  Manual STM  for restricitons in [place].  Assess lifting/transfer form later once strength and pain have improved.      PT Home Exercise Plan  eval: supine figure 4, supine piriformis stretch; 06/10/2018 - isometric abdominal, supine bridge 10/02: ITB stretch in  supine       Patient will benefit from skilled therapeutic intervention in order to improve the following deficits and impairments:  Decreased activity tolerance, Decreased balance, Decreased range of motion, Decreased strength, Difficulty walking, Hypomobility, Increased fascial restricitons, Increased muscle spasms, Impaired flexibility, Improper body mechanics, Postural dysfunction, Pain  Visit Diagnosis: Pain in right hip  Muscle weakness (generalized)  Other symptoms and signs involving the musculoskeletal system     Problem List Patient Active Problem List   Diagnosis Date Noted  . Osteopenia 08/17/2017  . Constipation 07/07/2017  . GAD (generalized anxiety disorder) 07/07/2017  . Depression, major, single episode, moderate ([place]) 07/01/2015  . Ankle sprain 02/27/2015  . OA (osteoarthritis) 02/27/2015  . Hip bursitis 02/27/2015  . Essential hypertension, benign 01/16/2015  . Diabetes mellitus type 2, controlled ([place]) 01/16/2015  . Hyperlipidemia 01/16/2015  . Left shoulder pain 01/16/2015  . Obesity 01/16/2015  . Glaucoma 01/16/2015   Ihor Austin, [place]; [place]  Aldona Lento 06/23/2018, [place] [address] [place], [place], [address] Phone: [phone]   Fax:  [phone]  Name: Victoria Mcconnell MRN: [id number] Date of Birth: 1945/12/23

## 2018-06-27 ENCOUNTER — Ambulatory Visit (HOSPITAL_COMMUNITY): Payer: Medicare Other

## 2018-06-27 ENCOUNTER — Encounter (HOSPITAL_COMMUNITY): Payer: Self-pay

## 2018-06-27 DIAGNOSIS — M6281 Muscle weakness (generalized): Secondary | ICD-10-CM | POA: Diagnosis not present

## 2018-06-27 DIAGNOSIS — R29898 Other symptoms and signs involving the musculoskeletal system: Secondary | ICD-10-CM

## 2018-06-27 DIAGNOSIS — M25551 Pain in right hip: Secondary | ICD-10-CM

## 2018-06-27 NOTE — Therapy (Signed)
[place], [place], [address] Phone: [phone]   Fax:  [phone]  Physical Therapy Treatment  Patient Details  Name: Victoria Mcconnell MRN: [id number] Date of Birth: 1946-04-14 Referring Provider (PT): Delsa Grana, PA-C   Encounter Date: 06/27/2018  PT End of Session - 06/27/18 1032    Visit Number  6    Number of Visits  13    Date for PT Re-Evaluation  07/19/18   MInireassess completed 06/27/18   Authorization Type  [employer] Medicare    Authorization Time Period  06/07/18 to 07/19/18    Authorization - Visit Number  6    Authorization - Number of Visits  13    PT Start Time  5027    Activity Tolerance  Patient tolerated treatment well;No increased pain    Behavior During Therapy  WFL for tasks assessed/performed       Past Medical History:  Diagnosis Date  . Allergy    seasonal allergies  . Anxiety   . Arthritis   . Diabetes mellitus   . Glaucoma   . Hyperlipidemia   . Hypertension   . Osteopenia     Past Surgical History:  Procedure Laterality Date  . ABDOMINAL HYSTERECTOMY      There were no vitals filed for this visit.  Subjective Assessment - 06/27/18 1034    Subjective  Pt states that she feels better overall. She has not had any pain since her treatment session last Thursday. She states that her pain is not keeping her from nearly as much as it was at the beginning of therapy.     Patient Stated Goals  get this pain out of her leg    Currently in Pain?  No/denies         [facility] PT Assessment - 06/27/18 0001      Assessment   Medical Diagnosis  acute R sided LBP with R sided sciatica    Referring Provider (PT)  Delsa Grana, PA-C    Onset Date/Surgical Date  05/14/18    Next MD Visit  08/2018    Prior Therapy  yes for same thing about 10 years ago      Observation/Other Assessments   Focus on Therapeutic Outcomes (FOTO)   17% limited   was 47% limited     Strength   Right Hip  Flexion  4+/5   was 4   Right Hip Extension  4/5   was 3   Right Hip ABduction  4+/5   was 4   Left Hip Extension  4/5   was 3+   Left Hip ABduction  4/5   was 4     Ambulation/Gait   Ambulation Distance (Feet)  842 Feet   3MWT; was 78f with increased pain   Assistive device  None    Gait Pattern  Within Functional Limits;Step-through pattern    Gait Comments  no increased pain during testing      Balance   Balance Assessed  Yes      Static Standing Balance   Static Standing - Balance Support  No upper extremity supported    Static Standing Balance -  Activities   Single Leg Stance - Right Leg;Single Leg Stance - Left Leg    Static Standing - Comment/# of Minutes  R: 21sec or <, increased trunk lean over RLE but improved; L: 15sec < min to no turnk lean   was 7.6sec or < on RLE  and 26.8sec on LLE           [facility] Adult PT Treatment/Exercise - 06/27/18 0001      Therapeutic Activites    Therapeutic Activities  Lifting;Other Therapeutic Activities    Lifting  stand pivot transfers to simulate assisting sister at home x10 mins             PT Short Term Goals - 06/27/18 1035      PT SHORT TERM GOAL #1   Title  Pt will be independent with HEP and perform consistently in order to decrease pain and promote return to PLOF.     Time  3    Period  Weeks    Status  Achieved      PT SHORT TERM GOAL #2   Title  Pt will have improved R SLS to 15 sec or > in order demo improved core and hip strength.    Baseline  10/14: 21sec on RLE    Time  3    Period  Weeks    Status  Achieved      PT SHORT TERM GOAL #3   Title  Pt will be able to perform 3MWT without increases in R buttock or lateral thigh pain to demo improved functional strength and maximize her ability to return to her regular walking program.    Baseline  10/14: increased by 15f and no increased pain in buttock or lateral thigh; has not started her regular walking program    Time  3    Period  Weeks     Status  Partially Met        PT Long Term Goals - 06/27/18 1036      PT LONG TERM GOAL #1   Title  Pt will have 1 grade improvement in MMT throughout proximal hips in order to decrease pain and maximize her ability to physically assist her sister.     Baseline  10/14: see MMT    Time  6    Period  Weeks    Status  Partially Met      PT LONG TERM GOAL #2   Title  Pt will be able to perform bil SLS for 30sec or > in order to further demo improved core and functional hip strength.    Baseline  10/14: R: 21sec, L: 15sec    Time  6    Period  Weeks    Status  On-going      PT LONG TERM GOAL #3   Title  Pt will report decreased overall pain to 2/10 or better on a daily basis to demo improved overall function and maximize ADL and IADL completion.     Baseline  10/14: has not had any pain in last 4 days but over last 2 weeks she had some pain    Time  6    Period  Weeks    Status  Partially Met      PT LONG TERM GOAL #4   Title  Pt will report being able to perform all physical assistance for her sister independently with proper form in order to demo improve core and functional strength to demo return to PLOF and decrease risk for reinjury.    Baseline  10/14: "getting there;" still has the most difficulty with assisting pt with STS but has been doing it independently for the last 2 weeks, no pain when assisting just fatigue    Time  6    Period  Weeks    Status  Partially Met            Plan - 06/27/18 1115    Clinical Impression Statement  PT reassessed pt's goals and outcome measures this date. Pt has made great progress overall as illustrated above. Her MMT has improved, SLS on RLE has improved, and her overall reports of pain have significantly improved. She is, however, still presenting with proximal hip and functional strength deficits, as well as deficits in core strength and overall balance as illustrated above. Pt needs continued skilled PT intervention to address  remaining deficits in order to continue to improve strength and decrease risk for reinjury. Rest of session focused on proper body mechanics when physically assisting her sister during STS and SPT. Pt verbalizing that she lifts her ~220# sister "like a baby" because she doesn't feel like she is strong enough to lift her from the front. PT explained and demo'd in detail proper body mechanics with lifting her sister from the front while using a knee block and gait belt and pt stated that she felt very comfortable with trying this with her sister and that she would go and purchase a gait belt today. Continue POC as planned, progressing as able.     Rehab Potential  Good    PT Frequency  2x / week    PT Duration  6 weeks    PT Treatment/Interventions  ADLs/Self Care Home Management;Aquatic Therapy;Cryotherapy;Electrical Stimulation;Traction;Ultrasound;Gait training;Functional mobility training;Therapeutic activities;Stair training;Therapeutic exercise;Moist Heat;Balance training;Neuromuscular re-education;Patient/family education;Manual techniques;Passive range of motion;Dry needling;Energy conservation;Taping;Spinal Manipulations;Joint Manipulations    PT Next Visit Plan  Begin forward lunges, marching with ab set and vector stance next session. Continue education on proper form with squats. Manual STM for restricitons in Rt glut med. Review STS/SPT lifting/body mechanics with pt and ensure performing correctly    PT Home Exercise Plan  eval: supine figure 4, supine piriformis stretch; 06/10/2018 - isometric abdominal, supine bridge 10/02: ITB stretch in supine; 10/14: clams GTB    Consulted and Agree with Plan of Care  Patient       Patient will benefit from skilled therapeutic intervention in order to improve the following deficits and impairments:  Decreased activity tolerance, Decreased balance, Decreased range of motion, Decreased strength, Difficulty walking, Hypomobility, Increased fascial  restricitons, Increased muscle spasms, Impaired flexibility, Improper body mechanics, Postural dysfunction, Pain  Visit Diagnosis: Pain in right hip  Muscle weakness (generalized)  Other symptoms and signs involving the musculoskeletal system     Problem List Patient Active Problem List   Diagnosis Date Noted  . Osteopenia 08/17/2017  . Constipation 07/07/2017  . GAD (generalized anxiety disorder) 07/07/2017  . Depression, major, single episode, moderate ([place]) 07/01/2015  . Ankle sprain 02/27/2015  . OA (osteoarthritis) 02/27/2015  . Hip bursitis 02/27/2015  . Essential hypertension, benign 01/16/2015  . Diabetes mellitus type 2, controlled ([place]) 01/16/2015  . Hyperlipidemia 01/16/2015  . Left shoulder pain 01/16/2015  . Obesity 01/16/2015  . Glaucoma 01/16/2015       Geraldine Solar PT, DPT  [facility] [address] [place], [place], [address] Phone: [phone]   Fax:  [phone]  Name: Victoria Mcconnell MRN: [id number] Date of Birth: [birth year]

## 2018-06-29 ENCOUNTER — Encounter (HOSPITAL_COMMUNITY): Payer: Self-pay

## 2018-06-29 ENCOUNTER — Ambulatory Visit (HOSPITAL_COMMUNITY): Payer: Medicare Other

## 2018-06-29 DIAGNOSIS — M25551 Pain in right hip: Secondary | ICD-10-CM | POA: Diagnosis not present

## 2018-06-29 DIAGNOSIS — M6281 Muscle weakness (generalized): Secondary | ICD-10-CM | POA: Diagnosis not present

## 2018-06-29 DIAGNOSIS — R29898 Other symptoms and signs involving the musculoskeletal system: Secondary | ICD-10-CM

## 2018-06-29 NOTE — Therapy (Signed)
[place], [place], [address] Phone: [phone]   Fax:  [phone]  Physical Therapy Treatment  Patient Details  Name: Victoria Mcconnell MRN: [id number] Date of Birth: 08/18/1946 Referring Provider (PT): Delsa Grana, [place]   Encounter Date: 06/29/2018  PT End of Session - 06/29/18 1031    Visit Number  7    Number of Visits  13    Date for PT Re-Evaluation  07/19/18   MInireassess completed 06/27/18   Authorization Type  [employer] Medicare    Authorization Time Period  06/07/18 to 07/19/18    Authorization - Visit Number  7    Authorization - Number of Visits  13    PT Start Time  8264    PT Stop Time  1110    PT Time Calculation (min)  40 min    Activity Tolerance  Patient tolerated treatment well;No increased pain    Behavior During Therapy  WFL for tasks assessed/performed       Past Medical History:  Diagnosis Date  . Allergy    seasonal allergies  . Anxiety   . Arthritis   . Diabetes mellitus   . Glaucoma   . Hyperlipidemia   . Hypertension   . Osteopenia     Past Surgical History:  Procedure Laterality Date  . ABDOMINAL HYSTERECTOMY      There were no vitals filed for this visit.  Subjective Assessment - 06/29/18 1031    Subjective  Pt reports that she ordered a gait belt from [place] to assist with transferring her sister. No pain this morning, and no pain going on for almost a week now.     Patient Stated Goals  get this pain out of her leg    Currently in Pain?  No/denies             [facility] Adult PT Treatment/Exercise - 06/29/18 0001      Exercises   Exercises  Lumbar;Knee/Hip      Lumbar Exercises: Stretches   Single Knee to Chest Stretch  Right;Left;3 reps;20 seconds    Piriformis Stretch  Right;Left;3 reps;30 seconds      Lumbar Exercises: Aerobic   UBE (Upper Arm Bike)  x3 mins retro, L2 for postural strengthening      Lumbar Exercises: Standing   Functional Squats  10 reps     Functional Squats Limitations  2 sets, min cues for form    Forward Lunge  10 reps    Forward Lunge Limitations  BLE, 2sets, 1 UE support    Scapular Retraction  Both;10 reps    Theraband Level (Scapular Retraction)  Level 3 (Green)    Scapular Retraction Limitations  2 sets    Shoulder Extension  Both;10 reps    Theraband Level (Shoulder Extension)  Level 3 (Green)    Shoulder Extension Limitations  2 sets    Shoulder Adduction Limitations  bil RDLs wiht 5# weight bar x15 reps; deadlifts from 8" step x15 reps 10#    Other Standing Lumbar Exercises  sidestepping GTB 49f x2RT    Other Standing Lumbar Exercises  SLS vectors firm x5RT BLE      Lumbar Exercises: Quadruped   Opposite Arm/Leg Raise  Right arm/Left leg;Left arm/Right leg;10 reps;5 seconds    Other Quadruped Lumbar Exercises  firehydrants x10 reps BLE            PT Education - 06/29/18 1109    Education Details  exercise  technique, continue HEP, expect DOMS from today    Person(s) Educated  Patient    Methods  Explanation;Demonstration    Comprehension  Verbalized understanding;Returned demonstration       PT Short Term Goals - 06/27/18 1035      PT SHORT TERM GOAL #1   Title  Pt will be independent with HEP and perform consistently in order to decrease pain and promote return to PLOF.     Time  3    Period  Weeks    Status  Achieved      PT SHORT TERM GOAL #2   Title  Pt will have improved R SLS to 15 sec or > in order demo improved core and hip strength.    Baseline  10/14: 21sec on RLE    Time  3    Period  Weeks    Status  Achieved      PT SHORT TERM GOAL #3   Title  Pt will be able to perform 3MWT without increases in R buttock or lateral thigh pain to demo improved functional strength and maximize her ability to return to her regular walking program.    Baseline  10/14: increased by 62f and no increased pain in buttock or lateral thigh; has not started her regular walking program    Time  3     Period  Weeks    Status  Partially Met        PT Long Term Goals - 06/27/18 1036      PT LONG TERM GOAL #1   Title  Pt will have 1 grade improvement in MMT throughout proximal hips in order to decrease pain and maximize her ability to physically assist her sister.     Baseline  10/14: see MMT    Time  6    Period  Weeks    Status  Partially Met      PT LONG TERM GOAL #2   Title  Pt will be able to perform bil SLS for 30sec or > in order to further demo improved core and functional hip strength.    Baseline  10/14: R: 21sec, L: 15sec    Time  6    Period  Weeks    Status  On-going      PT LONG TERM GOAL #3   Title  Pt will report decreased overall pain to 2/10 or better on a daily basis to demo improved overall function and maximize ADL and IADL completion.     Baseline  10/14: has not had any pain in last 4 days but over last 2 weeks she had some pain    Time  6    Period  Weeks    Status  Partially Met      PT LONG TERM GOAL #4   Title  Pt will report being able to perform all physical assistance for her sister independently with proper form in order to demo improve core and functional strength to demo return to PLOF and decrease risk for reinjury.    Baseline  10/14: "getting there;" still has the most difficulty with assisting pt with STS but has been doing it independently for the last 2 weeks, no pain when assisting just fatigue    Time  6    Period  Weeks    Status  Partially Met            Plan - 06/29/18 1111    Clinical Impression Statement  Began  session on UBE and added postural theraband for postural, core and back strengthening. Continued with squats and added RDLs and deadlifts from elevated surface for posterior chain strengthening and to minimize pain with lifting; min cues for form for squats and min-mod cues for RDLs but able to demo understanding of cues. Added sidestepping GTB and birddogs for core strengthening. PT assessed for any tenderness or mm  restrictions in lumbar paraspinals and R glutes and pt denied any pain to palpation or recreation of pain and mild to no restrictions noted so did not perform manual this date. Ended with hip stretching to facilitate mm recovery from progressed strengthening this date.  No pain reported at EOS, just fatigue. PT provided extensive education on DOMS and to expect mm soreness following today's session and she verbalized understanding.    Rehab Potential  Good    PT Frequency  2x / week    PT Duration  6 weeks    PT Treatment/Interventions  ADLs/Self Care Home Management;Aquatic Therapy;Cryotherapy;Electrical Stimulation;Traction;Ultrasound;Gait training;Functional mobility training;Therapeutic activities;Stair training;Therapeutic exercise;Moist Heat;Balance training;Neuromuscular re-education;Patient/family education;Manual techniques;Passive range of motion;Dry needling;Energy conservation;Taping;Spinal Manipulations;Joint Manipulations    PT Next Visit Plan  continue functional and hip strength, deadlifts, squats, etc. Manual STM for restricitons in Rt glut med PRN; Review STS/SPT lifting/body mechanics PRN    PT Home Exercise Plan  eval: supine figure 4, supine piriformis stretch; 06/10/2018 - isometric abdominal, supine bridge 10/02: ITB stretch in supine; 10/14: clams GTB    Consulted and Agree with Plan of Care  Patient       Patient will benefit from skilled therapeutic intervention in order to improve the following deficits and impairments:  Decreased activity tolerance, Decreased balance, Decreased range of motion, Decreased strength, Difficulty walking, Hypomobility, Increased fascial restricitons, Increased muscle spasms, Impaired flexibility, Improper body mechanics, Postural dysfunction, Pain  Visit Diagnosis: Pain in right hip  Muscle weakness (generalized)  Other symptoms and signs involving the musculoskeletal system     Problem List Patient Active Problem List   Diagnosis Date  Noted  . Osteopenia 08/17/2017  . Constipation 07/07/2017  . GAD (generalized anxiety disorder) 07/07/2017  . Depression, major, single episode, moderate ([place]) 07/01/2015  . Ankle sprain 02/27/2015  . OA (osteoarthritis) 02/27/2015  . Hip bursitis 02/27/2015  . Essential hypertension, benign 01/16/2015  . Diabetes mellitus type 2, controlled ([place]) 01/16/2015  . Hyperlipidemia 01/16/2015  . Left shoulder pain 01/16/2015  . Obesity 01/16/2015  . Glaucoma 01/16/2015        Geraldine Solar PT, DPT   [facility] [address] [place], [place], [address] Phone: [phone]   Fax:  [phone]  Name: Victoria Mcconnell MRN: [id number] Date of Birth: [birth year]

## 2018-07-04 ENCOUNTER — Encounter (HOSPITAL_COMMUNITY): Payer: Self-pay

## 2018-07-04 ENCOUNTER — Ambulatory Visit (HOSPITAL_COMMUNITY): Payer: Medicare Other

## 2018-07-04 DIAGNOSIS — M25551 Pain in right hip: Secondary | ICD-10-CM

## 2018-07-04 DIAGNOSIS — R29898 Other symptoms and signs involving the musculoskeletal system: Secondary | ICD-10-CM | POA: Diagnosis not present

## 2018-07-04 DIAGNOSIS — M6281 Muscle weakness (generalized): Secondary | ICD-10-CM | POA: Diagnosis not present

## 2018-07-04 NOTE — Therapy (Signed)
[name], [place], [address] Phone: [phone]   Fax:  [phone]  Physical Therapy Treatment  Patient Details  Name: Victoria Mcconnell MRN: [id number] Date of Birth: [birth year] Referring Provider (PT): Victoria Mcconnell, [place]   Encounter Date: 07/04/2018  PT End of Session - 07/04/18 1116    Visit Number  8    Number of Visits  13    Date for PT Re-Evaluation  07/19/18   MInireassess completed 06/27/18   Authorization Type  [employer] Medicare    Authorization Time Period  06/07/18 to 07/19/18    Authorization - Visit Number  8    Authorization - Number of Visits  13    PT Start Time  8101    PT Stop Time  1155    PT Time Calculation (min)  40 min    Activity Tolerance  Patient tolerated treatment well;No increased pain    Behavior During Therapy  WFL for tasks assessed/performed       Past Medical History:  Diagnosis Date  . Allergy    seasonal allergies  . Anxiety   . Arthritis   . Diabetes mellitus   . Glaucoma   . Hyperlipidemia   . Hypertension   . Osteopenia     Past Surgical History:  Procedure Laterality Date  . ABDOMINAL HYSTERECTOMY      There were no vitals filed for this visit.  Subjective Assessment - 07/04/18 1117    Subjective  Pt reports that she was sore folloiwng her last session but no pain. Her gait belt came in and she has been doing the SPT with her sister and states it is going well. She feels comfortable with it but feels she will only get better the more she does it.     Patient Stated Goals  get this pain out of her leg    Currently in Pain?  No/denies             [facility] Adult PT Treatment/Exercise - 07/04/18 0001      Exercises   Exercises  Lumbar;Knee/Hip      Lumbar Exercises: Aerobic   UBE (Upper Arm Bike)  x3 mins retro, L2 for postural strengthening      Lumbar Exercises: Standing   Lifting Limitations  uni farmers carries x2 laps each UE holding 5#    Side Lunge  Limitations  bent over single arm rows 5# x20reps each; bent over t's bil 2# DB 2x10reps    Scapular Retraction  Both;15 reps    Theraband Level (Scapular Retraction)  Level 3 (Green)    Scapular Retraction Limitations  2 sets    Shoulder Extension  Both;15 reps    Theraband Level (Shoulder Extension)  Level 3 (Green)    Shoulder Extension Limitations  2 sets    Shoulder Adduction Limitations  bil RDLs with 5# wt bar 2x15 reps, min cues for form; deadlifts from 8" step 10# x15 reps    Other Standing Lumbar Exercises  sidestepping GTB 81f x2RT;    Other Standing Lumbar Exercises  SLS foam 5x BLE (max of 16" L, 8" R)      Lumbar Exercises: Quadruped   Opposite Arm/Leg Raise  Right arm/Left leg;Left arm/Right leg;10 reps;3 seconds    Other Quadruped Lumbar Exercises  firehydrants RTB x10 reps BLE    Other Quadruped Lumbar Exercises  bil hip ext RTB x10 reps each  PT Education - 07/04/18 1117    Education Details  exercise technique, continue HEP    Person(s) Educated  Patient    Methods  Explanation;Demonstration    Comprehension  Verbalized understanding;Returned demonstration       PT Short Term Goals - 06/27/18 1035      PT SHORT TERM GOAL #1   Title  Pt will be independent with HEP and perform consistently in order to decrease pain and promote return to PLOF.     Time  3    Period  Weeks    Status  Achieved      PT SHORT TERM GOAL #2   Title  Pt will have improved R SLS to 15 sec or > in order demo improved core and hip strength.    Baseline  10/14: 21sec on RLE    Time  3    Period  Weeks    Status  Achieved      PT SHORT TERM GOAL #3   Title  Pt will be able to perform 3MWT without increases in R buttock or lateral thigh pain to demo improved functional strength and maximize her ability to return to her regular walking program.    Baseline  10/14: increased by 67f and no increased pain in buttock or lateral thigh; has not started her regular walking  program    Time  3    Period  Weeks    Status  Partially Met        PT Long Term Goals - 06/27/18 1036      PT LONG TERM GOAL #1   Title  Pt will have 1 grade improvement in MMT throughout proximal hips in order to decrease pain and maximize her ability to physically assist her sister.     Baseline  10/14: see MMT    Time  6    Period  Weeks    Status  Partially Met      PT LONG TERM GOAL #2   Title  Pt will be able to perform bil SLS for 30sec or > in order to further demo improved core and functional hip strength.    Baseline  10/14: R: 21sec, L: 15sec    Time  6    Period  Weeks    Status  On-going      PT LONG TERM GOAL #3   Title  Pt will report decreased overall pain to 2/10 or better on a daily basis to demo improved overall function and maximize ADL and IADL completion.     Baseline  10/14: has not had any pain in last 4 days but over last 2 weeks she had some pain    Time  6    Period  Weeks    Status  Partially Met      PT LONG TERM GOAL #4   Title  Pt will report being able to perform all physical assistance for her sister independently with proper form in order to demo improve core and functional strength to demo return to PLOF and decrease risk for reinjury.    Baseline  10/14: "getting there;" still has the most difficulty with assisting pt with STS but has been doing it independently for the last 2 weeks, no pain when assisting just fatigue    Time  6    Period  Weeks    Status  Partially Met            Plan - 07/04/18 1157  Clinical Impression Statement  Continued with established POC focusing on functional, BLE, and core strengthening. Continued with deadlifts and RDLs to maximize her ability to lift without LBP and progressed postural/back strengthening this date with bent over rows and bent over T's with dumbbells. Also added farmer's carries for core and anti-lateral flexion strength. Educated pt that she should expect some soreness/DOMS similar to  after last session and educated her that her back may be sore from the increased focus on strengthening it, but that it shouldn't be painful and she verbalized understanding. No pain at EOS, just mm fatigue.     Rehab Potential  Good    PT Frequency  2x / week    PT Duration  6 weeks    PT Treatment/Interventions  ADLs/Self Care Home Management;Aquatic Therapy;Cryotherapy;Electrical Stimulation;Traction;Ultrasound;Gait training;Functional mobility training;Therapeutic activities;Stair training;Therapeutic exercise;Moist Heat;Balance training;Neuromuscular re-education;Patient/family education;Manual techniques;Passive range of motion;Dry needling;Energy conservation;Taping;Spinal Manipulations;Joint Manipulations    PT Next Visit Plan  continue functional and hip strength, deadlifts, squats, etc; continue core strength; Manual STM for restricitons in Rt glut med PRN; Review STS/SPT lifting/body mechanics PRN    PT Home Exercise Plan  eval: supine figure 4, supine piriformis stretch; 06/10/2018 - isometric abdominal, supine bridge 10/02: ITB stretch in supine; 10/14: clams GTB    Consulted and Agree with Plan of Care  Patient       Patient will benefit from skilled therapeutic intervention in order to improve the following deficits and impairments:  Decreased activity tolerance, Decreased balance, Decreased range of motion, Decreased strength, Difficulty walking, Hypomobility, Increased fascial restricitons, Increased muscle spasms, Impaired flexibility, Improper body mechanics, Postural dysfunction, Pain  Visit Diagnosis: Pain in right hip  Muscle weakness (generalized)  Other symptoms and signs involving the musculoskeletal system     Problem List Patient Active Problem List   Diagnosis Date Noted  . Osteopenia 08/17/2017  . Constipation 07/07/2017  . GAD (generalized anxiety disorder) 07/07/2017  . Depression, major, single episode, moderate ([place]) 07/01/2015  . Ankle sprain 02/27/2015   . OA (osteoarthritis) 02/27/2015  . Hip bursitis 02/27/2015  . Essential hypertension, benign 01/16/2015  . Diabetes mellitus type 2, controlled ([place]) 01/16/2015  . Hyperlipidemia 01/16/2015  . Left shoulder pain 01/16/2015  . Obesity 01/16/2015  . Glaucoma 01/16/2015        Geraldine Solar PT, DPT  [place] [address] [place], [place], [address] Phone: [phone]   Fax:  [phone]  Name: Victoria Mcconnell MRN: [id number] Date of Birth: 11/22/1945

## 2018-07-06 ENCOUNTER — Ambulatory Visit (HOSPITAL_COMMUNITY): Payer: Medicare Other

## 2018-07-06 ENCOUNTER — Encounter (HOSPITAL_COMMUNITY): Payer: Self-pay

## 2018-07-06 DIAGNOSIS — M6281 Muscle weakness (generalized): Secondary | ICD-10-CM

## 2018-07-06 DIAGNOSIS — M25551 Pain in right hip: Secondary | ICD-10-CM

## 2018-07-06 DIAGNOSIS — R29898 Other symptoms and signs involving the musculoskeletal system: Secondary | ICD-10-CM

## 2018-07-06 NOTE — Patient Instructions (Signed)
Access Code: [email or web address]  URL: [email or web address]  Date: 07/06/2018  Prepared by: Geraldine Solar   Exercises Standing Shoulder Row with Anchored Resistance - 10 reps - 3 sets - 1x daily - 7x weekly Shoulder Extension with Resistance - 10 reps - 3 sets - 1x daily - 7x weekly Side Stepping with Resistance at Ankles - 10 reps - 3 sets - 1x daily - 7x weekly Hip Abduction with Resistance Loop - 10 reps - 3 sets - 1x daily - 7x weekly Diagonal Hip Extension with Resistance - 10 reps - 3 sets - 1x daily - 7x weekly Standing 3-Way Kick - 10 reps - 3 sets - 1x daily - 7x weekly Sidelying Hip Abduction - 10 reps - 3 sets - 1x daily - 7x weekly Bird Dog - 10 reps - 3 sets - 1x daily - 7x weekly Quadruped Hip Abduction with Resistance Loop - 10 reps - 3 sets                            - 1x daily - 7x weekly

## 2018-07-06 NOTE — Therapy (Signed)
[facility] [address] [place], [place], [address] Phone: [phone]   Fax:  [phone]   PHYSICAL THERAPY DISCHARGE SUMMARY  Visits from Start of Care: 9  Current functional level related to goals / functional outcomes: See below   Remaining deficits: See below   Education / Equipment: HEP  Plan: Patient agrees to discharge.  Patient goals were met. Patient is being discharged due to meeting the stated rehab goals.  ?????    Physical Therapy Treatment  Patient Details  Name: Victoria Mcconnell MRN: [id number] Date of Birth: [birth year] Referring Provider (PT): Delsa Grana, [place]   Encounter Date: 07/06/2018  PT End of Session - 07/06/18 1116    Visit Number  9    Number of Visits  13    Date for PT Re-Evaluation  07/19/18   MInireassess completed 06/27/18   Authorization Type  [employer]HC Medicare    Authorization Time Period  06/07/18 to 07/19/18    Authorization - Visit Number  9    Authorization - Number of Visits  13    PT Start Time  2831    PT Stop Time  1142    PT Time Calculation (min)  27 min    Activity Tolerance  Patient tolerated treatment well;No increased pain    Behavior During Therapy  WFL for tasks assessed/performed       Past Medical History:  Diagnosis Date  . Allergy    seasonal allergies  . Anxiety   . Arthritis   . Diabetes mellitus   . Glaucoma   . Hyperlipidemia   . Hypertension   . Osteopenia     Past Surgical History:  Procedure Laterality Date  . ABDOMINAL HYSTERECTOMY      There were no vitals filed for this visit.  Subjective Assessment - 07/06/18 1116    Subjective  Pt reports that she has not been in pain for about 3 weeks. She has only been getting sore from the exercises but no pain.    Patient Stated Goals  get this pain out of her leg    Currently in Pain?  No/denies         [facility] PT Assessment - 07/06/18 0001      Assessment   Medical Diagnosis  acute R sided LBP with R  sided sciatica    Referring Provider (PT)  Delsa Grana, PA-C    Onset Date/Surgical Date  05/14/18    Next MD Visit  08/2018    Prior Therapy  yes for same thing about 10 years ago      Strength   Right Hip Flexion  5/5   was 4+   Right Hip Extension  4/5   was 4   Right Hip ABduction  4+/5   was 4+   Left Hip Extension  4/5   was 4   Left Hip ABduction  4/5   was 4     Balance   Balance Assessed  Yes      Static Standing Balance   Static Standing - Balance Support  No upper extremity supported    Static Standing Balance -  Activities   Single Leg Stance - Right Leg;Single Leg Stance - Left Leg    Static Standing - Comment/# of Minutes  R: 38sec L: 46sec   was 21 R, 15sec L         PT Education - 07/06/18 1149    Education Details  early d/c and  HEP updates    Person(s) Educated  Patient    Methods  Explanation;Handout    Comprehension  Verbalized understanding         PT Short Term Goals - 07/06/18 1119      PT SHORT TERM GOAL #1   Title  Pt will be independent with HEP and perform consistently in order to decrease pain and promote return to PLOF.     Time  3    Period  Weeks    Status  Achieved      PT SHORT TERM GOAL #2   Title  Pt will have improved R SLS to 15 sec or > in order demo improved core and hip strength.    Baseline  10/14: 21sec on RLE    Time  3    Period  Weeks    Status  Achieved      PT SHORT TERM GOAL #3   Title  Pt will be able to perform 3MWT without increases in R buttock or lateral thigh pain to demo improved functional strength and maximize her ability to return to her regular walking program.    Baseline  10/14: increased by 14f and no increased pain in buttock or lateral thigh; has started to get back into her routine    Time  3    Period  Weeks    Status  Achieved        PT Long Term Goals - 07/06/18 1119      PT LONG TERM GOAL #1   Title  Pt will have 1 grade improvement in MMT throughout proximal hips in order to  decrease pain and maximize her ability to physically assist her sister.     Baseline  10/14: see MMT    Time  6    Period  Weeks    Status  Partially Met      PT LONG TERM GOAL #2   Title  Pt will be able to perform bil SLS for 30sec or > in order to further demo improved core and functional hip strength.    Baseline  10/23: 38sec R, 46sec L    Time  6    Period  Weeks    Status  Achieved      PT LONG TERM GOAL #3   Title  Pt will report decreased overall pain to 2/10 or better on a daily basis to demo improved overall function and maximize ADL and IADL completion.     Baseline  10/23: no pain for last 3 weeks    Time  6    Period  Weeks    Status  Achieved      PT LONG TERM GOAL #4   Title  Pt will report being able to perform all physical assistance for her sister independently with proper form in order to demo improve core and functional strength to demo return to PLOF and decrease risk for reinjury.    Baseline  10/23: independently providing assistance for sister without issue and with proper form    Time  6    Period  Weeks    Status  Achieved            Plan - 07/06/18 1149    Clinical Impression Statement  Pt presented to therapy stating that she has not been in pain for the last 3 weeks and has been feeling great. PT discussed with her a potential early d/c and pt was agreeable to this. PT  reassessed her goals this date. Pt has met all goals except for her strength LTG which has been partially met. Pt has no pain with any of her functional activities, she is back to providing full assistance for her sister without pain, and she has started to return to her walking program. At this time, pt does not require anymore skilled PT intervention and will be d/c to her HEP to continue to manage symptoms/strengthening at home independently; PT provided extensive HEP additions this date and she verbalized understanding.     Rehab Potential  Good    PT Frequency  2x / week    PT  Duration  6 weeks    PT Treatment/Interventions  ADLs/Self Care Home Management;Aquatic Therapy;Cryotherapy;Electrical Stimulation;Traction;Ultrasound;Gait training;Functional mobility training;Therapeutic activities;Stair training;Therapeutic exercise;Moist Heat;Balance training;Neuromuscular re-education;Patient/family education;Manual techniques;Passive range of motion;Dry needling;Energy conservation;Taping;Spinal Manipulations;Joint Manipulations    PT Next Visit Plan  d/c to HEP    PT Home Exercise Plan  eval: supine figure 4, supine piriformis stretch; 06/10/2018 - isometric abdominal, supine bridge 10/02: ITB stretch in supine; 10/14: clams GTB; 10/23: see below for extensive additions    Consulted and Agree with Plan of Care  Patient       Patient will benefit from skilled therapeutic intervention in order to improve the following deficits and impairments:  Decreased activity tolerance, Decreased balance, Decreased range of motion, Decreased strength, Difficulty walking, Hypomobility, Increased fascial restricitons, Increased muscle spasms, Impaired flexibility, Improper body mechanics, Postural dysfunction, Pain  Visit Diagnosis: Pain in right hip  Muscle weakness (generalized)  Other symptoms and signs involving the musculoskeletal system     Problem List Patient Active Problem List   Diagnosis Date Noted  . Osteopenia 08/17/2017  . Constipation 07/07/2017  . GAD (generalized anxiety disorder) 07/07/2017  . Depression, major, single episode, moderate ([place]) 07/01/2015  . Ankle sprain 02/27/2015  . OA (osteoarthritis) 02/27/2015  . Hip bursitis 02/27/2015  . Essential hypertension, benign 01/16/2015  . Diabetes mellitus type 2, controlled ([place]) 01/16/2015  . Hyperlipidemia 01/16/2015  . Left shoulder pain 01/16/2015  . Obesity 01/16/2015  . Glaucoma 01/16/2015       Geraldine Solar PT, DPT  [place], [place], [address] Phone: [phone]   Fax:  [phone]  Name: Victoria Mcconnell MRN: [id number] Date of Birth: 16-Apr-1946

## 2018-07-11 ENCOUNTER — Encounter (HOSPITAL_COMMUNITY): Payer: Medicare Other

## 2018-07-13 ENCOUNTER — Encounter (HOSPITAL_COMMUNITY): Payer: Medicare Other

## 2018-07-18 ENCOUNTER — Encounter (HOSPITAL_COMMUNITY): Payer: Medicare Other

## 2018-07-20 ENCOUNTER — Ambulatory Visit (HOSPITAL_COMMUNITY)
Admission: RE | Admit: 2018-07-20 | Discharge: 2018-07-20 | Disposition: A | Discharge status: Home / Self Care | Payer: Medicare Other | Source: Ambulatory Visit | Attending: Family Medicine | Admitting: Family Medicine

## 2018-07-20 ENCOUNTER — Encounter (HOSPITAL_COMMUNITY): Payer: Medicare Other

## 2018-07-20 DIAGNOSIS — Z1231 Encounter for screening mammogram for malignant neoplasm of breast: Secondary | ICD-10-CM | POA: Diagnosis not present

## 2018-08-08 ENCOUNTER — Other Ambulatory Visit: Payer: Self-pay | Admitting: Family Medicine

## 2018-08-16 ENCOUNTER — Other Ambulatory Visit: Payer: Self-pay | Admitting: *Deleted

## 2018-08-16 MED ORDER — ALPRAZOLAM 1 MG PO TABS: 1.0000 mg | ORAL_TABLET | Freq: Two times a day (BID) | ORAL | 1 refills | Status: DC | PRN

## 2018-08-16 NOTE — Telephone Encounter (Signed)
Received fax requesting refill on Xanax to mail order.   Ok to refill??  Last office visit 05/18/2018.  Last refill 04/18/2018, #1 refill to mail order.

## 2018-08-31 ENCOUNTER — Other Ambulatory Visit: Payer: Self-pay

## 2018-08-31 ENCOUNTER — Encounter: Payer: Self-pay | Admitting: Family Medicine

## 2018-08-31 ENCOUNTER — Ambulatory Visit (INDEPENDENT_AMBULATORY_CARE_PROVIDER_SITE_OTHER): Payer: Medicare Other | Admitting: Family Medicine

## 2018-08-31 VITALS — BP 114/68 | HR 82 | Temp 97.8°F | Resp 12 | Ht 68.0 in | Wt 202.0 lb

## 2018-08-31 DIAGNOSIS — Z6830 Body mass index (BMI) 30.0-30.9, adult: Secondary | ICD-10-CM

## 2018-08-31 DIAGNOSIS — E7800 Pure hypercholesterolemia, unspecified: Secondary | ICD-10-CM

## 2018-08-31 DIAGNOSIS — F411 Generalized anxiety disorder: Secondary | ICD-10-CM

## 2018-08-31 DIAGNOSIS — E119 Type 2 diabetes mellitus without complications: Secondary | ICD-10-CM | POA: Diagnosis not present

## 2018-08-31 DIAGNOSIS — I10 Essential (primary) hypertension: Secondary | ICD-10-CM | POA: Diagnosis not present

## 2018-08-31 DIAGNOSIS — E6609 Other obesity due to excess calories: Secondary | ICD-10-CM

## 2018-08-31 DIAGNOSIS — Z0000 Encounter for general adult medical examination without abnormal findings: Secondary | ICD-10-CM

## 2018-08-31 DIAGNOSIS — M8589 Other specified disorders of bone density and structure, multiple sites: Secondary | ICD-10-CM

## 2018-08-31 NOTE — Patient Instructions (Signed)
F/U 4 months  

## 2018-08-31 NOTE — Progress Notes (Signed)
Subjective:   Patient presents for Medicare Annual/Subsequent preventive examination.    Pt here to f/u chronic medical problems   HTM- diet controlled  last A1C 6.2% 4 months ago    HTN- tolerating B P meds norvasc, Diovan HCTZ   Hyperlipidemia- on statin drug , lipids normal in April, LDL  83    GAD- doing well with lexapro and xanax     PT was seen for back pain, completed physical therapy, and now doing home exercises, she is not on the  Muscle relaxers       Review Past Medical/Family/Social: Per EMR    Risk Factors  Current exercise habits: walks  Dietary issues discussed: Yes  Cardiac risk factors: Obesity (BMI >= 30 kg/m2). HTN, Hyperlipidemia   Depression Screen  (Note: if answer to either of the following is "Yes", a more complete depression screening is indicated)  Over the past two weeks, have you felt down, depressed or hopeless? No Over the past two weeks, have you felt little interest or pleasure in doing things? No Have you lost interest or pleasure in daily life? No Do you often feel hopeless? No Do you cry easily over simple problems? No   Activities of Daily Living  In your present state of health, do you have any difficulty performing the following activities?:  Driving? No  Managing money? No  Feeding yourself? No  Getting from bed to chair? No  Climbing a flight of stairs? No  Preparing food and eating?: No  Bathing or showering? No  Getting dressed: No  Getting to the toilet? No  Using the toilet:No  Moving around from place to place: No  In the past year have you fallen or had a near fall?:No  Are you sexually active? No  Do you have more than one partner? No   Hearing Difficulties: No  Do you often ask people to speak up or repeat themselves? No  Do you experience ringing or noises in your ears? No Do you have difficulty understanding soft or whispered voices? No  Do you feel that you have a problem with memory? No Do you often  misplace items? No  Do you feel safe at home? Yes  Cognitive Testing  Alert? Yes Normal Appearance?Yes  Oriented to person? Yes Place? Yes  Time? Yes  Recall of three objects? Yes  Can perform simple calculations? Yes  Displays appropriate judgment?Yes  Can read the correct time from a watch face?Yes   List the Names of Other Physician/Practitioners you currently use:   Dermatology- Dr. Mariel Kansky  Opthalomology - Dr. Loran Senters  Screening Tests / Date Colonoscopy    UTD                 Zostavax - UTD per pt, had many years ago  Mammogram UTD Influenza Vaccine UTD Pneumonia- UTD Bone Density-  Osteopenia, on vitamin D , nocalcum due to elevated  Tetanus/tdap Unable to afford  ROS: GEN- denies fatigue, fever, weight loss,weakness, recent illness HEENT- denies eye drainage, change in vision, nasal discharge, CVS- denies chest pain, palpitations RESP- denies SOB, cough, wheeze ABD- denies N/V, change in stools, abd pain GU- denies dysuria, hematuria, dribbling, incontinence MSK- denies joint pain, muscle aches, injury Neuro- denies headache, dizziness, syncope, seizure activity  PHYSICAL: vitals reviewed  GEN- NAD, alert and oriented x3 HEENT- PERRL, EOMI, non injected sclera, pink conjunctiva, MMM, oropharynx clear Neck- Supple, no thryomegaly, no bruit  CVS- RRR, no murmur RESP-CTAB ABD-NABS,soft,NT,ND Ext- No edema  Assessment:    Annual wellness medicare exam   Plan:    During the course of the visit the patient was educated and counseled about appropriate screening and preventive services including:   Fall / Audit C screening/ Depression - Negative   HTN- well controlled  DM- diet controlled , recheck A1C   Hyperlipidemia- recheck in April, continue lipitor  Immunizations UTD  Osteopenia- continue weight bearing exercise, vitamin D  Advanced directives           Diet review for nutrition referral? Yes ____ Not Indicated __x__  Patient  Instructions (the written plan) was given to the patient.  Medicare Attestation  I have personally reviewed:  The patient's medical and social history  Their use of alcohol, tobacco or illicit drugs  Their current medications and supplements  The patient's functional ability including ADLs,fall risks, home safety risks, cognitive, and hearing and visual impairment  Diet and physical activities  Evidence for depression or mood disorders  The patient's weight, height, BMI, and visual acuity have been recorded in the chart. I have made referrals, counseling, and provided education to the patient based on review of the above and I have provided the patient with a written personalized care plan for preventive services.

## 2018-09-01 LAB — HEMOGLOBIN A1C
HEMOGLOBIN A1C: 6 %{Hb} — AB (ref <5.7)
MEAN PLASMA GLUCOSE: 126 (calc)
eAG (mmol/L): 7 (calc)

## 2018-09-01 LAB — BASIC METABOLIC PANEL
BUN/Creatinine Ratio: 15 (calc) (ref 6–22)
BUN: 17 mg/dL (ref 7–25)
CALCIUM: 10.3 mg/dL (ref 8.6–10.4)
CO2: 26 mmol/L (ref 20–32)
Chloride: 104 mmol/L (ref 98–110)
Creat: 1.1 mg/dL — ABNORMAL HIGH (ref 0.60–0.93)
GLUCOSE: 93 mg/dL (ref 65–99)
Potassium: 3.8 mmol/L (ref 3.5–5.3)
Sodium: 140 mmol/L (ref 135–146)

## 2018-09-05 ENCOUNTER — Encounter: Payer: Self-pay | Admitting: *Deleted

## 2018-10-04 DIAGNOSIS — H401131 Primary open-angle glaucoma, bilateral, mild stage: Secondary | ICD-10-CM | POA: Diagnosis not present

## 2018-10-24 ENCOUNTER — Other Ambulatory Visit: Payer: Self-pay | Admitting: Family Medicine

## 2018-11-09 DIAGNOSIS — L811 Chloasma: Secondary | ICD-10-CM | POA: Diagnosis not present

## 2018-11-09 DIAGNOSIS — L738 Other specified follicular disorders: Secondary | ICD-10-CM | POA: Diagnosis not present

## 2018-11-09 DIAGNOSIS — L818 Other specified disorders of pigmentation: Secondary | ICD-10-CM | POA: Diagnosis not present

## 2018-11-09 DIAGNOSIS — L700 Acne vulgaris: Secondary | ICD-10-CM | POA: Diagnosis not present

## 2018-11-09 DIAGNOSIS — Z79899 Other long term (current) drug therapy: Secondary | ICD-10-CM | POA: Diagnosis not present

## 2019-01-02 ENCOUNTER — Ambulatory Visit: Payer: Medicare Other | Admitting: Family Medicine

## 2019-01-02 ENCOUNTER — Other Ambulatory Visit: Payer: Self-pay | Admitting: Family Medicine

## 2019-01-03 ENCOUNTER — Other Ambulatory Visit: Payer: Self-pay | Admitting: *Deleted

## 2019-01-03 MED ORDER — ALPRAZOLAM 1 MG PO TABS: 1.0000 mg | ORAL_TABLET | Freq: Two times a day (BID) | ORAL | 1 refills | Status: DC | PRN

## 2019-01-03 NOTE — Telephone Encounter (Signed)
Received fax from mail order requesting refill on Xanax.   Ok to refill??  Last office visit 08/31/2018.  Last refill 08/16/2018, #1 refill to mail order.

## 2019-03-05 ENCOUNTER — Other Ambulatory Visit: Payer: Self-pay | Admitting: Family Medicine

## 2019-03-16 ENCOUNTER — Ambulatory Visit: Payer: Medicare Other | Admitting: Family Medicine

## 2019-03-20 ENCOUNTER — Ambulatory Visit: Payer: Medicare Other | Admitting: Family Medicine

## 2019-03-31 ENCOUNTER — Ambulatory Visit: Payer: Medicare Other | Admitting: Family Medicine

## 2019-04-06 ENCOUNTER — Other Ambulatory Visit: Payer: Self-pay

## 2019-04-07 ENCOUNTER — Encounter: Payer: Self-pay | Admitting: Family Medicine

## 2019-04-07 ENCOUNTER — Ambulatory Visit (INDEPENDENT_AMBULATORY_CARE_PROVIDER_SITE_OTHER): Payer: Medicare Other | Admitting: Family Medicine

## 2019-04-07 VITALS — BP 130/78 | HR 96 | Temp 98.7°F | Resp 16 | Ht 68.0 in | Wt 204.0 lb

## 2019-04-07 DIAGNOSIS — R21 Rash and other nonspecific skin eruption: Secondary | ICD-10-CM | POA: Diagnosis not present

## 2019-04-07 DIAGNOSIS — F411 Generalized anxiety disorder: Secondary | ICD-10-CM | POA: Diagnosis not present

## 2019-04-07 DIAGNOSIS — B372 Candidiasis of skin and nail: Secondary | ICD-10-CM

## 2019-04-07 DIAGNOSIS — E119 Type 2 diabetes mellitus without complications: Secondary | ICD-10-CM

## 2019-04-07 DIAGNOSIS — E7800 Pure hypercholesterolemia, unspecified: Secondary | ICD-10-CM

## 2019-04-07 DIAGNOSIS — I10 Essential (primary) hypertension: Secondary | ICD-10-CM

## 2019-04-07 MED ORDER — CLOTRIMAZOLE-BETAMETHASONE 1-0.05 % EX CREA: 1.0000 "application " | TOPICAL_CREAM | Freq: Two times a day (BID) | CUTANEOUS | 1 refills | Status: DC

## 2019-04-07 MED ORDER — NYSTATIN 100000 UNIT/GM EX POWD: Freq: Four times a day (QID) | CUTANEOUS | 2 refills | Status: DC

## 2019-04-07 NOTE — Assessment & Plan Note (Signed)
Well controlled no changes 

## 2019-04-07 NOTE — Assessment & Plan Note (Signed)
Continue lexapro, prn xanax

## 2019-04-07 NOTE — Assessment & Plan Note (Signed)
Continue lipitor, check labs

## 2019-04-07 NOTE — Patient Instructions (Addendum)
F/U 5 months for physical  Use powder beneath breast and in groin Use cream on rash twice a day

## 2019-04-07 NOTE — Progress Notes (Signed)
   Subjective:    Patient ID: Victoria Mcconnell, female    DOB: 19-Aug-1946, 73 y.o.   MRN: [id number]  Patient presents for Follow-up (is fasting)  Patient here to follow-up chronic medical problems.  Medications reviewed.  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   Review Of Systems:  GEN- denies fatigue, fever, weight loss,weakness, recent illness HEENT- denies eye drainage, change in vision, nasal discharge, CVS- denies chest pain, palpitations RESP- denies SOB, cough, wheeze ABD- denies N/V, change in stools, abd pain GU- denies dysuria, hematuria, dribbling, incontinence MSK- denies joint pain, muscle aches, injury Neuro- denies headache, dizziness, syncope, seizure activity       Objective:    BP 130/78   Pulse 96   Temp 98.7 F (37.1 C) (Oral)   Resp 16   Ht 5\' 8"  (1.727 m)   Wt 204 lb (92.5 kg)   SpO2 96%   BMI 31.02 kg/m  GEN- NAD, alert and oriented x3 HEENT- PERRL, EOMI, non injected sclera, pink conjunctiva, MMM, oropharynx clear Neck- Supple, no thyromegaly CVS- RRR, no murmur RESP-CTAB ABD-NABS,soft,NT,ND Skin-oblong slightly raised erythematous rash 4x3cm long, excoriations in center, hypergmented in creases of groin, beneath breast  Excoriations on neck, no rash seen  Psych- normal affect and mood EXT- No edema Pulses- Radial, DP- 2+        Assessment & Plan:      Problem List Items Addressed This Visit      Unprioritized   Diabetes mellitus type 2, controlled ([place])    Diet controlled, goal A1c less than 7%' On statin drug  Check renal function      Relevant Orders   Hemoglobin A1c   HM DIABETES FOOT EXAM (Completed)   Essential hypertension, benign - Primary    Well  controlled no changes       Relevant Orders   CBC with Differential/Platelet   Comprehensive metabolic panel   GAD (generalized anxiety disorder)    Continue lexapro, prn xanax      Hyperlipidemia    Continue lipitor, check labs      Relevant Orders   Lipid panel    Other Visit Diagnoses    Rash and nonspecific skin eruption       lesion on abd looks fungal but also very pruritic given lotrisone   Candidal intertrigo       nystatin powder given   Relevant Medications   clotrimazole-betamethasone (LOTRISONE) cream   nystatin (MYCOSTATIN/NYSTOP) powder      Note: This dictation was prepared with Dragon dictation along with smaller phrase technology. Any transcriptional errors that result from this process are unintentional.

## 2019-04-07 NOTE — Assessment & Plan Note (Signed)
Diet controlled, goal A1c less than 7%' On statin drug  Check renal function

## 2019-04-08 LAB — COMPREHENSIVE METABOLIC PANEL
AG Ratio: 1.4 (calc) (ref 1.0–2.5)
ALT: 13 U/L (ref 6–29)
AST: 18 U/L (ref 10–35)
Albumin: 4.3 g/dL (ref 3.6–5.1)
Alkaline phosphatase (APISO): 72 U/L (ref 37–153)
BUN/Creatinine Ratio: 14 (calc) (ref 6–22)
BUN: 17 mg/dL (ref 7–25)
CO2: 25 mmol/L (ref 20–32)
Calcium: 10.4 mg/dL (ref 8.6–10.4)
Chloride: 107 mmol/L (ref 98–110)
Creat: 1.18 mg/dL — ABNORMAL HIGH (ref 0.60–0.93)
Globulin: 3.1 g/dL (calc) (ref 1.9–3.7)
Glucose, Bld: 97 mg/dL (ref 65–99)
Potassium: 3.9 mmol/L (ref 3.5–5.3)
Sodium: 139 mmol/L (ref 135–146)
Total Bilirubin: 0.6 mg/dL (ref 0.2–1.2)
Total Protein: 7.4 g/dL (ref 6.1–8.1)

## 2019-04-08 LAB — CBC WITH DIFFERENTIAL/PLATELET
Absolute Monocytes: 681 cells/uL (ref 200–950)
Basophils Absolute: 46 cells/uL (ref 0–200)
Basophils Relative: 0.5 %
Eosinophils Absolute: 147 cells/uL (ref 15–500)
Eosinophils Relative: 1.6 %
HCT: 35.6 % (ref 35.0–45.0)
Hemoglobin: 11.9 g/dL (ref 11.7–15.5)
Lymphs Abs: 1693 cells/uL (ref 850–3900)
MCH: 29.8 pg (ref 27.0–33.0)
MCHC: 33.4 g/dL (ref 32.0–36.0)
MCV: 89 fL (ref 80.0–100.0)
MPV: 11 fL (ref 7.5–12.5)
Monocytes Relative: 7.4 %
Neutro Abs: 6633 cells/uL (ref 1500–7800)
Neutrophils Relative %: 72.1 %
Platelets: 244 10*3/uL (ref 140–400)
RBC: 4 10*6/uL (ref 3.80–5.10)
RDW: 13.2 % (ref 11.0–15.0)
Total Lymphocyte: 18.4 %
WBC: 9.2 10*3/uL (ref 3.8–10.8)

## 2019-04-08 LAB — HEMOGLOBIN A1C
Hgb A1c MFr Bld: 6.1 % of total Hgb — ABNORMAL HIGH (ref <5.7)
Mean Plasma Glucose: 128 (calc)
eAG (mmol/L): 7.1 (calc)

## 2019-04-08 LAB — LIPID PANEL
Cholesterol: 152 mg/dL (ref <200)
HDL: 60 mg/dL (ref >=50)
LDL Cholesterol (Calc): 78 mg/dL (calc)
Non-HDL Cholesterol (Calc): 92 mg/dL (calc) (ref <130)
Total CHOL/HDL Ratio: 2.5 (calc) (ref <5.0)
Triglycerides: 59 mg/dL (ref <150)

## 2019-04-11 DIAGNOSIS — H401131 Primary open-angle glaucoma, bilateral, mild stage: Secondary | ICD-10-CM | POA: Diagnosis not present

## 2019-04-12 ENCOUNTER — Encounter: Payer: Self-pay | Admitting: *Deleted

## 2019-04-12 ENCOUNTER — Other Ambulatory Visit: Payer: Self-pay | Admitting: *Deleted

## 2019-05-10 DIAGNOSIS — L700 Acne vulgaris: Secondary | ICD-10-CM | POA: Diagnosis not present

## 2019-05-10 DIAGNOSIS — Z79899 Other long term (current) drug therapy: Secondary | ICD-10-CM | POA: Diagnosis not present

## 2019-05-10 DIAGNOSIS — L818 Other specified disorders of pigmentation: Secondary | ICD-10-CM | POA: Diagnosis not present

## 2019-05-10 DIAGNOSIS — L811 Chloasma: Secondary | ICD-10-CM | POA: Diagnosis not present

## 2019-05-10 DIAGNOSIS — L680 Hirsutism: Secondary | ICD-10-CM | POA: Diagnosis not present

## 2019-05-10 DIAGNOSIS — L309 Dermatitis, unspecified: Secondary | ICD-10-CM | POA: Diagnosis not present

## 2019-05-10 DIAGNOSIS — L669 Cicatricial alopecia, unspecified: Secondary | ICD-10-CM | POA: Diagnosis not present

## 2019-05-10 DIAGNOSIS — L738 Other specified follicular disorders: Secondary | ICD-10-CM | POA: Diagnosis not present

## 2019-05-13 ENCOUNTER — Other Ambulatory Visit: Payer: Self-pay | Admitting: Family Medicine

## 2019-05-23 ENCOUNTER — Ambulatory Visit (INDEPENDENT_AMBULATORY_CARE_PROVIDER_SITE_OTHER): Payer: Medicare Other

## 2019-05-23 ENCOUNTER — Other Ambulatory Visit: Payer: Self-pay

## 2019-05-23 DIAGNOSIS — Z23 Encounter for immunization: Secondary | ICD-10-CM

## 2019-06-05 ENCOUNTER — Other Ambulatory Visit: Payer: Self-pay

## 2019-06-05 NOTE — Patient Outreach (Signed)
[facility]) Care Management  06/05/2019  Victoria Mcconnell [birth year] [id number]   Medication Adherence call to Victoria Mcconnell [place] Identifiers Verify spoke with patient she is past due on Metformin 1000 mg patient explain she is no longer taking this medication doctor took her off. Victoria Mcconnell is showing past due under [place].   [place] Management Direct Dial [phone]  Fax [phone] [email or web address]

## 2019-06-19 ENCOUNTER — Other Ambulatory Visit (HOSPITAL_COMMUNITY): Payer: Self-pay | Admitting: Family Medicine

## 2019-06-19 DIAGNOSIS — Z1231 Encounter for screening mammogram for malignant neoplasm of breast: Secondary | ICD-10-CM

## 2019-07-11 DIAGNOSIS — L811 Chloasma: Secondary | ICD-10-CM | POA: Diagnosis not present

## 2019-07-24 ENCOUNTER — Ambulatory Visit (HOSPITAL_COMMUNITY)
Admission: RE | Admit: 2019-07-24 | Discharge: 2019-07-24 | Disposition: A | Discharge status: Home / Self Care | Payer: Medicare Other | Source: Ambulatory Visit | Attending: Family Medicine | Admitting: Family Medicine

## 2019-07-24 ENCOUNTER — Other Ambulatory Visit: Payer: Self-pay

## 2019-07-24 DIAGNOSIS — Z1231 Encounter for screening mammogram for malignant neoplasm of breast: Secondary | ICD-10-CM | POA: Insufficient documentation

## 2019-08-04 ENCOUNTER — Other Ambulatory Visit: Payer: Self-pay | Admitting: Family Medicine

## 2019-08-14 DIAGNOSIS — H52203 Unspecified astigmatism, bilateral: Secondary | ICD-10-CM | POA: Diagnosis not present

## 2019-08-14 DIAGNOSIS — E119 Type 2 diabetes mellitus without complications: Secondary | ICD-10-CM | POA: Diagnosis not present

## 2019-08-14 DIAGNOSIS — H5213 Myopia, bilateral: Secondary | ICD-10-CM | POA: Diagnosis not present

## 2019-08-14 DIAGNOSIS — H25813 Combined forms of age-related cataract, bilateral: Secondary | ICD-10-CM | POA: Diagnosis not present

## 2019-08-14 DIAGNOSIS — H401131 Primary open-angle glaucoma, bilateral, mild stage: Secondary | ICD-10-CM | POA: Diagnosis not present

## 2019-08-14 LAB — HM DIABETES EYE EXAM

## 2019-09-13 ENCOUNTER — Encounter: Payer: Self-pay | Admitting: Family Medicine

## 2019-09-13 ENCOUNTER — Ambulatory Visit (INDEPENDENT_AMBULATORY_CARE_PROVIDER_SITE_OTHER): Payer: Medicare Other | Admitting: Family Medicine

## 2019-09-13 ENCOUNTER — Other Ambulatory Visit: Payer: Self-pay

## 2019-09-13 VITALS — BP 126/64 | HR 90 | Temp 98.6°F | Resp 16 | Ht 68.0 in | Wt 204.0 lb

## 2019-09-13 DIAGNOSIS — Z0000 Encounter for general adult medical examination without abnormal findings: Secondary | ICD-10-CM

## 2019-09-13 DIAGNOSIS — I10 Essential (primary) hypertension: Secondary | ICD-10-CM

## 2019-09-13 DIAGNOSIS — E119 Type 2 diabetes mellitus without complications: Secondary | ICD-10-CM | POA: Diagnosis not present

## 2019-09-13 DIAGNOSIS — F321 Major depressive disorder, single episode, moderate: Secondary | ICD-10-CM

## 2019-09-13 DIAGNOSIS — M8589 Other specified disorders of bone density and structure, multiple sites: Secondary | ICD-10-CM

## 2019-09-13 DIAGNOSIS — F411 Generalized anxiety disorder: Secondary | ICD-10-CM

## 2019-09-13 DIAGNOSIS — R252 Cramp and spasm: Secondary | ICD-10-CM

## 2019-09-13 DIAGNOSIS — Z0001 Encounter for general adult medical examination with abnormal findings: Secondary | ICD-10-CM

## 2019-09-13 MED ORDER — LANCETS MISC: 1 refills | Status: DC

## 2019-09-13 MED ORDER — BLOOD GLUCOSE SYSTEM PAK KIT: PACK | 1 refills | Status: DC

## 2019-09-13 MED ORDER — BLOOD GLUCOSE TEST VI STRP: ORAL_STRIP | 1 refills | Status: DC

## 2019-09-13 MED ORDER — ESCITALOPRAM OXALATE 10 MG PO TABS: 10.0000 mg | ORAL_TABLET | Freq: Every day | ORAL | 1 refills | Status: DC

## 2019-09-13 NOTE — Patient Instructions (Addendum)
F/u 4 MONTHS  Lexapro increased to 10mg  for your nerves  We will call with lab results

## 2019-09-13 NOTE — Progress Notes (Signed)
Subjective:   Patient presents for Medicare Annual/Subsequent preventive examination.     DM- fasting blood have been up to the 130's , last A1C 6.1%  She needs a new monitor - Accu CHeck Aviva plus- checking CBG once a day     She has leg cramps at night some days- no problems during the day, Legs do not feel tired     HTN- taking bp meds as prescribed, no SE with medications    Increaesd stress with younger sister now on hospice and declining , taking lexapro 5mg  and her nerves are bad at times,she is sleeping good, and uses xanax as needed     [place]- she ran out of linzess       Review Past Medical/Family/Social: Per EMR    Risk Factors  Current exercise habits: stays active  Dietary issues discussed: yes  Cardiac risk factors: Obesity (BMI >= 30 kg/m2). DM, HTN  Depression Screen  (Note: if answer to either of the following is "Yes", a more complete depression screening is indicated)  Over the past two weeks, have you felt down, depressed or hopeless? yes Over the past two weeks, have you felt little interest or pleasure in doing things? No Have you lost interest or pleasure in daily life? No Do you often feel hopeless? No Do you cry easily over simple problems? No   Activities of Daily Living  In your present state of health, do you have any difficulty performing the following activities?:  Driving? No  Managing money? No  Feeding yourself? No  Getting from bed to chair? No  Climbing a flight of stairs? No  Preparing food and eating?: No  Bathing or showering? No  Getting dressed: No  Getting to the toilet? No  Using the toilet:No  Moving around from place to place: No  In the past year have you fallen or had a near fall?:No  Are you sexually active? No  Do you have more than one partner? No   Hearing Difficulties: No  Do you often ask people to speak up or repeat themselves? No  Do you experience ringing or noises in your ears? No Do you have  difficulty understanding soft or whispered voices? No  Do you feel that you have a problem with memory? No Do you often misplace items? No  Do you feel safe at home? Yes  Cognitive Testing  Alert? Yes Normal Appearance?Yes  Oriented to person? Yes Place? Yes  Time? Yes  Recall of three objects? Yes  Can perform simple calculations? Yes  Displays appropriate judgment?Yes  Can read the correct time from a watch face?Yes   List the Names of Other Physician/Practitioners you currently use:   Dr. Gershon Crane   Screening Tests / Date Colonoscopy      UTD                Zostavax - UTD Mammogram - UTD  2020 Influenza Vaccine UTD Pneumonia- UTD    GEN- denies fatigue, fever, weight loss,weakness, recent illness HEENT- denies eye drainage, change in vision, nasal discharge, CVS- denies chest pain, palpitations RESP- denies SOB, cough, wheeze ABD- denies N/V, change in stools, abd pain GU- denies dysuria, hematuria, dribbling, incontinence MSK- denies joint pain, +muscle aches, injury Neuro- denies headache, dizziness, syncope, seizure activity  Physical: GEN- NAD, alert and oriented x3 HEENT- PERRL, EOMI, non injected sclera, pink conjunctiva, MMM, oropharynx clear, TM clear bilat, no effusion Neck- Supple, no thryomegaly CVS- RRR, no murmur RESP-CTAB ABD-NABS,soft,NT,ND  Psych- normal affect and mood MSK- good ROM lower ext,  Neuro normal tone LE, strength in tact bilat  EXT- No edema Pulses- Radial, DP- 2+      Assessment:    Annual wellness medicare exam   Plan:    During the course of the visit the patient was educated and counseled about appropriate screening and preventive services including:   Prevention UTD, will hold on TDAP at this time    MDD/GAD- increased stress with pt sister on hospice, increase lexapro to 10mg  at bedtime, continue prn xanax,hospice services involved   DM- currently diet controlled, mildly elevated fastings, check A1C, if > 7% will start  medication   HTN- well controlled   Osteopenia- calcium and vitamin D  Discussed advanced directives given handout  For leg cramps, check mag andpotassium, increase hydration, will supplement with OTC mag based on results   Audit C/Fall screen negative    Fasting labs obtained    Diet review for nutrition referral? Yes ____ Not Indicated __x__  Patient Instructions (the written plan) was given to the patient.  Medicare Attestation  I have personally reviewed:  The patient's medical and social history  Their use of alcohol, tobacco or illicit drugs  Their current medications and supplements  The patient's functional ability including ADLs,fall risks, home safety risks, cognitive, and hearing and visual impairment  Diet and physical activities  Evidence for depression or mood disorders  The patient's weight, height, BMI, and visual acuity have been recorded in the chart. I have made referrals, counseling, and provided education to the patient based on review of the above and I have provided the patient with a written personalized care plan for preventive services.

## 2019-09-14 LAB — COMPREHENSIVE METABOLIC PANEL
AG Ratio: 1.6 (calc) (ref 1.0–2.5)
ALT: 13 U/L (ref 6–29)
AST: 18 U/L (ref 10–35)
Albumin: 4.5 g/dL (ref 3.6–5.1)
Alkaline phosphatase (APISO): 75 U/L (ref 37–153)
BUN/Creatinine Ratio: 17 (calc) (ref 6–22)
BUN: 19 mg/dL (ref 7–25)
CO2: 23 mmol/L (ref 20–32)
Calcium: 10.2 mg/dL (ref 8.6–10.4)
Chloride: 107 mmol/L (ref 98–110)
Creat: 1.13 mg/dL — ABNORMAL HIGH (ref 0.60–0.93)
Globulin: 2.8 g/dL (calc) (ref 1.9–3.7)
Glucose, Bld: 98 mg/dL (ref 65–99)
Potassium: 4.1 mmol/L (ref 3.5–5.3)
Sodium: 142 mmol/L (ref 135–146)
Total Bilirubin: 0.5 mg/dL (ref 0.2–1.2)
Total Protein: 7.3 g/dL (ref 6.1–8.1)

## 2019-09-14 LAB — CBC WITH DIFFERENTIAL/PLATELET
Absolute Monocytes: 476 cells/uL (ref 200–950)
Basophils Absolute: 70 cells/uL (ref 0–200)
Basophils Relative: 0.9 %
Eosinophils Absolute: 172 cells/uL (ref 15–500)
Eosinophils Relative: 2.2 %
HCT: 38.3 % (ref 35.0–45.0)
Hemoglobin: 12.5 g/dL (ref 11.7–15.5)
Lymphs Abs: 1466 cells/uL (ref 850–3900)
MCH: 29.3 pg (ref 27.0–33.0)
MCHC: 32.6 g/dL (ref 32.0–36.0)
MCV: 89.9 fL (ref 80.0–100.0)
MPV: 11 fL (ref 7.5–12.5)
Monocytes Relative: 6.1 %
Neutro Abs: 5616 cells/uL (ref 1500–7800)
Neutrophils Relative %: 72 %
Platelets: 224 10*3/uL (ref 140–400)
RBC: 4.26 10*6/uL (ref 3.80–5.10)
RDW: 12.8 % (ref 11.0–15.0)
Total Lymphocyte: 18.8 %
WBC: 7.8 10*3/uL (ref 3.8–10.8)

## 2019-09-14 LAB — LIPID PANEL
Cholesterol: 166 mg/dL (ref <200)
HDL: 74 mg/dL (ref >=50)
LDL Cholesterol (Calc): 78 mg/dL (calc)
Non-HDL Cholesterol (Calc): 92 mg/dL (calc) (ref <130)
Total CHOL/HDL Ratio: 2.2 (calc) (ref <5.0)
Triglycerides: 62 mg/dL (ref <150)

## 2019-09-14 LAB — HEMOGLOBIN A1C
Hgb A1c MFr Bld: 6 % of total Hgb — ABNORMAL HIGH (ref <5.7)
Mean Plasma Glucose: 126 (calc)
eAG (mmol/L): 7 (calc)

## 2019-09-14 LAB — MAGNESIUM: Magnesium: 2 mg/dL (ref 1.5–2.5)

## 2019-10-22 ENCOUNTER — Other Ambulatory Visit: Payer: Self-pay

## 2019-10-22 ENCOUNTER — Ambulatory Visit: Payer: Medicare Other | Attending: Internal Medicine

## 2019-10-22 DIAGNOSIS — Z23 Encounter for immunization: Secondary | ICD-10-CM | POA: Insufficient documentation

## 2019-10-22 NOTE — Progress Notes (Signed)
   Covid-19 Vaccination Clinic  Name:  Victoria Mcconnell    MRN: [id number] DOB: 1946/09/05  10/22/2019  Victoria Mcconnell was observed post Covid-19 immunization for 15 minutes without incidence. She was provided with Vaccine Information Sheet and instruction to access the V-Safe system.   Victoria Mcconnell was instructed to call 911 with any severe reactions post vaccine: [name] Kitchen Difficulty breathing  . Swelling of your face and throat  . A fast heartbeat  . A bad rash all over your body  . Dizziness and weakness    Immunizations Administered    Name Date Dose VIS Date Route   Moderna COVID-19 Vaccine 10/22/2019  3:46 PM 0.5 mL 08/15/2019 Intramuscular   Manufacturer: Moderna   Lot: [id number]   [id number]

## 2019-10-23 ENCOUNTER — Ambulatory Visit: Payer: Medicare Other

## 2019-11-22 ENCOUNTER — Ambulatory Visit: Payer: Medicare Other | Attending: Internal Medicine

## 2019-11-22 DIAGNOSIS — Z23 Encounter for immunization: Secondary | ICD-10-CM

## 2019-11-22 NOTE — Progress Notes (Signed)
   Covid-19 Vaccination Clinic  Name:  Victoria Mcconnell    MRN: [id number] DOB: 07-May-1946  11/22/2019  Victoria Mcconnell was observed post Covid-19 immunization for 15 minutes without incident. She was provided with Vaccine Information Sheet and instruction to access the V-Safe system.   Victoria Mcconnell was instructed to call 911 with any severe reactions post vaccine: [name] Kitchen Difficulty breathing  . Swelling of face and throat  . A fast heartbeat  . A bad rash all over body  . Dizziness and weakness   Immunizations Administered    Name Date Dose VIS Date Route   Moderna COVID-19 Vaccine 11/22/2019  2:38 PM 0.5 mL 08/15/2019 Intramuscular   Manufacturer: Moderna   Lot: [id number]   [id number]

## 2019-12-12 DIAGNOSIS — H401131 Primary open-angle glaucoma, bilateral, mild stage: Secondary | ICD-10-CM | POA: Diagnosis not present

## 2019-12-28 ENCOUNTER — Other Ambulatory Visit: Payer: Self-pay | Admitting: Family Medicine

## 2020-01-12 ENCOUNTER — Encounter: Payer: Self-pay | Admitting: Family Medicine

## 2020-01-12 ENCOUNTER — Ambulatory Visit (INDEPENDENT_AMBULATORY_CARE_PROVIDER_SITE_OTHER): Payer: Medicare Other | Admitting: Family Medicine

## 2020-01-12 ENCOUNTER — Other Ambulatory Visit: Payer: Self-pay

## 2020-01-12 VITALS — BP 120/68 | HR 72 | Temp 98.3°F | Resp 14 | Ht 68.0 in | Wt 212.0 lb

## 2020-01-12 DIAGNOSIS — E6609 Other obesity due to excess calories: Secondary | ICD-10-CM | POA: Diagnosis not present

## 2020-01-12 DIAGNOSIS — E7800 Pure hypercholesterolemia, unspecified: Secondary | ICD-10-CM

## 2020-01-12 DIAGNOSIS — I10 Essential (primary) hypertension: Secondary | ICD-10-CM | POA: Diagnosis not present

## 2020-01-12 DIAGNOSIS — E119 Type 2 diabetes mellitus without complications: Secondary | ICD-10-CM | POA: Diagnosis not present

## 2020-01-12 DIAGNOSIS — F321 Major depressive disorder, single episode, moderate: Secondary | ICD-10-CM

## 2020-01-12 DIAGNOSIS — Z6832 Body mass index (BMI) 32.0-32.9, adult: Secondary | ICD-10-CM

## 2020-01-12 NOTE — Patient Instructions (Signed)
F/U 4 months  

## 2020-01-12 NOTE — Assessment & Plan Note (Addendum)
Continue current medications - Lexapro.  Overall she has been handling the recent death of her sister fairly well with support of her brother.  No change in medication I will refill her alprazolam

## 2020-01-12 NOTE — Progress Notes (Signed)
   Subjective:    Patient ID: Victoria Mcconnell, female    DOB: 03/05/1946, 74 y.o.   MRN: [id number]  Patient presents for Follow-up (is fasting)  Patient here to follow-up chronic medical problems.  Medications reviewed.  Hypertension she is taking her blood pressure medicine as prescribed without any side effects, she has been checking BP at home and this has been good   Weight is up 8 pounds since her visit in December, diabetes has been diet controlled her last A1c December was 6%, she has noticed her fasting running higher and some changes in vision . Seen by Dr. Luvenia Redden given a new drop for the glaucoma     Hyperlipidemia she is taking Lipitor 20 mg once a day  COVID-19 vaccine UTD    MDD/anxiety- her sister passed away in 11/16/22, she was hospice.  She feels like her medications have been helping her.  She is also relying on her brother who is her last living sibling     Currently working out 3 days a week with [employer]  Review Of Systems:  GEN- denies fatigue, fever, weight loss,weakness, recent illness HEENT- denies eye drainage, change in vision, nasal discharge, CVS- denies chest pain, palpitations RESP- denies SOB, cough, wheeze ABD- denies N/V, change in stools, abd pain GU- denies dysuria, hematuria, dribbling, incontinence MSK- denies joint pain, muscle aches, injury Neuro- denies headache, dizziness, syncope, seizure activity       Objective:    BP 120/68   Pulse 72   Temp 98.3 F (36.8 C) (Temporal)   Resp 14   Ht 5\' 8"  (1.727 m)   Wt 212 lb (96.2 kg)   SpO2 97%   BMI 32.23 kg/m  GEN- NAD, alert and oriented x3 HEENT- PERRL, EOMI, non injected sclera, pink conjunctiva, MMM, oropharynx clear Neck- Supple, no thyromegaly CVS- RRR, no murmur RESP-CTAB ABD-NABS,soft,NT,ND Psych pleasant normal affect and mood depressed appearing EXT- No edema Pulses- Radial, DP- 2+        Assessment & Plan:      Problem List Items Addressed This Visit       Unprioritized   Depression, major, single episode, moderate ([facility])    Continue current medications - Lexapro.  Overall she has been handling the recent death of her sister fairly well with support of her brother.  No change in medication I will refill her alprazolam      Diabetes mellitus type 2, controlled ([facility])    Recheck A1c goal is less than 7%.  She has been some weight over the past few months .  We did talk about reducing sugars in the diet      Relevant Orders   Hemoglobin A1c   Microalbumin / creatinine urine ratio   Essential hypertension, benign - Primary    Blood pressure well controlled no change in medication.      Relevant Orders   CBC with Differential/Platelet   Comprehensive metabolic panel   Hyperlipidemia    Continue statin drug we will check her lipid panel. Goal is LDL less than 100      Relevant Orders   Lipid panel   Obesity      Note: This dictation was prepared with Dragon dictation along with smaller phrase technology. Any transcriptional errors that result from this process are unintentional.

## 2020-01-12 NOTE — Assessment & Plan Note (Signed)
Blood pressure well controlled no change in medication 

## 2020-01-12 NOTE — Assessment & Plan Note (Addendum)
Continue statin drug we will check her lipid panel. Goal is LDL less than 100

## 2020-01-12 NOTE — Assessment & Plan Note (Addendum)
Recheck A1c goal is less than 7%.  She has been some weight over the past few months .  We did talk about reducing sugars in the diet

## 2020-01-13 LAB — CBC WITH DIFFERENTIAL/PLATELET
Absolute Monocytes: 568 cells/uL (ref 200–950)
Basophils Absolute: 72 cells/uL (ref 0–200)
Basophils Relative: 0.9 %
Eosinophils Absolute: 184 cells/uL (ref 15–500)
Eosinophils Relative: 2.3 %
HCT: 36.9 % (ref 35.0–45.0)
Hemoglobin: 12.2 g/dL (ref 11.7–15.5)
Lymphs Abs: 1952 cells/uL (ref 850–3900)
MCH: 30 pg (ref 27.0–33.0)
MCHC: 33.1 g/dL (ref 32.0–36.0)
MCV: 90.7 fL (ref 80.0–100.0)
MPV: 11 fL (ref 7.5–12.5)
Monocytes Relative: 7.1 %
Neutro Abs: 5224 cells/uL (ref 1500–7800)
Neutrophils Relative %: 65.3 %
Platelets: 222 10*3/uL (ref 140–400)
RBC: 4.07 10*6/uL (ref 3.80–5.10)
RDW: 13.5 % (ref 11.0–15.0)
Total Lymphocyte: 24.4 %
WBC: 8 10*3/uL (ref 3.8–10.8)

## 2020-01-13 LAB — COMPREHENSIVE METABOLIC PANEL
AG Ratio: 1.5 (calc) (ref 1.0–2.5)
ALT: 11 U/L (ref 6–29)
AST: 17 U/L (ref 10–35)
Albumin: 4.2 g/dL (ref 3.6–5.1)
Alkaline phosphatase (APISO): 71 U/L (ref 37–153)
BUN/Creatinine Ratio: 17 (calc) (ref 6–22)
BUN: 21 mg/dL (ref 7–25)
CO2: 24 mmol/L (ref 20–32)
Calcium: 10.5 mg/dL — ABNORMAL HIGH (ref 8.6–10.4)
Chloride: 108 mmol/L (ref 98–110)
Creat: 1.24 mg/dL — ABNORMAL HIGH (ref 0.60–0.93)
Globulin: 2.8 g/dL (calc) (ref 1.9–3.7)
Glucose, Bld: 108 mg/dL — ABNORMAL HIGH (ref 65–99)
Potassium: 3.9 mmol/L (ref 3.5–5.3)
Sodium: 141 mmol/L (ref 135–146)
Total Bilirubin: 0.7 mg/dL (ref 0.2–1.2)
Total Protein: 7 g/dL (ref 6.1–8.1)

## 2020-01-13 LAB — LIPID PANEL
Cholesterol: 166 mg/dL (ref <200)
HDL: 68 mg/dL (ref >=50)
LDL Cholesterol (Calc): 84 mg/dL (calc)
Non-HDL Cholesterol (Calc): 98 mg/dL (calc) (ref <130)
Total CHOL/HDL Ratio: 2.4 (calc) (ref <5.0)
Triglycerides: 61 mg/dL (ref <150)

## 2020-01-13 LAB — HEMOGLOBIN A1C
Hgb A1c MFr Bld: 6 % of total Hgb — ABNORMAL HIGH (ref <5.7)
Mean Plasma Glucose: 126 (calc)
eAG (mmol/L): 7 (calc)

## 2020-01-13 LAB — MICROALBUMIN / CREATININE URINE RATIO
Creatinine, Urine: 71 mg/dL (ref 20–275)
Microalb Creat Ratio: 8 mcg/mg creat (ref <30)
Microalb, Ur: 0.6 mg/dL

## 2020-01-16 ENCOUNTER — Other Ambulatory Visit: Payer: Self-pay | Admitting: Family Medicine

## 2020-01-23 ENCOUNTER — Encounter: Payer: Self-pay | Admitting: *Deleted

## 2020-02-08 DIAGNOSIS — H401131 Primary open-angle glaucoma, bilateral, mild stage: Secondary | ICD-10-CM | POA: Diagnosis not present

## 2020-02-21 ENCOUNTER — Other Ambulatory Visit: Payer: Self-pay | Admitting: *Deleted

## 2020-02-21 DIAGNOSIS — L738 Other specified follicular disorders: Secondary | ICD-10-CM | POA: Diagnosis not present

## 2020-02-21 DIAGNOSIS — L811 Chloasma: Secondary | ICD-10-CM | POA: Diagnosis not present

## 2020-02-21 MED ORDER — BLOOD GLUCOSE SYSTEM PAK KIT: PACK | 1 refills | Status: DC

## 2020-02-21 MED ORDER — LANCETS MISC: 1 refills | Status: DC

## 2020-02-21 MED ORDER — BLOOD GLUCOSE TEST VI STRP: ORAL_STRIP | 1 refills | Status: DC

## 2020-03-04 ENCOUNTER — Telehealth: Payer: Self-pay | Admitting: Family Medicine

## 2020-03-04 NOTE — Progress Notes (Signed)
  Chronic Care Management   Note  03/04/2020 Name: VIVEKA WILMETH MRN: [id number] DOB: Nov 29, 1945  [name] is a 74 y.o. year old female who is a primary care patient of Portis, Modena Nunnery, MD. I reached out to [name] by phone today in response to a referral sent by Ms. Connye Burkitt Frasco's PCP, Buelah Manis, Modena Nunnery, MD.   Ms. Martes was given information about Chronic Care Management services today including:  1. CCM service includes personalized support from designated clinical staff supervised by her physician, including individualized plan of care and coordination with other care providers 2. 24/7 contact phone numbers for assistance for urgent and routine care needs. 3. Service will only be billed when office clinical staff spend 20 minutes or more in a month to coordinate care. 4. Only one practitioner may furnish and bill the service in a calendar month. 5. The patient may stop CCM services at any time (effective at the end of the month) by phone call to the office staff.   Patient agreed to services and verbal consent obtained.   Follow up plan:   [place]

## 2020-03-22 ENCOUNTER — Encounter: Payer: Self-pay | Admitting: Family Medicine

## 2020-03-22 ENCOUNTER — Ambulatory Visit (INDEPENDENT_AMBULATORY_CARE_PROVIDER_SITE_OTHER): Payer: Medicare Other | Admitting: Family Medicine

## 2020-03-22 ENCOUNTER — Other Ambulatory Visit: Payer: Self-pay

## 2020-03-22 VITALS — BP 132/78 | HR 88 | Temp 98.8°F | Resp 16 | Ht 68.0 in | Wt 218.0 lb

## 2020-03-22 DIAGNOSIS — M51369 Other intervertebral disc degeneration, lumbar region without mention of lumbar back pain or lower extremity pain: Secondary | ICD-10-CM

## 2020-03-22 DIAGNOSIS — M5441 Lumbago with sciatica, right side: Secondary | ICD-10-CM | POA: Diagnosis not present

## 2020-03-22 DIAGNOSIS — M5136 Other intervertebral disc degeneration, lumbar region: Secondary | ICD-10-CM | POA: Diagnosis not present

## 2020-03-22 MED ORDER — METHYLPREDNISOLONE 4 MG PO TBPK: ORAL_TABLET | ORAL | 0 refills | Status: DC

## 2020-03-22 MED ORDER — LINACLOTIDE 72 MCG PO CAPS: 72.0000 ug | ORAL_CAPSULE | Freq: Every day | ORAL | 3 refills | Status: DC

## 2020-03-22 NOTE — Patient Instructions (Signed)
F/U as previous Take steroids Tylenol 500mg -650mg   twice a day as needed

## 2020-03-22 NOTE — Addendum Note (Signed)
Addended by: Vic Blackbird F on: 03/22/2020 09:49 AM   Modules accepted: Orders

## 2020-03-22 NOTE — Progress Notes (Signed)
Subjective:    Patient ID: Victoria Mcconnell, female    DOB: 19-Dec-1945, 74 y.o.   MRN: [id number]  Patient presents for Back Pain (x months- lower back pain radiating down B LE, worse when she stands up- burning sensation down back of legs)   Approx 1 month ago, when she started exercising she began to have some lower back pain.  now 3 weeks into her exercise, she has pain that shoots down her lower back and down her right buttocks sleeping on occasion she will get some discomfort in the left side.  She mostly has the pain when she is standing straight up.  When she is sitting she does not have any pain.  She is able to go upstairs.  She does admit that it has improved over the past couple weeks she has been using over-the-counter rubs and also taking Aleve.  Denies any new tingling or numbness in the lower extremities no change in bowel or bladder.   Physical activity : M/W does upper body , Tuesday Thursday, uses resistantce bands/ core, Wed is mostly Water quality scientist spine 2010   IMPRESSION:  Advanced facet arthropathy in the lower lumbar spine, particularly  at L4-5 where there is 3 mm of anterolisthesis. Mild disc space  narrowing of the lower lumbar spine. The findings could certainly  be associated with the described symptoms.    Review Of Systems:  GEN- denies fatigue, fever, weight loss,weakness, recent illness HEENT- denies eye drainage, change in vision, nasal discharge, CVS- denies chest pain, palpitations RESP- denies SOB, cough, wheeze ABD- denies N/V, change in stools, abd pain GU- denies dysuria, hematuria, dribbling, incontinence MSK-+ joint pain, muscle aches, injury Neuro- denies headache, dizziness, syncope, seizure activity       Objective:    BP 132/78   Pulse 88   Temp 98.8 F (37.1 C) (Temporal)   Resp 16   Ht 5\' 8"  (1.727 m)   Wt 218 lb (98.9 kg)   SpO2 96%   BMI 33.15 kg/m  GEN- NAD, alert and oriented x3 CVS- RRR, no murmur RESP-CTAB MSK-  Mild TTP paraspinals, no spasm, fair ROM spine, Spine NT, +SLR right side,  fair ROM HIPS/KNEES NEURO- strength in tact LE, sensation in tact LE, slight antalgic gait EXT- No edema Pulses- Radial, DP- 2+        Assessment & Plan:      Problem List Items Addressed This Visit      Unprioritized   DDD (degenerative disc disease), lumbar    Acute on chronic back pain with new exercise routine No red flags Known DDD and mild disc bulge in lumbar region D.C NSAIDS due to HTN and renal function Medrol dose pak given for nerve inflammation Otherwise take acetoinophen BID for pain epson salt soaks Discussed referral to PT to learn appropriate exercise and core training, she declines at this time Discusses ways to reduce strain on back with modifying  She does state pain has improved as she took some rest days      Relevant Medications   methylPREDNISolone (MEDROL DOSEPAK) 4 MG TBPK tablet    Other Visit Diagnoses    Acute right-sided low back pain with right-sided sciatica    -  Primary   Relevant Medications   methylPREDNISolone (MEDROL DOSEPAK) 4 MG TBPK tablet      Note: This dictation was prepared with Dragon dictation along with smaller phrase technology. Any transcriptional errors that result from this process are unintentional.

## 2020-03-22 NOTE — Assessment & Plan Note (Signed)
Acute on chronic back pain with new exercise routine No red flags Known DDD and mild disc bulge in lumbar region D.C NSAIDS due to HTN and renal function Medrol dose pak given for nerve inflammation Otherwise take acetoinophen BID for pain epson salt soaks Discussed referral to PT to learn appropriate exercise and core training, she declines at this time Discusses ways to reduce strain on back with modifying  She does state pain has improved as she took some rest days

## 2020-04-16 ENCOUNTER — Other Ambulatory Visit: Payer: Self-pay | Admitting: Family Medicine

## 2020-04-24 ENCOUNTER — Ambulatory Visit (HOSPITAL_COMMUNITY)
Admission: RE | Admit: 2020-04-24 | Discharge: 2020-04-24 | Disposition: A | Discharge status: Home / Self Care | Payer: Medicare Other | Source: Ambulatory Visit | Attending: Family Medicine | Admitting: Family Medicine

## 2020-04-24 ENCOUNTER — Other Ambulatory Visit: Payer: Self-pay

## 2020-04-24 ENCOUNTER — Encounter: Payer: Self-pay | Admitting: Family Medicine

## 2020-04-24 ENCOUNTER — Ambulatory Visit (INDEPENDENT_AMBULATORY_CARE_PROVIDER_SITE_OTHER): Payer: Medicare Other | Admitting: Family Medicine

## 2020-04-24 VITALS — BP 136/70 | HR 90 | Temp 97.9°F | Resp 14 | Ht 68.0 in | Wt 216.0 lb

## 2020-04-24 DIAGNOSIS — M5410 Radiculopathy, site unspecified: Secondary | ICD-10-CM | POA: Diagnosis not present

## 2020-04-24 DIAGNOSIS — M5136 Other intervertebral disc degeneration, lumbar region: Secondary | ICD-10-CM

## 2020-04-24 DIAGNOSIS — M545 Low back pain: Secondary | ICD-10-CM | POA: Diagnosis not present

## 2020-04-24 DIAGNOSIS — M4316 Spondylolisthesis, lumbar region: Secondary | ICD-10-CM | POA: Diagnosis not present

## 2020-04-24 MED ORDER — TRAMADOL HCL 50 MG PO TABS: 50.0000 mg | ORAL_TABLET | Freq: Three times a day (TID) | ORAL | 1 refills | Status: AC | PRN

## 2020-04-24 MED ORDER — GABAPENTIN 100 MG PO CAPS: ORAL_CAPSULE | ORAL | 1 refills | Status: DC

## 2020-04-24 NOTE — Patient Instructions (Addendum)
Get xray done at [facility] today MRI to be scheduled  Take gabapentin 1 capsule at bedtime  Ultram for pain  F/U as previous

## 2020-04-24 NOTE — Assessment & Plan Note (Addendum)
Patient with ongoing back pain.  There is no improvement with the Medrol Dosepak.  I think that she warrants further imaging.  She has been doing exercises at home as well which have not helped.  We will start her on gabapentin 100 mg at bedtime for the nerve pain.  Ultram prn   Obtain x-ray today and then proceed with MRI.  I am concerned that she may have some spinal stenosis or other disc bulge without impingement.  I think she would be a good candidate for epidural injections based on MRI.

## 2020-04-24 NOTE — Progress Notes (Signed)
   Subjective:    Patient ID: Victoria Mcconnell, female    DOB: 13-Feb-1946, 74 y.o.   MRN: [id number]  Patient presents for Back Pain (R>L, radiates down B legs)  Here with ongoingchronic back pain.  She had a visit last month secondary to back pain with right-sided sciatica symptoms.  She has known degenerative disc disease in her lumbar spine. She has been trying to stay active to help with her pain. At that visit she was given Medrol Dosepak to help with inflammation.  Avoiding NSAIDs secondary to her renal function and hypertension.  Is also advised she can take acetaminophen.  We discussed referral to physical therapy but she declined at that time. Medrol dose pack did not work for her pain  Her last set of imaging of her back was in 2010  Review Of Systems:  GEN- denies fatigue, fever, weight loss,weakness, recent illness HEENT- denies eye drainage, change in vision, nasal discharge, CVS- denies chest pain, palpitations RESP- denies SOB, cough, wheeze ABD- denies N/V, change in stools, abd pain GU- denies dysuria, hematuria, dribbling, incontinence MSK-+ joint pain, muscle aches, injury Neuro- denies headache, dizziness, syncope, seizure activity       Objective:    BP 136/70   Pulse 90   Temp 97.9 F (36.6 C) (Temporal)   Resp 14   Ht 5\' 8"  (1.727 m)   Wt 216 lb (98 kg)   SpO2 96%   BMI 32.84 kg/m  GEN- NAD, alert and oriented x3 CVS- RRR, no murmur RESP-CTAB MSK- Mild TTP paraspinals, no spasm, decreased ROM spine, Spine NT, +SLR right side,  fair ROM HIPS/KNEES NEURO- decreased strength RLE compared to left , sensation in tact LE, slight antalgic gait , DTR symmetric  EXT- No edema Pulses- Radial, DP- 2+       Assessment & Plan:      Problem List Items Addressed This Visit      Unprioritized   DDD (degenerative disc disease), lumbar - Primary    Patient with ongoing back pain.  There is no improvement with the Medrol Dosepak.  I think that she warrants  further imaging.  She has been doing exercises at home as well which have not helped.  We will start her on gabapentin 100 mg at bedtime for the nerve pain.  Ultram prn   Obtain x-ray today and then proceed with MRI.  I am concerned that she may have some spinal stenosis or other disc bulge without impingement.  I think she would be a good candidate for epidural injections based on MRI.      Relevant Medications   traMADol (ULTRAM) 50 MG tablet   Other Relevant Orders   DG Lumbar Spine Complete   MR Lumbar Spine Wo Contrast    Other Visit Diagnoses    Back pain with right-sided radiculopathy       Relevant Medications   gabapentin (NEURONTIN) 100 MG capsule   traMADol (ULTRAM) 50 MG tablet   Other Relevant Orders   DG Lumbar Spine Complete   MR Lumbar Spine Wo Contrast   Other intervertebral disc degeneration, lumbar region       Relevant Medications   traMADol (ULTRAM) 50 MG tablet   Other Relevant Orders   MR Lumbar Spine Wo Contrast      Note: This dictation was prepared with Dragon dictation along with smaller phrase technology. Any transcriptional errors that result from this process are unintentional.

## 2020-04-29 ENCOUNTER — Other Ambulatory Visit: Payer: Self-pay

## 2020-04-29 ENCOUNTER — Ambulatory Visit: Payer: Self-pay | Admitting: Pharmacist

## 2020-04-29 ENCOUNTER — Ambulatory Visit: Payer: Medicare Other | Admitting: Pharmacist

## 2020-04-29 DIAGNOSIS — M5136 Other intervertebral disc degeneration, lumbar region: Secondary | ICD-10-CM

## 2020-04-29 DIAGNOSIS — I10 Essential (primary) hypertension: Secondary | ICD-10-CM

## 2020-04-29 DIAGNOSIS — E119 Type 2 diabetes mellitus without complications: Secondary | ICD-10-CM

## 2020-04-29 DIAGNOSIS — E7800 Pure hypercholesterolemia, unspecified: Secondary | ICD-10-CM

## 2020-04-29 NOTE — Progress Notes (Signed)
  Chronic Care Management   Follow Up Note   04/29/2020 Name: Victoria Mcconnell MRN: [id number] DOB: Aug 28, 1946  Referred by: Alycia Rossetti, MD Reason for referral : No chief complaint on file.   Victoria Mcconnell is a 74 y.o. year old female who is a primary care patient of [place], Modena Nunnery, MD. The CCM team was consulted for assistance with chronic disease management and care coordination needs.    Review of patient status, including review of consultants reports, relevant laboratory and other test results, and collaboration with appropriate care team members and the patient's provider was performed as part of comprehensive patient evaluation and provision of chronic care management services.    SDOH (Social Determinants of Health) assessments performed: No See Care Plan activities for detailed interventions related to [facility])     Outpatient Encounter Medications as of 04/29/2020  Medication Sig  . ALPRAZolam (XANAX) 1 MG tablet Take 1 tablet (1 mg total) by mouth 2 (two) times daily as needed.  [name] Kitchen amLODipine (NORVASC) 5 MG tablet TAKE 1 TABLET BY MOUTH  DAILY  . atorvastatin (LIPITOR) 20 MG tablet TAKE 1 TABLET BY MOUTH  DAILY  . Blood Glucose Monitoring Suppl (BLOOD GLUCOSE SYSTEM PAK) KIT Please dispense based on patient and insurance preference. Use as directed to monitor FSBS 1x daily. Dx: E11.9.  . brimonidine-timolol (COMBIGAN) 0.2-0.5 % ophthalmic solution INSTILL 1 DROP INTO BOTH EYES TWICE A DAY  . cholecalciferol (VITAMIN D) 1000 units tablet Take 1 tablet (1,000 Units total) daily by mouth.  . escitalopram (LEXAPRO) 10 MG tablet TAKE 1 TABLET BY MOUTH  DAILY (Patient not taking: Reported on 04/29/2020)  . gabapentin (NEURONTIN) 100 MG capsule Take 1-2 capsules at bedtime  . Glucose Blood (BLOOD GLUCOSE TEST STRIPS) STRP Please dispense based on patient and insurance preference. Use as directed to monitor FSBS 1x daily. Dx: E11.9.  . Lancets MISC Please dispense based on patient  and insurance preference. Use as directed to monitor FSBS 1x daily. Dx: E11.9.  . latanoprost (XALATAN) 0.005 % ophthalmic solution Place 1 drop into both eyes at bedtime.  [name] Kitchen linaclotide (LINZESS) 72 MCG capsule Take 1 capsule (72 mcg total) by mouth daily before breakfast.  . montelukast (SINGULAIR) 10 MG tablet TAKE 1 TABLET BY MOUTH AT  BEDTIME  . traMADol (ULTRAM) 50 MG tablet Take 1 tablet (50 mg total) by mouth every 8 (eight) hours as needed for up to 5 days.  . valsartan-hydrochlorothiazide (DIOVAN-HCT) 160-25 MG tablet TAKE 1 TABLET BY MOUTH  DAILY   No facility-administered encounter medications on file as of 04/29/2020.     Objective:  Patient is in the donut hole and in need of help reducing copays where possible on her medications. Goals Addressed   None     Faxed Dr. Gershon Crane to see if he could split up her Combigan into two separate generic eye drops since each is available individually.  Plan:   The patient has been provided with contact information for the care management team and has been advised to call with any health related questions or concerns.    Beverly Milch, PharmD Clinical Pharmacist [place] [phone]

## 2020-04-29 NOTE — Patient Instructions (Addendum)
Visit Information Thank you for meeting with me today!  I look forward to working with you to help you meet all of your healthcare goals and answer any questions you may have.  Feel free to contact me anytime!  Goals Addressed   None     Victoria Mcconnell was given information about Chronic Care Management services today including:  1. CCM service includes personalized support from designated clinical staff supervised by her physician, including individualized plan of care and coordination with other care providers 2. 24/7 contact phone numbers for assistance for urgent and routine care needs. 3. Standard insurance, coinsurance, copays and deductibles apply for chronic care management only during months in which we provide at least 20 minutes of these services. Most insurances cover these services at 100%, however patients may be responsible for any copay, coinsurance and/or deductible if applicable. This service may help you avoid the need for more expensive face-to-face services. 4. Only one practitioner may furnish and bill the service in a calendar month. 5. The patient may stop CCM services at any time (effective at the end of the month) by phone call to the office staff.  Patient agreed to services and verbal consent obtained.   The patient verbalized understanding of instructions provided today and agreed to receive a mailed copy of patient instruction and/or educational materials. Telephone follow up appointment with pharmacy team member scheduled for: 6 months  Beverly Milch, PharmD Clinical Pharmacist [place] [phone]   Carbohydrate Counting for Diabetes Mellitus, Adult  Carbohydrate counting is a method of keeping track of how many carbohydrates you eat. Eating carbohydrates naturally increases the amount of sugar (glucose) in the blood. Counting how many carbohydrates you eat helps keep your blood glucose within normal limits, which helps you manage your  diabetes (diabetes mellitus). It is important to know how many carbohydrates you can safely have in each meal. This is different for every person. A diet and nutrition specialist (registered dietitian) can help you make a meal plan and calculate how many carbohydrates you should have at each meal and snack. Carbohydrates are found in the following foods:  Grains, such as breads and cereals.  Dried beans and soy products.  Starchy vegetables, such as potatoes, peas, and corn.  Fruit and fruit juices.  Milk and yogurt.  Sweets and snack foods, such as cake, cookies, candy, chips, and soft drinks. How do I count carbohydrates? There are two ways to count carbohydrates in food. You can use either of the methods or a combination of both. Reading "Nutrition Facts" on packaged food The "Nutrition Facts" list is included on the labels of almost all packaged foods and beverages in the [place] It includes:  The serving size.  Information about nutrients in each serving, including the grams (g) of carbohydrate per serving. To use the "Nutrition Facts":  Decide how many servings you will have.  Multiply the number of servings by the number of carbohydrates per serving.  The resulting number is the total amount of carbohydrates that you will be having. Learning standard serving sizes of other foods When you eat carbohydrate foods that are not packaged or do not include "Nutrition Facts" on the label, you need to measure the servings in order to count the amount of carbohydrates:  Measure the foods that you will eat with a food scale or measuring cup, if needed.  Decide how many standard-size servings you will eat.  Multiply the number of servings by 15. Most carbohydrate-rich  foods have about 15 g of carbohydrates per serving. ? For example, if you eat 8 oz (170 g) of strawberries, you will have eaten 2 servings and 30 g of carbohydrates (2 servings x 15 g = 30 g).  For foods that have more  than one food mixed, such as soups and casseroles, you must count the carbohydrates in each food that is included. The following list contains standard serving sizes of common carbohydrate-rich foods. Each of these servings has about 15 g of carbohydrates:   hamburger bun or  English muffin.   oz (15 mL) syrup.   oz (14 g) jelly.  1 slice of bread.  1 six-inch tortilla.  3 oz (85 g) cooked rice or pasta.  4 oz (113 g) cooked dried beans.  4 oz (113 g) starchy vegetable, such as peas, corn, or potatoes.  4 oz (113 g) hot cereal.  4 oz (113 g) mashed potatoes or  of a large baked potato.  4 oz (113 g) canned or frozen fruit.  4 oz (120 mL) fruit juice.  4-6 crackers.  6 chicken nuggets.  6 oz (170 g) unsweetened dry cereal.  6 oz (170 g) plain fat-free yogurt or yogurt sweetened with artificial sweeteners.  8 oz (240 mL) milk.  8 oz (170 g) fresh fruit or one small piece of fruit.  24 oz (680 g) popped popcorn. Example of carbohydrate counting Sample meal  3 oz (85 g) chicken breast.  6 oz (170 g) brown rice.  4 oz (113 g) corn.  8 oz (240 mL) milk.  8 oz (170 g) strawberries with sugar-free whipped topping. Carbohydrate calculation 1. Identify the foods that contain carbohydrates: ? Rice. ? Corn. ? Milk. ? Strawberries. 2. Calculate how many servings you have of each food: ? 2 servings rice. ? 1 serving corn. ? 1 serving milk. ? 1 serving strawberries. 3. Multiply each number of servings by 15 g: ? 2 servings rice x 15 g = 30 g. ? 1 serving corn x 15 g = 15 g. ? 1 serving milk x 15 g = 15 g. ? 1 serving strawberries x 15 g = 15 g. 4. Add together all of the amounts to find the total grams of carbohydrates eaten: ? 30 g + 15 g + 15 g + 15 g = 75 g of carbohydrates total. Summary  Carbohydrate counting is a method of keeping track of how many carbohydrates you eat.  Eating carbohydrates naturally increases the amount of sugar (glucose) in  the blood.  Counting how many carbohydrates you eat helps keep your blood glucose within normal limits, which helps you manage your diabetes.  A diet and nutrition specialist (registered dietitian) can help you make a meal plan and calculate how many carbohydrates you should have at each meal and snack. This information is not intended to replace advice given to you by your health care provider. Make sure you discuss any questions you have with your health care provider. Document Revised: 03/25/2017 Document Reviewed: 02/12/2016 Elsevier Patient Education  [place].

## 2020-04-29 NOTE — Chronic Care Management (AMB) (Signed)
Chronic Care Management Pharmacy  Name: Victoria Mcconnell  MRN: [id number] DOB: 1946/01/09   Chief Complaint/ HPI  Victoria Mcconnell,  74 y.o. , female presents for their Initial CCM visit with the clinical pharmacist In office.  PCP : Victoria Rossetti, MD  Their chronic conditions include: HTN, Type II DM, HLD, DDD, Depression/Anxiety.  Office Visits: 04/24/2020 [facility]) - chronic pain  Given Medrol dose pack, avoid NSAIDs due to renal function and HTN  May take APAP  Started on gabapentin 158m hs for nerve pain, Tramadol prn  01/12/2020 ([facility] -   Weight was up 8 lbs  Recent death of her sister who was in hospice  Working out 3 days per week at [facility] No medication changes   Medications: Outpatient Encounter Medications as of 04/29/2020  Medication Sig  . ALPRAZolam (XANAX) 1 MG tablet Take 1 tablet (1 mg total) by mouth 2 (two) times daily as needed.  .[name] KitchenamLODipine (NORVASC) 5 MG tablet TAKE 1 TABLET BY MOUTH  DAILY  . atorvastatin (LIPITOR) 20 MG tablet TAKE 1 TABLET BY MOUTH  DAILY  . Blood Glucose Monitoring Suppl (BLOOD GLUCOSE SYSTEM PAK) KIT Please dispense based on patient and insurance preference. Use as directed to monitor FSBS 1x daily. Dx: E11.9.  . brimonidine-timolol (COMBIGAN) 0.2-0.5 % ophthalmic solution INSTILL 1 DROP INTO BOTH EYES TWICE A DAY  . cholecalciferol (VITAMIN D) 1000 units tablet Take 1 tablet (1,000 Units total) daily by mouth.  . escitalopram (LEXAPRO) 10 MG tablet TAKE 1 TABLET BY MOUTH  DAILY (Patient not taking: Reported on 04/29/2020)  . gabapentin (NEURONTIN) 100 MG capsule Take 1-2 capsules at bedtime  . Glucose Blood (BLOOD GLUCOSE TEST STRIPS) STRP Please dispense based on patient and insurance preference. Use as directed to monitor FSBS 1x daily. Dx: E11.9.  . Lancets MISC Please dispense based on patient and insurance preference. Use as directed to monitor FSBS 1x daily. Dx: E11.9.  . latanoprost (XALATAN) 0.005 % ophthalmic  solution Place 1 drop into both eyes at bedtime.  .[name] Kitchenlinaclotide (LINZESS) 72 MCG capsule Take 1 capsule (72 mcg total) by mouth daily before breakfast.  . montelukast (SINGULAIR) 10 MG tablet TAKE 1 TABLET BY MOUTH AT  BEDTIME  . traMADol (ULTRAM) 50 MG tablet Take 1 tablet (50 mg total) by mouth every 8 (eight) hours as needed for up to 5 days.  . valsartan-hydrochlorothiazide (DIOVAN-HCT) 160-25 MG tablet TAKE 1 TABLET BY MOUTH  DAILY   No facility-administered encounter medications on file as of 04/29/2020.     Current Diagnosis/Assessment:   FEmergency planning/management officerStrain: Low Risk   . Difficulty of Paying Living Expenses: Not very hard    Goals Addressed            This Visit's Progress   . Pharmacy Care Plan:       CARE PLAN ENTRY (see longitudinal plan of care for additional care plan information)  Current Barriers:  . Chronic Disease Management support, education, and care coordination needs related to Hypertension, Hyperlipidemia, Diabetes, and DDD   Hypertension BP Readings from Last 3 Encounters:  04/24/20 136/70  03/22/20 132/78  01/12/20 120/68   . Pharmacist Clinical Goal(s): o Over the next 180 days, patient will work with PharmD and providers to maintain BP goal <140/90 . Current regimen:   Amlodipine 537mdaily  Valsartan/HCTZ 160-2542m Interventions: o Reviewed most recent BP readings o Discussed need for home monitoring occasionally . Patient self care activities - Over  the next 180 days, patient will: o Check BP periodically, document, and provide at future appointments o Ensure daily salt intake < 2300 mg/day o Continue current physical activity level.  Hyperlipidemia Lab Results  Component Value Date/Time   LDLCALC 84 01/12/2020 12:26 PM   . Pharmacist Clinical Goal(s): o Over the next 180 days, patient will work with PharmD and providers to maintain LDL goal < 100 . Current regimen:  o Atorvastatin 54m daily . Interventions: o Discussed  importance of statin medications o Counseled on importance of medication adherence . Patient self care activities - Over the next 180 days, patient will: o Continue to take medication as directed  Diabetes Lab Results  Component Value Date/Time   HGBA1C 6.0 (H) 01/12/2020 12:26 PM   HGBA1C 6.0 (H) 09/13/2019 10:47 AM   . Pharmacist Clinical Goal(s): o Over the next 180 days, patient will work with PharmD and providers to maintain A1c goal <6.5% . Current regimen:  o None . Interventions: o Reviewed home monitoring of blood sugar o Counseled on diabetic dietary modificiations . Patient self care activities - Over the next 180 days, patient will: o Check blood sugar once daily, document, and provide at future appointments o Contact provider with any episodes of hypoglycemia o Work to limit carbohydrate servings to one source per meal  DDD . Pharmacist Clinical Goal(s) o Over the next 180 days, patient will work with PharmD and providers to optimize medication and minimize symptoms related to DDD. . Current regimen:  o Tramadol 568mprn o Gabapentin 10046mne to two capsules at bedtime o Tylenol as needed . Interventions: o Counseled on appropriate use of medications o Discussed continuing water aerobics as low impact exercise . Patient self care activities - Over the next 180 days, patient will: o Continue to take medication as directed. o Contact providers with any sudden change in symptoms.   Initial goal documentation         Diabetes   Recent Relevant Labs: Lab Results  Component Value Date/Time   HGBA1C 6.0 (H) 01/12/2020 12:26 PM   HGBA1C 6.0 (H) 09/13/2019 10:47 AM   MICROALBUR 0.6 01/12/2020 12:26 PM   MICROALBUR 0.3 02/27/2015 10:13 AM     Checking BG: Daily  Recent FBG Readings: 109-120  Patient is currently controlled on the following medications: NONE  Last diabetic Foot exam:  Lab Results  Component Value Date/Time   HMDIABEYEEXA No  Retinopathy 08/14/2019 12:00 AM    Last diabetic Eye exam: No results found for: HMDIABFOOTEX   Patient is doing well monitoring fasting blood sugars daily.  Reports normal readings lately but did not have logs present.  Discussed limiting carbohydrate servings to one source per meal.  Patient seemed interested to try this and continue making dietary modifications to control sugar.  Currently doing water aerobics 3-4 times weekly.  Plan  Continue control with diet and exercise.  Continue to work on diet low in carbohydrates.  Hypertension   Office blood pressures are  BP Readings from Last 3 Encounters:  04/24/20 136/70  03/22/20 132/78  01/12/20 120/68    Patient has failed these meds in the past: none noted  Patient checks BP at home infrequently.  Patient home BP readings are ranging: N/A Patient is currently controlled on the following medications:   Amlodipine 5mg66mily  Valsartan/HCTZ 160-25mg69mtient is adherent to both medications.  Office Bps are controlled, she does have access  To a cuff at home and recommended she monitor  every now and then.  No swelling reported.   Plan  Continue current medications.     Hyperlipidemia   LDL goal < 100  Lipid Panel     Component Value Date/Time   CHOL 166 01/12/2020 1226   TRIG 61 01/12/2020 1226   HDL 68 01/12/2020 1226   LDLCALC 84 01/12/2020 1226    Hepatic Function Latest Ref Rng & Units 01/12/2020 09/13/2019 04/07/2019  Total Protein 6.1 - 8.1 g/dL 7.0 7.3 7.4  Albumin 3.6 - 5.1 g/dL - - -  AST 10 - 35 U/L '17 18 18  ' ALT 6 - 29 U/L '11 13 13  ' Alk Phosphatase 33 - 130 U/L - - -  Total Bilirubin 0.2 - 1.2 mg/dL 0.7 0.5 0.6     The 10-year ASCVD risk score Mikey Bussing DC Jr., et al., 2013) is: 28.4%   Values used to calculate the score:     Age: 51 years     Sex: Female     Is Non-Hispanic African American: Yes     Diabetic: Yes     Tobacco smoker: No     Systolic Blood Pressure: 480 mmHg     Is BP treated:  Yes     HDL Cholesterol: 68 mg/dL     Total Cholesterol: 166 mg/dL   Patient has failed these meds in past: none noted Patient is currently controlled on the following medications:  . Atorvastatin 12m daily  Counseled patient on benefits of statin medications on preventing cardiovascular events, she reports she is taking daily with no adverse effects.  LDL controlled at last lipid panel.  Plan  Continue current medications.  Depression/Anxiety    Patient has failed these meds in past: none noted Patient is currently controlled on the following medications:  Alprazolam 147mbid prn  She stopped taking her Lexapro on her own because she saw no benefit from it.  Reports her nerves have calmed down tremendously after everything in her life has settled down and she no longer feels like she needs it.  Reports a lot of energy to get out and do things the only thing that holds her back is her back pain.  Has not been taking for about a month now and takes Xanax prn which helps.  Plan  Continue current medications, contact providers with any change in symptoms.  DDD   Patient has failed these meds in past: none noted Patient is currently controlled on the following medications:  . Gabapentin 10045m to 2 capsules hs . Tramadol 12m15mn  Patient states her back pain comes and goes.  Gabapentin has helped her sleep at night.  Takes Tramadol prn as directed and this does help take the edge off.  Encouraged her to continue water aerobics as it is low impact.  Has MRI end of the month on back.  Plan  Continue current medications Vaccines   Reviewed and discussed patient's vaccination history.    Immunization History  Administered Date(s) Administered  . Fluad Quad(high Dose 65+) 05/23/2019  . Influenza, High Dose Seasonal PF 07/07/2017  . Influenza,inj,Quad PF,6+ Mos 07/01/2015, 05/18/2018  . Influenza-Unspecified 08/16/2002, 01/20/2014, 07/06/2016  . Moderna SARS-COVID-2  Vaccination 10/22/2019, 11/22/2019  . Pneumococcal Conjugate-13 08/27/2015  . Pneumococcal Polysaccharide-23 09/08/2013, 01/20/2014  . Zoster 09/13/2013    Plan  Recommended patient receive Shingles  vaccine in office.   Medication Management   . Miscellaneous medications: o Combigan Eye drops o Latanoprost 0.005% o Linzess 72 mcg . OTC's:  o APAP o Vitamin D 2000iu . Patient currently uses [facility] mail order. . Patient reports using pill box method to organize medications and promote adherence. . Patient denies missed doses of medication.   Beverly Milch, PharmD Clinical Pharmacist [place] [phone]

## 2020-05-01 DIAGNOSIS — L811 Chloasma: Secondary | ICD-10-CM | POA: Diagnosis not present

## 2020-05-02 ENCOUNTER — Other Ambulatory Visit: Payer: Self-pay | Admitting: *Deleted

## 2020-05-02 DIAGNOSIS — I10 Essential (primary) hypertension: Secondary | ICD-10-CM

## 2020-05-02 DIAGNOSIS — M8589 Other specified disorders of bone density and structure, multiple sites: Secondary | ICD-10-CM

## 2020-05-02 DIAGNOSIS — F411 Generalized anxiety disorder: Secondary | ICD-10-CM

## 2020-05-02 DIAGNOSIS — E7800 Pure hypercholesterolemia, unspecified: Secondary | ICD-10-CM

## 2020-05-02 DIAGNOSIS — F321 Major depressive disorder, single episode, moderate: Secondary | ICD-10-CM

## 2020-05-02 DIAGNOSIS — E119 Type 2 diabetes mellitus without complications: Secondary | ICD-10-CM

## 2020-05-08 ENCOUNTER — Other Ambulatory Visit: Payer: Self-pay | Admitting: Family Medicine

## 2020-05-09 ENCOUNTER — Telehealth: Payer: Self-pay | Admitting: *Deleted

## 2020-05-09 MED ORDER — GABAPENTIN 100 MG PO CAPS: ORAL_CAPSULE | ORAL | 1 refills | Status: DC

## 2020-05-09 NOTE — Telephone Encounter (Signed)
Received fax requesting refill on Gabapentin to mal order.   Prescription sent to pharmacy.

## 2020-05-13 ENCOUNTER — Encounter: Payer: Self-pay | Admitting: Family Medicine

## 2020-05-13 ENCOUNTER — Other Ambulatory Visit: Payer: Self-pay

## 2020-05-13 ENCOUNTER — Ambulatory Visit (INDEPENDENT_AMBULATORY_CARE_PROVIDER_SITE_OTHER): Payer: Medicare Other | Admitting: Family Medicine

## 2020-05-13 VITALS — BP 122/78 | HR 80 | Temp 97.7°F | Resp 14 | Ht 68.0 in | Wt 215.0 lb

## 2020-05-13 DIAGNOSIS — L738 Other specified follicular disorders: Secondary | ICD-10-CM

## 2020-05-13 DIAGNOSIS — I10 Essential (primary) hypertension: Secondary | ICD-10-CM

## 2020-05-13 DIAGNOSIS — E7800 Pure hypercholesterolemia, unspecified: Secondary | ICD-10-CM

## 2020-05-13 DIAGNOSIS — L811 Chloasma: Secondary | ICD-10-CM

## 2020-05-13 DIAGNOSIS — E119 Type 2 diabetes mellitus without complications: Secondary | ICD-10-CM | POA: Diagnosis not present

## 2020-05-13 MED ORDER — TRAMADOL HCL 50 MG PO TABS: 50.0000 mg | ORAL_TABLET | Freq: Three times a day (TID) | ORAL | 0 refills | Status: AC | PRN

## 2020-05-13 NOTE — Assessment & Plan Note (Signed)
Blood pressure is controlled no change in medication. 

## 2020-05-13 NOTE — Progress Notes (Signed)
   Subjective:    Patient ID: Victoria Mcconnell, female    DOB: July 01, 1946, 74 y.o.   MRN: [id number]  Patient presents for Follow-up (is fasting)  Pt here to f/u chronic medica problems  DM- due for repeat labs last A1c was 6% in April Diabetes has been diet controlled.  She is on Lipitor for hyperlipidemia LDL has been at goal less than 100.    She has darkness around her eyes, seen by dermatology, given vitamin C  Dx is Melasma, also had breajout on chin Pseudofolliculitis ( Given doxycycline) Given a topical mixuture of Tretnoin, hydroqunonine, fluconilolde When she went back in April given Tretnoin 0.025% cream Currently Dermatologist- Dr. Mariel Kansky Would like a second opinion.  There are some talk about the mechanical pill but she is concerned about this.  I also discussed possible biopsy but it has not been done yet.  HTN-he is taking blood pressure medicine as prescribed no side effects of medications.  She is scheduled for MRI tomorrow for her back pain, DDD     Review Of Systems:  GEN- denies fatigue, fever, weight loss,weakness, recent illness HEENT- denies eye drainage, change in vision, nasal discharge, CVS- denies chest pain, palpitations RESP- denies SOB, cough, wheeze ABD- denies N/V, change in stools, abd pain GU- denies dysuria, hematuria, dribbling, incontinence MSK-+ joint pain, muscle aches, injury Neuro- denies headache, dizziness, syncope, seizure activity       Objective:    BP 122/78   Pulse 80   Temp 97.7 F (36.5 C) (Temporal)   Resp 14   Ht 5\' 8"  (1.727 m)   Wt 215 lb (97.5 kg)   SpO2 98%   BMI 32.69 kg/m  GEN- NAD, alert and oriented x3 HEENT- PERRL, EOMI, non injected sclera, pink conjunctiva, MMM, oropharynx clear Neck- Supple, no thyromegaly CVS- RRR, no murmur RESP-CTAB ABD-NABS,soft,NT,ND Skin hyperpigmented macules around the eyes and cheeks EXT- No edema Pulses- Radial, DP- 2+        Assessment & Plan:      Problem List  Items Addressed This Visit      Unprioritized   Diabetes mellitus type 2, controlled ([place]) - Primary    Diabetes is diet controlled.  She is on statin drug at goal.      Relevant Orders   CBC with Differential/Platelet   Comprehensive metabolic panel   Hemoglobin A1c   Essential hypertension, benign    Blood pressure is controlled no change in medication.      Relevant Orders   CBC with Differential/Platelet   Hyperlipidemia    Other Visit Diagnoses    Melasma       Last month pseudofolliculitis noted in the chart.  She has tried multiple meds and I am satisfied with the results.  Referral to dermatology for second opinion   Pseudofolliculitis          Note: This dictation was prepared with Dragon dictation along with smaller phrase technology. Any transcriptional errors that result from this process are unintentional.

## 2020-05-13 NOTE — Assessment & Plan Note (Signed)
Diabetes is diet controlled.  She is on statin drug at goal.

## 2020-05-13 NOTE — Patient Instructions (Addendum)
Referral to new dermatologist Continue the gabapentin at bedtime We will call with lab results F/U 4 months For Physical

## 2020-05-14 ENCOUNTER — Ambulatory Visit (HOSPITAL_COMMUNITY)
Admission: RE | Admit: 2020-05-14 | Discharge: 2020-05-14 | Disposition: A | Discharge status: Home / Self Care | Payer: Medicare Other | Source: Ambulatory Visit | Attending: Family Medicine | Admitting: Family Medicine

## 2020-05-14 ENCOUNTER — Ambulatory Visit: Payer: Self-pay | Admitting: Pharmacist

## 2020-05-14 DIAGNOSIS — M5410 Radiculopathy, site unspecified: Secondary | ICD-10-CM | POA: Diagnosis not present

## 2020-05-14 DIAGNOSIS — M5136 Other intervertebral disc degeneration, lumbar region: Secondary | ICD-10-CM | POA: Diagnosis not present

## 2020-05-14 DIAGNOSIS — M4316 Spondylolisthesis, lumbar region: Secondary | ICD-10-CM | POA: Insufficient documentation

## 2020-05-14 DIAGNOSIS — M545 Low back pain: Secondary | ICD-10-CM | POA: Diagnosis not present

## 2020-05-14 LAB — CBC WITH DIFFERENTIAL/PLATELET
Absolute Monocytes: 525 cells/uL (ref 200–950)
Basophils Absolute: 90 cells/uL (ref 0–200)
Basophils Relative: 1.2 %
Eosinophils Absolute: 210 cells/uL (ref 15–500)
Eosinophils Relative: 2.8 %
HCT: 36.2 % (ref 35.0–45.0)
Hemoglobin: 12 g/dL (ref 11.7–15.5)
Lymphs Abs: 1703 cells/uL (ref 850–3900)
MCH: 30.2 pg (ref 27.0–33.0)
MCHC: 33.1 g/dL (ref 32.0–36.0)
MCV: 91.2 fL (ref 80.0–100.0)
MPV: 11.3 fL (ref 7.5–12.5)
Monocytes Relative: 7 %
Neutro Abs: 4973 cells/uL (ref 1500–7800)
Neutrophils Relative %: 66.3 %
Platelets: 237 10*3/uL (ref 140–400)
RBC: 3.97 10*6/uL (ref 3.80–5.10)
RDW: 13.1 % (ref 11.0–15.0)
Total Lymphocyte: 22.7 %
WBC: 7.5 10*3/uL (ref 3.8–10.8)

## 2020-05-14 LAB — COMPREHENSIVE METABOLIC PANEL
AG Ratio: 1.5 (calc) (ref 1.0–2.5)
ALT: 12 U/L (ref 6–29)
AST: 15 U/L (ref 10–35)
Albumin: 4.4 g/dL (ref 3.6–5.1)
Alkaline phosphatase (APISO): 67 U/L (ref 37–153)
BUN/Creatinine Ratio: 15 (calc) (ref 6–22)
BUN: 17 mg/dL (ref 7–25)
CO2: 27 mmol/L (ref 20–32)
Calcium: 10.5 mg/dL — ABNORMAL HIGH (ref 8.6–10.4)
Chloride: 106 mmol/L (ref 98–110)
Creat: 1.17 mg/dL — ABNORMAL HIGH (ref 0.60–0.93)
Globulin: 3 g/dL (calc) (ref 1.9–3.7)
Glucose, Bld: 116 mg/dL — ABNORMAL HIGH (ref 65–99)
Potassium: 3.8 mmol/L (ref 3.5–5.3)
Sodium: 140 mmol/L (ref 135–146)
Total Bilirubin: 0.5 mg/dL (ref 0.2–1.2)
Total Protein: 7.4 g/dL (ref 6.1–8.1)

## 2020-05-14 LAB — HEMOGLOBIN A1C
Hgb A1c MFr Bld: 6.2 % of total Hgb — ABNORMAL HIGH (ref <5.7)
Mean Plasma Glucose: 131 (calc)
eAG (mmol/L): 7.3 (calc)

## 2020-05-14 NOTE — Chronic Care Management (AMB) (Signed)
  Chronic Care Management   Follow Up Note   05/14/2020 Name: Victoria Mcconnell MRN: [id number] DOB: Nov 09, 1945  Referred by: Victoria Rossetti, MD Reason for referral : No chief complaint on file.   SENTA Mcconnell is a 74 y.o. year old female who is a primary care patient of Victoria Mcconnell, Victoria Nunnery, MD. The CCM team was consulted for assistance with chronic disease management and care coordination needs.    Review of patient status, including review of consultants reports, relevant laboratory and other test results, and collaboration with appropriate care team members and the patient's provider was performed as part of comprehensive patient evaluation and provision of chronic care management services.    SDOH (Social Determinants of Health) assessments performed: No See Care Plan activities for detailed interventions related to [facility])     Outpatient Encounter Medications as of 05/14/2020  Medication Sig  . ALPRAZolam (XANAX) 1 MG tablet Take 1 tablet (1 mg total) by mouth 2 (two) times daily as needed.  [name] Kitchen amLODipine (NORVASC) 5 MG tablet TAKE 1 TABLET BY MOUTH  DAILY  . atorvastatin (LIPITOR) 20 MG tablet TAKE 1 TABLET BY MOUTH  DAILY  . Blood Glucose Monitoring Suppl (BLOOD GLUCOSE SYSTEM PAK) KIT Please dispense based on patient and insurance preference. Use as directed to monitor FSBS 1x daily. Dx: E11.9.  . brimonidine-timolol (COMBIGAN) 0.2-0.5 % ophthalmic solution INSTILL 1 DROP INTO BOTH EYES TWICE A DAY  . cholecalciferol (VITAMIN D) 1000 units tablet Take 1 tablet (1,000 Units total) daily by mouth.  . escitalopram (LEXAPRO) 10 MG tablet TAKE 1 TABLET BY MOUTH  DAILY  . gabapentin (NEURONTIN) 100 MG capsule Take 1-2 capsules at bedtime  . Glucose Blood (BLOOD GLUCOSE TEST STRIPS) STRP Please dispense based on patient and insurance preference. Use as directed to monitor FSBS 1x daily. Dx: E11.9.  . Lancets MISC Please dispense based on patient and insurance preference. Use as directed to  monitor FSBS 1x daily. Dx: E11.9.  . latanoprost (XALATAN) 0.005 % ophthalmic solution Place 1 drop into both eyes at bedtime.  [name] Kitchen linaclotide (LINZESS) 72 MCG capsule Take 1 capsule (72 mcg total) by mouth daily before breakfast.  . montelukast (SINGULAIR) 10 MG tablet TAKE 1 TABLET BY MOUTH AT  BEDTIME  . traMADol (ULTRAM) 50 MG tablet Take 1 tablet (50 mg total) by mouth every 8 (eight) hours as needed for up to 5 days.  . valsartan-hydrochlorothiazide (DIOVAN-HCT) 160-25 MG tablet TAKE 1 TABLET BY MOUTH  DAILY   No facility-administered encounter medications on file as of 05/14/2020.     Objective:  Reviewed chart for updated A1c and labs from yesterdays PCP visit.  Goals Addressed   None   A1c 6.2 still at goal, recommend recheck in 4 months.  Continue to work on diet and exercise.  Plan:   The patient has been provided with contact information for the care management team and has been advised to call with any health related questions or concerns.    Victoria Mcconnell, PharmD Clinical Pharmacist Victoria Mcconnell [phone]

## 2020-05-16 ENCOUNTER — Other Ambulatory Visit: Payer: Self-pay | Admitting: *Deleted

## 2020-05-16 ENCOUNTER — Other Ambulatory Visit: Payer: Self-pay

## 2020-05-16 DIAGNOSIS — M5136 Other intervertebral disc degeneration, lumbar region: Secondary | ICD-10-CM

## 2020-05-16 DIAGNOSIS — L738 Other specified follicular disorders: Secondary | ICD-10-CM

## 2020-05-16 DIAGNOSIS — M5441 Lumbago with sciatica, right side: Secondary | ICD-10-CM

## 2020-05-16 DIAGNOSIS — M4800 Spinal stenosis, site unspecified: Secondary | ICD-10-CM

## 2020-05-16 DIAGNOSIS — M4316 Spondylolisthesis, lumbar region: Secondary | ICD-10-CM

## 2020-05-16 DIAGNOSIS — M5410 Radiculopathy, site unspecified: Secondary | ICD-10-CM

## 2020-05-17 ENCOUNTER — Other Ambulatory Visit: Payer: Medicare Other

## 2020-05-17 ENCOUNTER — Other Ambulatory Visit: Payer: Self-pay

## 2020-05-17 LAB — VITAMIN D 25 HYDROXY (VIT D DEFICIENCY, FRACTURES): Vit D, 25-Hydroxy: 26 ng/mL — ABNORMAL LOW (ref 30–100)

## 2020-05-21 LAB — PTH, INTACT AND CALCIUM
Calcium: 9.9 mg/dL (ref 8.6–10.4)
PTH: 62 pg/mL (ref 14–64)

## 2020-05-23 ENCOUNTER — Other Ambulatory Visit: Payer: Self-pay | Admitting: Family Medicine

## 2020-05-29 DIAGNOSIS — M4310 Spondylolisthesis, site unspecified: Secondary | ICD-10-CM | POA: Insufficient documentation

## 2020-05-29 DIAGNOSIS — M48061 Spinal stenosis, lumbar region without neurogenic claudication: Secondary | ICD-10-CM | POA: Insufficient documentation

## 2020-05-29 DIAGNOSIS — M48062 Spinal stenosis, lumbar region with neurogenic claudication: Secondary | ICD-10-CM | POA: Diagnosis not present

## 2020-05-29 DIAGNOSIS — L818 Other specified disorders of pigmentation: Secondary | ICD-10-CM | POA: Diagnosis not present

## 2020-05-29 DIAGNOSIS — L700 Acne vulgaris: Secondary | ICD-10-CM | POA: Diagnosis not present

## 2020-06-11 DIAGNOSIS — H401131 Primary open-angle glaucoma, bilateral, mild stage: Secondary | ICD-10-CM | POA: Diagnosis not present

## 2020-06-15 ENCOUNTER — Other Ambulatory Visit: Payer: Medicare Other

## 2020-06-17 ENCOUNTER — Other Ambulatory Visit: Payer: Medicare Other

## 2020-06-17 DIAGNOSIS — I10 Essential (primary) hypertension: Secondary | ICD-10-CM | POA: Diagnosis not present

## 2020-06-17 DIAGNOSIS — M48062 Spinal stenosis, lumbar region with neurogenic claudication: Secondary | ICD-10-CM | POA: Diagnosis not present

## 2020-06-24 ENCOUNTER — Other Ambulatory Visit (HOSPITAL_COMMUNITY): Payer: Self-pay | Admitting: Family Medicine

## 2020-06-24 DIAGNOSIS — Z1231 Encounter for screening mammogram for malignant neoplasm of breast: Secondary | ICD-10-CM

## 2020-06-26 DIAGNOSIS — M48062 Spinal stenosis, lumbar region with neurogenic claudication: Secondary | ICD-10-CM | POA: Diagnosis not present

## 2020-06-26 DIAGNOSIS — R030 Elevated blood-pressure reading, without diagnosis of hypertension: Secondary | ICD-10-CM | POA: Insufficient documentation

## 2020-06-27 ENCOUNTER — Other Ambulatory Visit: Payer: Self-pay

## 2020-06-27 ENCOUNTER — Ambulatory Visit (INDEPENDENT_AMBULATORY_CARE_PROVIDER_SITE_OTHER): Payer: Medicare Other | Admitting: Family Medicine

## 2020-06-27 DIAGNOSIS — Z23 Encounter for immunization: Secondary | ICD-10-CM

## 2020-07-29 ENCOUNTER — Ambulatory Visit (HOSPITAL_COMMUNITY)
Admission: RE | Admit: 2020-07-29 | Discharge: 2020-07-29 | Disposition: A | Discharge status: Home / Self Care | Payer: Medicare Other | Source: Ambulatory Visit | Attending: Family Medicine | Admitting: Family Medicine

## 2020-07-29 ENCOUNTER — Other Ambulatory Visit: Payer: Self-pay

## 2020-07-29 DIAGNOSIS — Z1231 Encounter for screening mammogram for malignant neoplasm of breast: Secondary | ICD-10-CM | POA: Insufficient documentation

## 2020-08-01 ENCOUNTER — Telehealth: Payer: Self-pay | Admitting: Pharmacist

## 2020-08-01 NOTE — Progress Notes (Signed)
Chronic Care Management Pharmacy Assistant   Name: Victoria Mcconnell  MRN: [id number] DOB: 08/02/1946  Reason for Encounter: Disease State  Patient Questions:  1.  Have you seen any other providers since your last visit? Yes  2.  Any changes in your medicines or health? Yes    PCP : Alycia Rossetti, MD   Their chronic conditions include: HTN, Type II DM, HLD, DDD, Depression/Anxiety   Office Visits: 06-11-2020 (Ophthalmology) Patient presented in the office for Glaucoma Evaluation. Patient was advised to discontinue Latanoprost and Combigan and start Rocklatan one drop in both eyes every night.   Consults: None since their last CCM visit with the clinical pharmacist on 05-14-2020.  Allergies:  No Known Allergies  Medications: Outpatient Encounter Medications as of 08/01/2020  Medication Sig  . ALPRAZolam (XANAX) 1 MG tablet Take 1 tablet (1 mg total) by mouth 2 (two) times daily as needed.  [name] Kitchen amLODipine (NORVASC) 5 MG tablet TAKE 1 TABLET BY MOUTH  DAILY  . atorvastatin (LIPITOR) 20 MG tablet TAKE 1 TABLET BY MOUTH  DAILY  . Blood Glucose Monitoring Suppl (BLOOD GLUCOSE SYSTEM PAK) KIT Please dispense based on patient and insurance preference. Use as directed to monitor FSBS 1x daily. Dx: E11.9.  . brimonidine-timolol (COMBIGAN) 0.2-0.5 % ophthalmic solution INSTILL 1 DROP INTO BOTH EYES TWICE A DAY  . cholecalciferol (VITAMIN D) 1000 units tablet Take 1 tablet (1,000 Units total) daily by mouth.  . escitalopram (LEXAPRO) 10 MG tablet TAKE 1 TABLET BY MOUTH  DAILY  . gabapentin (NEURONTIN) 100 MG capsule TAKE 1-2 CAPSULES BY MOUTH AT BEDTIME  . Glucose Blood (BLOOD GLUCOSE TEST STRIPS) STRP Please dispense based on patient and insurance preference. Use as directed to monitor FSBS 1x daily. Dx: E11.9.  . Lancets MISC Please dispense based on patient and insurance preference. Use as directed to monitor FSBS 1x daily. Dx: E11.9.  . latanoprost (XALATAN) 0.005 % ophthalmic solution  Place 1 drop into both eyes at bedtime.  [name] Kitchen linaclotide (LINZESS) 72 MCG capsule Take 1 capsule (72 mcg total) by mouth daily before breakfast.  . montelukast (SINGULAIR) 10 MG tablet TAKE 1 TABLET BY MOUTH AT  BEDTIME  . valsartan-hydrochlorothiazide (DIOVAN-HCT) 160-25 MG tablet TAKE 1 TABLET BY MOUTH  DAILY   No facility-administered encounter medications on file as of 08/01/2020.    Current Diagnosis: Patient Active Problem List   Diagnosis Date Noted  . DDD (degenerative disc disease), lumbar 03/22/2020  . Osteopenia 08/17/2017  . Constipation 07/07/2017  . GAD (generalized anxiety disorder) 07/07/2017  . Depression, major, single episode, moderate ([place]) 07/01/2015  . Ankle sprain 02/27/2015  . OA (osteoarthritis) 02/27/2015  . Hip bursitis 02/27/2015  . Essential hypertension, benign 01/16/2015  . Diabetes mellitus type 2, controlled ([place]) 01/16/2015  . Hyperlipidemia 01/16/2015  . Left shoulder pain 01/16/2015  . Obesity 01/16/2015  . Glaucoma 01/16/2015    Goals Addressed   None    Contacted the patient for a General Adherence call. Stated she felt good overall. Has started taking her new eye drop prescription. She has been checking her blood pressure when she is symptomatic, but not regularly. Her blood sugars has been running higher than usual lately. Today her it was 115 fasting. Suggested that she try to avoid sugary foods at night. Patient stated she would try to avoid eating right before bed. Patient would like to create a new plan to assist her in lowering her bs readings. Reminded the patient of her  appointment with the clinical pharmacist in February.  Follow-Up:  Pharmacist Review   Fanny Skates, [place] Pharmacist Assistant [phone]

## 2020-08-07 ENCOUNTER — Ambulatory Visit: Payer: Self-pay | Admitting: Pharmacist

## 2020-08-07 DIAGNOSIS — E119 Type 2 diabetes mellitus without complications: Secondary | ICD-10-CM

## 2020-08-07 DIAGNOSIS — I10 Essential (primary) hypertension: Secondary | ICD-10-CM

## 2020-08-07 NOTE — Patient Instructions (Addendum)
Visit Information  Goals Addressed            This Visit's Progress   . COMPLETED: Pharmacy Care Plan:       CARE PLAN ENTRY (see longitudinal plan of care for additional care plan information)  Current Barriers:  . Chronic Disease Management support, education, and care coordination needs related to Hypertension, Hyperlipidemia, Diabetes, and DDD   Hypertension BP Readings from Last 3 Encounters:  04/24/20 136/70  03/22/20 132/78  01/12/20 120/68   . Pharmacist Clinical Goal(s): o Over the next 180 days, patient will work with PharmD and providers to maintain BP goal <140/90 . Current regimen:   Amlodipine 5mg  daily  Valsartan/HCTZ 160-25mg  . Interventions: o Reviewed most recent BP readings o Discussed need for home monitoring occasionally . Patient self care activities - Over the next 180 days, patient will: o Check BP periodically, document, and provide at future appointments o Ensure daily salt intake < 2300 mg/day o Continue current physical activity level.  Hyperlipidemia Lab Results  Component Value Date/Time   LDLCALC 84 01/12/2020 12:26 PM   . Pharmacist Clinical Goal(s): o Over the next 180 days, patient will work with PharmD and providers to maintain LDL goal < 100 . Current regimen:  o Atorvastatin 20mg  daily . Interventions: o Discussed importance of statin medications o Counseled on importance of medication adherence . Patient self care activities - Over the next 180 days, patient will: o Continue to take medication as directed  Diabetes Lab Results  Component Value Date/Time   HGBA1C 6.0 (H) 01/12/2020 12:26 PM   HGBA1C 6.0 (H) 09/13/2019 10:47 AM   . Pharmacist Clinical Goal(s): o Over the next 90 days, patient will work with PharmD and providers to maintain A1c goal <6.5% . Current regimen:  o None . Interventions: o Reviewed home monitoring of blood sugar o Counseled on FBG goal < 130 . Patient self care activities - Over the next 180  days, patient will: o Check blood sugar once daily, document, and provide at future appointments o Contact provider with any episodes of hypoglycemia o Work to limit carbohydrate servings to one source per meal o Contact providers if FBG is consistently > 130.  DDD . Pharmacist Clinical Goal(s) o Over the next 180 days, patient will work with PharmD and providers to optimize medication and minimize symptoms related to DDD. . Current regimen:  o Tramadol 50mg  prn o Gabapentin 100mg  one to two capsules at bedtime o Tylenol as needed . Interventions: o Counseled on appropriate use of medications o Discussed continuing water aerobics as low impact exercise . Patient self care activities - Over the next 180 days, patient will: o Continue to take medication as directed. o Contact providers with any sudden change in symptoms.   Initial goal documentation        The patient verbalized understanding of instructions, educational materials, and care plan provided today and agreed to receive a mailed copy of patient instructions, educational materials, and care plan.   Telephone follow up appointment with pharmacy team member scheduled for: February  Beverly Milch, PharmD Clinical Pharmacist [facility] [phone]   Diabetes Mellitus and Exercise Exercising regularly is important for your overall health, especially when you have diabetes (diabetes mellitus). Exercising is not only about losing weight. It has many other health benefits, such as increasing muscle strength and bone density and reducing body fat and stress. This leads to improved fitness, flexibility, and endurance, all of which result in  better overall health. Exercise has additional benefits for people with diabetes, including:  Reducing appetite.  Helping to lower and control blood glucose.  Lowering blood pressure.  Helping to control amounts of fatty substances (lipids) in the blood, such as  cholesterol and triglycerides.  Helping the body to respond better to insulin (improving insulin sensitivity).  Reducing how much insulin the body needs.  Decreasing the risk for heart disease by: ? Lowering cholesterol and triglyceride levels. ? Increasing the levels of good cholesterol. ? Lowering blood glucose levels. What is my activity plan? Your health care provider or certified diabetes educator can help you make a plan for the type and frequency of exercise (activity plan) that works for you. Make sure that you:  Do at least 150 minutes of moderate-intensity or vigorous-intensity exercise each week. This could be brisk walking, biking, or water aerobics. ? Do stretching and strength exercises, such as yoga or weightlifting, at least 2 times a week. ? Spread out your activity over at least 3 days of the week.  Get some form of physical activity every day. ? Do not go more than 2 days in a row without some kind of physical activity. ? Avoid being inactive for more than 30 minutes at a time. Take frequent breaks to walk or stretch.  Choose a type of exercise or activity that you enjoy, and set realistic goals.  Start slowly, and gradually increase the intensity of your exercise over time. What do I need to know about managing my diabetes?   Check your blood glucose before and after exercising. ? If your blood glucose is 240 mg/dL (13.3 mmol/L) or higher before you exercise, check your urine for ketones. If you have ketones in your urine, do not exercise until your blood glucose returns to normal. ? If your blood glucose is 100 mg/dL (5.6 mmol/L) or lower, eat a snack containing 15-20 grams of carbohydrate. Check your blood glucose 15 minutes after the snack to make sure that your level is above 100 mg/dL (5.6 mmol/L) before you start your exercise.  Know the symptoms of low blood glucose (hypoglycemia) and how to treat it. Your risk for hypoglycemia increases during and after  exercise. Common symptoms of hypoglycemia can include: ? Hunger. ? Anxiety. ? Sweating and feeling clammy. ? Confusion. ? Dizziness or feeling light-headed. ? Increased heart rate or palpitations. ? Blurry vision. ? Tingling or numbness around the mouth, lips, or tongue. ? Tremors or shakes. ? Irritability.  Keep a rapid-acting carbohydrate snack available before, during, and after exercise to help prevent or treat hypoglycemia.  Avoid injecting insulin into areas of the body that are going to be exercised. For example, avoid injecting insulin into: ? The arms, when playing tennis. ? The legs, when jogging.  Keep records of your exercise habits. Doing this can help you and your health care provider adjust your diabetes management plan as needed. Write down: ? Food that you eat before and after you exercise. ? Blood glucose levels before and after you exercise. ? The type and amount of exercise you have done. ? When your insulin is expected to peak, if you use insulin. Avoid exercising at times when your insulin is peaking.  When you start a new exercise or activity, work with your health care provider to make sure the activity is safe for you, and to adjust your insulin, medicines, or food intake as needed.  Drink plenty of water while you exercise to prevent dehydration or heat  stroke. Drink enough fluid to keep your urine clear or pale yellow. Summary  Exercising regularly is important for your overall health, especially when you have diabetes (diabetes mellitus).  Exercising has many health benefits, such as increasing muscle strength and bone density and reducing body fat and stress.  Your health care provider or certified diabetes educator can help you make a plan for the type and frequency of exercise (activity plan) that works for you.  When you start a new exercise or activity, work with your health care provider to make sure the activity is safe for you, and to adjust your  insulin, medicines, or food intake as needed. This information is not intended to replace advice given to you by your health care provider. Make sure you discuss any questions you have with your health care provider. Document Revised: 03/25/2017 Document Reviewed: 02/10/2016 Elsevier Patient Education  [place].

## 2020-08-07 NOTE — Chronic Care Management (AMB) (Signed)
Chronic Care Management   Follow Up Note   08/07/2020 Name: Victoria Mcconnell MRN: [id number] DOB: Feb 11, 1946  Referred by: Alycia Rossetti, MD Reason for referral : Chronic Care Management (f/u on CPA call)   Victoria Mcconnell is a 74 y.o. year old female who is a primary care patient of [place], [place] Nunnery, MD. The CCM team was consulted for assistance with chronic disease management and care coordination needs.    Review of patient status, including review of consultants reports, relevant laboratory and other test results, and collaboration with appropriate care team members and the patient's provider was performed as part of comprehensive patient evaluation and provision of chronic care management services.     SDOH (Social Determinants of Health) assessments performed: No See Care Plan activities for detailed interventions related to [facility])     Outpatient Encounter Medications as of 08/07/2020  Medication Sig   ALPRAZolam (XANAX) 1 MG tablet Take 1 tablet (1 mg total) by mouth 2 (two) times daily as needed.   amLODipine (NORVASC) 5 MG tablet TAKE 1 TABLET BY MOUTH  DAILY   atorvastatin (LIPITOR) 20 MG tablet TAKE 1 TABLET BY MOUTH  DAILY   Blood Glucose Monitoring Suppl (BLOOD GLUCOSE SYSTEM PAK) KIT Please dispense based on patient and insurance preference. Use as directed to monitor FSBS 1x daily. Dx: E11.9.   brimonidine-timolol (COMBIGAN) 0.2-0.5 % ophthalmic solution INSTILL 1 DROP INTO BOTH EYES TWICE A DAY   cholecalciferol (VITAMIN D) 1000 units tablet Take 1 tablet (1,000 Units total) daily by mouth.   escitalopram (LEXAPRO) 10 MG tablet TAKE 1 TABLET BY MOUTH  DAILY   gabapentin (NEURONTIN) 100 MG capsule TAKE 1-2 CAPSULES BY MOUTH AT BEDTIME   Glucose Blood (BLOOD GLUCOSE TEST STRIPS) STRP Please dispense based on patient and insurance preference. Use as directed to monitor FSBS 1x daily. Dx: E11.9.   Lancets MISC Please dispense based on patient and insurance  preference. Use as directed to monitor FSBS 1x daily. Dx: E11.9.   latanoprost (XALATAN) 0.005 % ophthalmic solution Place 1 drop into both eyes at bedtime.   linaclotide (LINZESS) 72 MCG capsule Take 1 capsule (72 mcg total) by mouth daily before breakfast.   montelukast (SINGULAIR) 10 MG tablet TAKE 1 TABLET BY MOUTH AT  BEDTIME   valsartan-hydrochlorothiazide (DIOVAN-HCT) 160-25 MG tablet TAKE 1 TABLET BY MOUTH  DAILY   No facility-administered encounter medications on file as of 08/07/2020.    Per CPA note, patient wanted to discuss a new plan for her blood sugars since a lot of her readings were higher than she was used to.   Goals Addressed            This Visit's Progress    COMPLETED: Pharmacy Care Plan:       CARE PLAN ENTRY (see longitudinal plan of care for additional care plan information)  Current Barriers:   Chronic Disease Management support, education, and care coordination needs related to Hypertension, Hyperlipidemia, Diabetes, and DDD   Hypertension BP Readings from Last 3 Encounters:  04/24/20 136/70  03/22/20 132/78  01/12/20 120/68    Pharmacist Clinical Goal(s): o Over the next 180 days, patient will work with PharmD and providers to maintain BP goal <140/90  Current regimen:   Amlodipine 21m daily  Valsartan/HCTZ 160-222m Interventions: o Reviewed most recent BP readings o Discussed need for home monitoring occasionally  Patient self care activities - Over the next 180 days, patient will: o Check BP periodically, document, and  provide at future appointments o Ensure daily salt intake < 2300 mg/day o Continue current physical activity level.  Hyperlipidemia Lab Results  Component Value Date/Time   [facility] 84 01/12/2020 12:26 PM    Pharmacist Clinical Goal(s): o Over the next 180 days, patient will work with PharmD and providers to maintain LDL goal < 100  Current regimen:  o Atorvastatin 15m daily  Interventions: o Discussed  importance of statin medications o Counseled on importance of medication adherence  Patient self care activities - Over the next 180 days, patient will: o Continue to take medication as directed  Diabetes Lab Results  Component Value Date/Time   HGBA1C 6.0 (H) 01/12/2020 12:26 PM   HGBA1C 6.0 (H) 09/13/2019 10:47 AM    Pharmacist Clinical Goal(s): o Over the next 90 days, patient will work with PharmD and providers to maintain A1c goal <6.5%  Current regimen:  o None  Interventions: o Reviewed home monitoring of blood sugar o Counseled on FBG goal < 130  Patient self care activities - Over the next 180 days, patient will: o Check blood sugar once daily, document, and provide at future appointments o Contact provider with any episodes of hypoglycemia o Work to limit carbohydrate servings to one source per meal o Contact providers if FBG is consistently > 130.  DDD  Pharmacist Clinical Goal(s) o Over the next 180 days, patient will work with PharmD and providers to optimize medication and minimize symptoms related to DDD.  Current regimen:  o Tramadol 571mprn o Gabapentin 10037mne to two capsules at bedtime o Tylenol as needed  Interventions: o Counseled on appropriate use of medications o Discussed continuing water aerobics as low impact exercise  Patient self care activities - Over the next 180 days, patient will: o Continue to take medication as directed. o Contact providers with any sudden change in symptoms.   Initial goal documentation        There are no care plans to display for this patient.  Diabetes   A1c goal <6.5%  Recent Relevant Labs: Lab Results  Component Value Date/Time   HGBA1C 6.2 (H) 05/13/2020 12:04 PM   HGBA1C 6.0 (H) 01/12/2020 12:26 PM   MICROALBUR 0.6 01/12/2020 12:26 PM   MICROALBUR 0.3 02/27/2015 10:13 AM    Last diabetic Eye exam:  Lab Results  Component Value Date/Time   HMDIABEYEEXA No Retinopathy 08/14/2019 12:00 AM     Last diabetic Foot exam: No results found for: HMDIABFOOTEX   Checking BG: Daily  Recent FBG Readings: 123, 111, 123, 119, 115  Patient has failed these meds in past: none noted Patient is currently controlled on the following medications:  None  We discussed:   Counseled patient on her fasting blood sugar goal of < 130.  Patient was alarmed because she is used to seeing 90s and low 100s in the morning, so she thought she was on the verge of starting medication again.  Her preference is to treat her diabetes without medication.  Encouraged patient to increase physical activity, which she admitted has decreased since her back pain.   Recommended elliptical to minimize impact on her body.  Patient agreeable to plan  Plan  Continue control with diet and exercise  Contact providers if FBG is > 130 consistently [facility] toler[facility] Hypertension   BP goal is:  <130/80  Office blood pressures are  BP Readings from Last 3 Encounters:  05/13/20 122/78  04/24/20 136/70  03/22/20 132/78  Patient checks BP at home infrequently Patient home BP readings are ranging: no logs  Patient has failed these meds in the past: none noted Patient is currently controlled on the following medications:   Valsartan-HCTZ 160-38m  Amlodipine 515m We discussed   Exercise will also help keep BP controlled.  Discussed that she still has access to a cuff.  Denies dizziness, headaches  Continues to report adherence  Plan  Continue current medications    Follow up:   Patient has f/u appt with PharmD in February.   ChBeverly MilchPharmD Clinical Pharmacist [phone]

## 2020-08-29 ENCOUNTER — Other Ambulatory Visit: Payer: Self-pay | Admitting: Family Medicine

## 2020-09-16 ENCOUNTER — Encounter: Payer: Medicare Other | Admitting: Family Medicine

## 2020-09-18 DIAGNOSIS — M48062 Spinal stenosis, lumbar region with neurogenic claudication: Secondary | ICD-10-CM | POA: Diagnosis not present

## 2020-09-18 DIAGNOSIS — R030 Elevated blood-pressure reading, without diagnosis of hypertension: Secondary | ICD-10-CM | POA: Diagnosis not present

## 2020-09-25 ENCOUNTER — Encounter: Payer: Medicare Other | Admitting: Family Medicine

## 2020-09-25 ENCOUNTER — Other Ambulatory Visit: Payer: Self-pay | Admitting: Family Medicine

## 2020-10-08 ENCOUNTER — Other Ambulatory Visit: Payer: Self-pay

## 2020-10-08 ENCOUNTER — Other Ambulatory Visit: Payer: Medicare Other

## 2020-10-08 DIAGNOSIS — Z20822 Contact with and (suspected) exposure to covid-19: Secondary | ICD-10-CM | POA: Diagnosis not present

## 2020-10-09 LAB — NOVEL CORONAVIRUS, NAA: SARS-CoV-2, NAA: NOT DETECTED

## 2020-10-09 LAB — SARS-COV-2, NAA 2 DAY TAT

## 2020-10-16 ENCOUNTER — Other Ambulatory Visit: Payer: Self-pay

## 2020-10-16 ENCOUNTER — Encounter: Payer: Self-pay | Admitting: Family Medicine

## 2020-10-16 ENCOUNTER — Ambulatory Visit (INDEPENDENT_AMBULATORY_CARE_PROVIDER_SITE_OTHER): Payer: Medicare Other | Admitting: Family Medicine

## 2020-10-16 VITALS — BP 124/76 | HR 91 | Temp 97.6°F | Resp 16 | Ht 68.0 in | Wt 221.0 lb

## 2020-10-16 DIAGNOSIS — M159 Polyosteoarthritis, unspecified: Secondary | ICD-10-CM

## 2020-10-16 DIAGNOSIS — Z0001 Encounter for general adult medical examination with abnormal findings: Secondary | ICD-10-CM | POA: Diagnosis not present

## 2020-10-16 DIAGNOSIS — H409 Unspecified glaucoma: Secondary | ICD-10-CM

## 2020-10-16 DIAGNOSIS — M8589 Other specified disorders of bone density and structure, multiple sites: Secondary | ICD-10-CM

## 2020-10-16 DIAGNOSIS — M8949 Other hypertrophic osteoarthropathy, multiple sites: Secondary | ICD-10-CM

## 2020-10-16 DIAGNOSIS — M5136 Other intervertebral disc degeneration, lumbar region: Secondary | ICD-10-CM

## 2020-10-16 DIAGNOSIS — E7800 Pure hypercholesterolemia, unspecified: Secondary | ICD-10-CM

## 2020-10-16 DIAGNOSIS — E559 Vitamin D deficiency, unspecified: Secondary | ICD-10-CM | POA: Diagnosis not present

## 2020-10-16 DIAGNOSIS — E119 Type 2 diabetes mellitus without complications: Secondary | ICD-10-CM

## 2020-10-16 DIAGNOSIS — I10 Essential (primary) hypertension: Secondary | ICD-10-CM | POA: Diagnosis not present

## 2020-10-16 DIAGNOSIS — F411 Generalized anxiety disorder: Secondary | ICD-10-CM

## 2020-10-16 DIAGNOSIS — Z0000 Encounter for general adult medical examination without abnormal findings: Secondary | ICD-10-CM

## 2020-10-16 MED ORDER — ATORVASTATIN CALCIUM 20 MG PO TABS: 20.0000 mg | ORAL_TABLET | Freq: Every day | ORAL | 1 refills | Status: DC

## 2020-10-16 MED ORDER — VITAMIN D 50 MCG (2000 UT) PO TABS: 2000.0000 [IU] | ORAL_TABLET | Freq: Every day | ORAL | Status: DC

## 2020-10-16 MED ORDER — AMLODIPINE BESYLATE 5 MG PO TABS: 5.0000 mg | ORAL_TABLET | Freq: Every day | ORAL | 1 refills | Status: DC

## 2020-10-16 MED ORDER — VALSARTAN-HYDROCHLOROTHIAZIDE 160-25 MG PO TABS: 1.0000 | ORAL_TABLET | Freq: Every day | ORAL | 1 refills | Status: DC

## 2020-10-16 MED ORDER — GABAPENTIN 100 MG PO CAPS: ORAL_CAPSULE | ORAL | 1 refills | Status: DC

## 2020-10-16 MED ORDER — ESCITALOPRAM OXALATE 10 MG PO TABS: 10.0000 mg | ORAL_TABLET | Freq: Every day | ORAL | 1 refills | Status: DC

## 2020-10-16 NOTE — Patient Instructions (Signed)
You can use nasal and flonase

## 2020-10-16 NOTE — Progress Notes (Signed)
Subjective:   Patient presents for Medicare Annual/Subsequent preventive examination.   Pt here to f/u chronic medica problems   DM- due for repeat labs last A1c was 6.2% in August, she has 2 meters and the readings are off. She has one touch- cbg this AM 152   Hyperlipidemia-  She is on Lipitor for hyperlipidemia LDL has been at goal less than 100.  Chronic back pain- she was seen by neurosurgery had injection in her spine but that has not helped, has pain with walking and daily activites  not able to exericse, she is taking tylenol, gabapentin  She does occ take aleve   she has appt Feb 23rd with Dr. Trenton Gammon  HTN- taking norvasc   GAD she is taking her lexapro and xanax prn, she still gets worried about every little thing but feels better on the medicine   Ostopenia- taking vitamin D, had elevated calcium this resolved after stopping supplement and PTH was normal in Sept Bone density UTD     Dr. Orlene Och dermatologist, has her on doxycyline 50mg  once a day      She has darkness around her eyes, seen by dermatology, given vitamin C  Dx is Melasma, also had breajout on chin Pseudofolliculitis ( Given doxycycline) Given a topical mixuture of Tretnoin, hydroqunonine, fluconilolde When she went back in April given Tretnoin 0.025% cream Currently Dermatologist- Dr. Mariel Kansky Would like a second opinion.  There are some talk about the mechanical pill but she is concerned about this.  I also discussed possible biopsy but it has not been done yet.  HTN-he is taking blood pressure medicine as prescribed no side effects of medications.      Review Past Medical/Family/Social: Per EMR   Risk Factors  Current exercise habits: none Dietary issues discussed: YES  Cardiac risk factors: dm, htn,hld  Depression Screen  (Note: if answer to either of the following is "Yes", a more complete depression screening is indicated)  Over the past two weeks, have you felt down, depressed  or hopeless? No Over the past two weeks, have you felt little interest or pleasure in doing things? No Have you lost interest or pleasure in daily life? No Do you often feel hopeless? No Do you cry easily over simple problems? No   Activities of Daily Living  In your present state of health, do you have any difficulty performing the following activities?:  Driving? No  Managing money? No  Feeding yourself? No  Getting from bed to chair? No  Climbing a flight of stairs? YES  Preparing food and eating?: No  Bathing or showering? No  Getting dressed: No  Getting to the toilet? No  Using the toilet:No  Moving around from place to place: No  In the past year have you fallen or had a near fall?:YES    Hearing Difficulties: No  Do you often ask people to speak up or repeat themselves? No  Do you experience ringing or noises in your ears? No Do you have difficulty understanding soft or whispered voices? No  Do you feel that you have a problem with memory? No Do you often misplace items? No  Do you feel safe at home? Yes  Cognitive Testing  Alert? Yes Normal Appearance?Yes  Oriented to person? Yes Place? Yes  Time? Yes  Recall of three objects? Yes  Can perform simple calculations? Yes  Displays appropriate judgment?Yes  Can read the correct time from a watch face?Yes   List the Names of  Other Physician/Practitioners you currently use:   Dr. Mickey Farber. Trenton Gammon Neurosugery   Dr. Mariel Kansky Dermatology  Dr. Richardean Sale Ophthalmologist   Screening Tests / Date  Colonoscopy     utd                Zostavax utd Mammogram utd Influenza Vaccine utd Tetanus/tdap DECLINES DUE To cost PNA UTD COVID-19 UTD ROS: GEN- denies fatigue, fever, weight loss,weakness, recent illness HEENT- denies eye drainage, change in vision, nasal discharge, CVS- denies chest pain, palpitations RESP- denies SOB, cough, wheeze ABD- denies N/V, change in stools, abd pain GU- denies dysuria, hematuria,  dribbling, incontinence MSK- denies+pain, muscle aches, injury Neuro- denies headache, dizziness, syncope, seizure activity  PHYSICAL: GEN- NAD, alert and oriented x3 HEENT- PERRL, EOMI, non injected sclera, pink conjunctiva, MMM, oropharynx clear Neck- Supple, no thryomegaly, no bruit  CVS- RRR, no murmur RESP-CTAB abd-nabs,SOFT,nt,nd Psych- normal affect and mood EXT- No edema Pulses- Radial, DP- 2+   Assessment:    Annual wellness medicare exam   Plan:    During the course of the visit the patient was educated and counseled about appropriate screening and preventive services including:   CPE done  GAD- depression screen neg, continue lexapro, prn xanax  DM- recheck A1C, CBG have been elevated recently, but also had epidural injections, goal less than 7%  HTn controlled no changes  DDD/OA - f/u neurosurgery , continue gabapentin   Glaucoma per ophthalmology  Osteopenia- continue vitamin D, recheck calcium level  higher fall risk due to OA/   For alleries/ can use anti-histamine and nasal saline/flonase     FULL CODE   Diet review for nutrition referral? Yes ____ Not Indicated __x__  Patient Instructions (the written plan) was given to the patient.  Medicare Attestation  I have personally reviewed:  The patient's medical and social history  Their use of alcohol, tobacco or illicit drugs  Their current medications and supplements  The patient's functional ability including ADLs,fall risks, home safety risks, cognitive, and hearing and visual impairment  Diet and physical activities  Evidence for depression or mood disorders  The patient's weight, height, BMI, and visual acuity have been recorded in the chart. I have made referrals, counseling, and provided education to the patient based on review of the above and I have provided the patient with a written personalized care plan for preventive services.

## 2020-10-17 LAB — COMPREHENSIVE METABOLIC PANEL
AG Ratio: 1.6 (calc) (ref 1.0–2.5)
ALT: 23 U/L (ref 6–29)
AST: 20 U/L (ref 10–35)
Albumin: 4.4 g/dL (ref 3.6–5.1)
Alkaline phosphatase (APISO): 81 U/L (ref 37–153)
BUN/Creatinine Ratio: 21 (calc) (ref 6–22)
BUN: 22 mg/dL (ref 7–25)
CO2: 25 mmol/L (ref 20–32)
Calcium: 10.4 mg/dL (ref 8.6–10.4)
Chloride: 106 mmol/L (ref 98–110)
Creat: 1.03 mg/dL — ABNORMAL HIGH (ref 0.60–0.93)
Globulin: 2.8 g/dL (calc) (ref 1.9–3.7)
Glucose, Bld: 103 mg/dL — ABNORMAL HIGH (ref 65–99)
Potassium: 4.1 mmol/L (ref 3.5–5.3)
Sodium: 142 mmol/L (ref 135–146)
Total Bilirubin: 0.5 mg/dL (ref 0.2–1.2)
Total Protein: 7.2 g/dL (ref 6.1–8.1)

## 2020-10-17 LAB — LIPID PANEL
Cholesterol: 161 mg/dL (ref <200)
HDL: 69 mg/dL (ref >=50)
LDL Cholesterol (Calc): 79 mg/dL (calc)
Non-HDL Cholesterol (Calc): 92 mg/dL (calc) (ref <130)
Total CHOL/HDL Ratio: 2.3 (calc) (ref <5.0)
Triglycerides: 57 mg/dL (ref <150)

## 2020-10-17 LAB — CBC WITH DIFFERENTIAL/PLATELET
Absolute Monocytes: 705 cells/uL (ref 200–950)
Basophils Absolute: 60 cells/uL (ref 0–200)
Basophils Relative: 0.7 %
Eosinophils Absolute: 258 cells/uL (ref 15–500)
Eosinophils Relative: 3 %
HCT: 36.1 % (ref 35.0–45.0)
Hemoglobin: 11.9 g/dL (ref 11.7–15.5)
Lymphs Abs: 1789 cells/uL (ref 850–3900)
MCH: 29.9 pg (ref 27.0–33.0)
MCHC: 33 g/dL (ref 32.0–36.0)
MCV: 90.7 fL (ref 80.0–100.0)
MPV: 11 fL (ref 7.5–12.5)
Monocytes Relative: 8.2 %
Neutro Abs: 5788 cells/uL (ref 1500–7800)
Neutrophils Relative %: 67.3 %
Platelets: 245 10*3/uL (ref 140–400)
RBC: 3.98 10*6/uL (ref 3.80–5.10)
RDW: 13.1 % (ref 11.0–15.0)
Total Lymphocyte: 20.8 %
WBC: 8.6 10*3/uL (ref 3.8–10.8)

## 2020-10-17 LAB — HEMOGLOBIN A1C
Hgb A1c MFr Bld: 6.4 % of total Hgb — ABNORMAL HIGH (ref <5.7)
Mean Plasma Glucose: 137 mg/dL
eAG (mmol/L): 7.6 mmol/L

## 2020-10-17 LAB — VITAMIN D 25 HYDROXY (VIT D DEFICIENCY, FRACTURES): Vit D, 25-Hydroxy: 32 ng/mL (ref 30–100)

## 2020-10-21 DIAGNOSIS — H524 Presbyopia: Secondary | ICD-10-CM | POA: Diagnosis not present

## 2020-10-21 DIAGNOSIS — H5213 Myopia, bilateral: Secondary | ICD-10-CM | POA: Diagnosis not present

## 2020-10-21 DIAGNOSIS — H401131 Primary open-angle glaucoma, bilateral, mild stage: Secondary | ICD-10-CM | POA: Diagnosis not present

## 2020-10-21 DIAGNOSIS — H52203 Unspecified astigmatism, bilateral: Secondary | ICD-10-CM | POA: Diagnosis not present

## 2020-10-21 DIAGNOSIS — H2513 Age-related nuclear cataract, bilateral: Secondary | ICD-10-CM | POA: Diagnosis not present

## 2020-10-21 DIAGNOSIS — E1136 Type 2 diabetes mellitus with diabetic cataract: Secondary | ICD-10-CM | POA: Diagnosis not present

## 2020-10-22 ENCOUNTER — Encounter: Payer: Self-pay | Admitting: Family Medicine

## 2020-10-22 ENCOUNTER — Other Ambulatory Visit: Payer: Self-pay

## 2020-10-22 ENCOUNTER — Telehealth (INDEPENDENT_AMBULATORY_CARE_PROVIDER_SITE_OTHER): Payer: Medicare Other | Admitting: Family Medicine

## 2020-10-22 VITALS — BP 124/76 | Ht 68.0 in | Wt 221.0 lb

## 2020-10-22 DIAGNOSIS — F411 Generalized anxiety disorder: Secondary | ICD-10-CM | POA: Diagnosis not present

## 2020-10-22 DIAGNOSIS — I10 Essential (primary) hypertension: Secondary | ICD-10-CM | POA: Diagnosis not present

## 2020-10-22 DIAGNOSIS — E785 Hyperlipidemia, unspecified: Secondary | ICD-10-CM

## 2020-10-22 DIAGNOSIS — E119 Type 2 diabetes mellitus without complications: Secondary | ICD-10-CM | POA: Diagnosis not present

## 2020-10-22 DIAGNOSIS — Z7689 Persons encountering health services in other specified circumstances: Secondary | ICD-10-CM | POA: Diagnosis not present

## 2020-10-22 MED ORDER — ASPIRIN EC 81 MG PO TBEC
81.0000 mg | DELAYED_RELEASE_TABLET | Freq: Every day | ORAL | 3 refills | Status: DC
Start: 2020-10-22 — End: 2021-07-09

## 2020-10-22 NOTE — Patient Instructions (Addendum)
F/U end August , call if you need me sooner   Please start coated  aspirin 81 mg once daily for heart protection since ou are diabetic  You may check blood sugar 3 times  Weekly, you are well controlled on diet only  Microalb end April  Please check your pharmacy and get if able shingrix vaccines for shingles , this is a 2 part series   Fasting lipid, cmp and EGFR , HBA1C, TSH 5 days before next visit  Hope that your back pain and arthritis will improve, you willl need to keep an open mind to what will work to help you, including the possibility of surgery  It is important that you exercise regularly at least 30 minutes 5 times a week. If you develop chest pain, have severe difficulty breathing, or feel very tired, stop exercising immediately and seek medical attention  Think about what you will eat, plan ahead. Choose " clean, green, fresh or frozen" over canned, processed or packaged foods which are more sugary, salty and fatty. 70 to 75% of food eaten should be vegetables and fruit. Three meals at set times with snacks allowed between meals, but they must be fruit or vegetables. Aim to eat over a 12 hour period , example 7 am to 7 pm, and STOP after  your last meal of the day. Drink water,generally about 64 ounces per day, no other drink is as healthy. Fruit juice is best enjoyed in a healthy way, by EATING the fruit. Thanks for choosing [facility], we consider it a privelige to serve you.

## 2020-10-22 NOTE — Progress Notes (Signed)
Virtual Visit via Telephone Note  I connected with KIERRIA FEIGENBAUM on 10/22/20 at  3:40 PM EST by telephone and verified that I am speaking with the correct person using two identifiers.  Location: Patient: home Provider: office   I discussed the limitations, risks, security and privacy concerns of performing an evaluation and management service by telephone and the availability of in person appointments. I also discussed with the patient that there may be a patient responsible charge related to this service. The patient expressed understanding and agreed to proceed.   History of Present Illness: Establish care  Only current concern is back pain from arthritis and disc disease, has upcoming appointment with Neurosurgeon as conservative measures have failed Medical and surgical , also social history are reviewed during the visit Medications reviewed and  Adjusted as deemed necessary Immunization and cancer screening are up to date Recent general medical exam by previous PCP is reviewed for most recent vitals and concerns   Observations/Objective: BP 124/76   Ht 5\' 8"  (1.727 m)   Wt 221 lb (100.2 kg)   BMI 33.60 kg/m  Good communication with no confusion and intact memory. Alert and oriented x 3 No signs of respiratory distress during speech    Assessment and Plan:  Establishing care with new doctor, encounter for Visit as documented, records reviewed and problems and medicains also allergies are verified. New is aspirin 81 mg daily Xanax is removed due to non use  Essential hypertension, benign Controlled, no change in medication DASH diet and commitment to daily physical activity for a minimum of 30 minutes discussed and encouraged, as a part of hypertension management. The importance of attaining a healthy weight is also discussed.  BP/Weight 10/22/2020 10/16/2020 05/13/2020 04/24/2020 03/22/2020 01/12/2020 32/99/2426  Systolic BP 834 196 222 979 892 119 417  Diastolic BP 76 76 78  70 78 68 64  Wt. (Lbs) 221 221 215 216 218 212 204  BMI 33.6 33.6 32.69 32.84 33.15 32.23 31.02       Diabetes mellitus type 2, controlled ([place]) Ms. Vendetti is reminded of the importance of commitment to daily physical activity for 30 minutes or more, as able and the need to limit carbohydrate intake to 30 to 60 grams per meal to help with blood sugar control.    Ms. Brienza is reminded of the importance of daily foot exam, annual eye examination, and good blood sugar, blood pressure and cholesterol control.  Diabetic Labs Latest Ref Rng & Units 10/16/2020 05/13/2020 01/12/2020 09/13/2019 04/07/2019  HbA1c <5.7 % of total Hgb 6.4(H) 6.2(H) 6.0(H) 6.0(H) 6.1(H)  Microalbumin mg/dL - - 0.6 - -  Micro/Creat Ratio <30 mcg/mg creat - - 8 - -  Chol <200 mg/dL 161 - 166 166 152  HDL > OR = 50 mg/dL 69 - 68 74 60  Calc LDL mg/dL (calc) 79 - 84 78 78  Triglycerides <150 mg/dL 57 - 61 62 59  Creatinine 0.60 - 0.93 mg/dL 1.03(H) 1.17(H) 1.24(H) 1.13(H) 1.18(H)   BP/Weight 10/22/2020 10/16/2020 05/13/2020 04/24/2020 03/22/2020 01/12/2020 40/81/4481  Systolic BP 856 314 970 263 785 885 027  Diastolic BP 76 76 78 70 78 68 64  Wt. (Lbs) 221 221 215 216 218 212 204  BMI 33.6 33.6 32.69 32.84 33.15 32.23 31.02   Foot/eye exam completion dates Latest Ref Rng & Units 09/13/2019 08/14/2019  Eye Exam No Retinopathy - No Retinopathy  Foot Form Completion - Done -        GAD (  generalized anxiety disorder) Controlled, no change in medication   Hyperlipidemia Hyperlipidemia:Low fat diet discussed and encouraged.   Lipid Panel  Lab Results  Component Value Date   CHOL 161 10/16/2020   HDL 69 10/16/2020   LDLCALC 79 10/16/2020   TRIG 57 10/16/2020   CHOLHDL 2.3 10/16/2020   Controlled, no change in medication    Glaucoma Followed closely and treated by Opthalmology  Obesity  Patient re-educated about  the importance of commitment to a  minimum of 150 minutes of exercise per week as able.  The  importance of healthy food choices with portion control discussed, as well as eating regularly and within a 12 hour window most days. The need to choose "clean , green" food 50 to 75% of the time is discussed, as well as to make water the primary drink and set a goal of 64 ounces water daily.    Weight /BMI 10/22/2020 10/16/2020 05/13/2020  WEIGHT 221 lb 221 lb 215 lb  HEIGHT 5\' 8"  5\' 8"  5\' 8"   BMI 33.6 kg/m2 33.6 kg/m2 32.69 kg/m2       Follow Up Instructions:    I discussed the assessment and treatment plan with the patient. The patient was provided an opportunity to ask questions and all were answered. The patient agreed with the plan and demonstrated an understanding of the instructions.   The patient was advised to call back or seek an in-person evaluation if the symptoms worsen or if the condition fails to improve as anticipated.  I provided 24 minutes of non-face-to-face time during this encounter.   Tula Nakayama, MD

## 2020-10-26 ENCOUNTER — Encounter: Payer: Self-pay | Admitting: Family Medicine

## 2020-10-26 DIAGNOSIS — Z7689 Persons encountering health services in other specified circumstances: Secondary | ICD-10-CM | POA: Insufficient documentation

## 2020-10-26 DIAGNOSIS — Z1211 Encounter for screening for malignant neoplasm of colon: Secondary | ICD-10-CM | POA: Insufficient documentation

## 2020-10-26 NOTE — Assessment & Plan Note (Signed)
Controlled, no change in medication  

## 2020-10-26 NOTE — Assessment & Plan Note (Signed)
Hyperlipidemia:Low fat diet discussed and encouraged.   Lipid Panel  Lab Results  Component Value Date   CHOL 161 10/16/2020   HDL 69 10/16/2020   LDLCALC 79 10/16/2020   TRIG 57 10/16/2020   CHOLHDL 2.3 10/16/2020   Controlled, no change in medication    

## 2020-10-26 NOTE — Assessment & Plan Note (Signed)
Visit as documented, records reviewed and problems and medicains also allergies are verified. New is aspirin 81 mg daily Xanax is removed due to non use

## 2020-10-26 NOTE — Assessment & Plan Note (Signed)
Followed closely and treated by Opthalmology

## 2020-10-26 NOTE — Assessment & Plan Note (Signed)
Controlled, no change in medication DASH diet and commitment to daily physical activity for a minimum of 30 minutes discussed and encouraged, as a part of hypertension management. The importance of attaining a healthy weight is also discussed.  BP/Weight 10/22/2020 10/16/2020 05/13/2020 04/24/2020 03/22/2020 01/12/2020 75/17/0017  Systolic BP 494 496 759 163 846 659 935  Diastolic BP 76 76 78 70 78 68 64  Wt. (Lbs) 221 221 215 216 218 212 204  BMI 33.6 33.6 32.69 32.84 33.15 32.23 31.02

## 2020-10-26 NOTE — Assessment & Plan Note (Signed)
  Patient re-educated about  the importance of commitment to a  minimum of 150 minutes of exercise per week as able.  The importance of healthy food choices with portion control discussed, as well as eating regularly and within a 12 hour window most days. The need to choose "clean , green" food 50 to 75% of the time is discussed, as well as to make water the primary drink and set a goal of 64 ounces water daily.    Weight /BMI 10/22/2020 10/16/2020 05/13/2020  WEIGHT 221 lb 221 lb 215 lb  HEIGHT 5\' 8"  5\' 8"  5\' 8"   BMI 33.6 kg/m2 33.6 kg/m2 32.69 kg/m2

## 2020-10-26 NOTE — Assessment & Plan Note (Signed)
Victoria Mcconnell is reminded of the importance of commitment to daily physical activity for 30 minutes or more, as able and the need to limit carbohydrate intake to 30 to 60 grams per meal to help with blood sugar control.    Victoria Mcconnell is reminded of the importance of daily foot exam, annual eye examination, and good blood sugar, blood pressure and cholesterol control.  Diabetic Labs Latest Ref Rng & Units 10/16/2020 05/13/2020 01/12/2020 09/13/2019 04/07/2019  HbA1c <5.7 % of total Hgb 6.4(H) 6.2(H) 6.0(H) 6.0(H) 6.1(H)  Microalbumin mg/dL - - 0.6 - -  Micro/Creat Ratio <30 mcg/mg creat - - 8 - -  Chol <200 mg/dL 161 - 166 166 152  HDL > OR = 50 mg/dL 69 - 68 74 60  Calc LDL mg/dL (calc) 79 - 84 78 78  Triglycerides <150 mg/dL 57 - 61 62 59  Creatinine 0.60 - 0.93 mg/dL 1.03(H) 1.17(H) 1.24(H) 1.13(H) 1.18(H)   BP/Weight 10/22/2020 10/16/2020 05/13/2020 04/24/2020 03/22/2020 01/12/2020 89/78/4784  Systolic BP 128 208 138 871 959 747 185  Diastolic BP 76 76 78 70 78 68 64  Wt. (Lbs) 221 221 215 216 218 212 204  BMI 33.6 33.6 32.69 32.84 33.15 32.23 31.02   Foot/eye exam completion dates Latest Ref Rng & Units 09/13/2019 08/14/2019  Eye Exam No Retinopathy - No Retinopathy  Foot Form Completion - Done -

## 2020-10-29 DIAGNOSIS — M48062 Spinal stenosis, lumbar region with neurogenic claudication: Secondary | ICD-10-CM | POA: Diagnosis not present

## 2020-11-05 ENCOUNTER — Telehealth: Payer: Self-pay

## 2020-11-11 DIAGNOSIS — M48061 Spinal stenosis, lumbar region without neurogenic claudication: Secondary | ICD-10-CM | POA: Diagnosis not present

## 2020-11-11 DIAGNOSIS — M48062 Spinal stenosis, lumbar region with neurogenic claudication: Secondary | ICD-10-CM | POA: Diagnosis not present

## 2020-11-11 DIAGNOSIS — M4316 Spondylolisthesis, lumbar region: Secondary | ICD-10-CM | POA: Diagnosis not present

## 2020-11-11 DIAGNOSIS — M5136 Other intervertebral disc degeneration, lumbar region: Secondary | ICD-10-CM | POA: Diagnosis not present

## 2020-11-11 HISTORY — PX: SPINE SURGERY: SHX786

## 2020-11-15 ENCOUNTER — Other Ambulatory Visit: Payer: Self-pay | Admitting: Family Medicine

## 2020-12-18 ENCOUNTER — Other Ambulatory Visit: Payer: Self-pay | Admitting: Family Medicine

## 2021-02-01 ENCOUNTER — Other Ambulatory Visit: Payer: Self-pay | Admitting: Family Medicine

## 2021-02-06 ENCOUNTER — Other Ambulatory Visit: Payer: Self-pay

## 2021-02-06 ENCOUNTER — Telehealth: Payer: Self-pay

## 2021-02-06 ENCOUNTER — Encounter: Payer: Self-pay | Admitting: Family Medicine

## 2021-02-06 MED ORDER — AMLODIPINE BESYLATE 5 MG PO TABS: 5.0000 mg | ORAL_TABLET | Freq: Every day | ORAL | 1 refills | Status: DC

## 2021-02-06 MED ORDER — VALSARTAN-HYDROCHLOROTHIAZIDE 160-25 MG PO TABS: 1.0000 | ORAL_TABLET | Freq: Every day | ORAL | 1 refills | Status: DC

## 2021-02-06 MED ORDER — ESCITALOPRAM OXALATE 10 MG PO TABS: 10.0000 mg | ORAL_TABLET | Freq: Every day | ORAL | 1 refills | Status: DC

## 2021-02-06 MED ORDER — LINACLOTIDE 72 MCG PO CAPS: 72.0000 ug | ORAL_CAPSULE | Freq: Every day | ORAL | 1 refills | Status: DC

## 2021-02-06 MED ORDER — ATORVASTATIN CALCIUM 20 MG PO TABS: 20.0000 mg | ORAL_TABLET | Freq: Every day | ORAL | 1 refills | Status: DC

## 2021-02-06 MED ORDER — MONTELUKAST SODIUM 10 MG PO TABS: 1.0000 | ORAL_TABLET | Freq: Every day | ORAL | 1 refills | Status: DC

## 2021-02-06 MED ORDER — GABAPENTIN 100 MG PO CAPS: ORAL_CAPSULE | ORAL | 1 refills | Status: DC

## 2021-02-06 NOTE — Telephone Encounter (Signed)
Meds [facility]illed [facility]

## 2021-02-06 NOTE — Telephone Encounter (Signed)
Patient called need med refills  amLODipine (NORVASC) 5 MG tablet  linaclotide (LINZESS) 72 MCG capsule  SINGULAIR) 10 MG tablet atorvastatin (LIPITOR) 20 MG tablet valsartan-hydrochlorothiazide (DIOVAN-HCT) 160-25 MG gabapentin (NEURONTIN) 100 MG capsule escitalopram (LEXAPRO) 10 MG tablet    Pharmacy: [facility]

## 2021-02-07 ENCOUNTER — Telehealth: Payer: Self-pay | Admitting: Family Medicine

## 2021-02-07 NOTE — Telephone Encounter (Signed)
pls change her in office follow up from end August to June,, she is new to me and I have not seen  Her in the office , please let her know that I am requesting sooner appt for this reason, thanks

## 2021-02-12 ENCOUNTER — Other Ambulatory Visit: Payer: Self-pay

## 2021-02-12 ENCOUNTER — Telehealth: Payer: Self-pay

## 2021-02-12 MED ORDER — ACCU-CHEK AVIVA PLUS VI STRP: ORAL_STRIP | 3 refills | Status: DC

## 2021-02-12 NOTE — Telephone Encounter (Signed)
Called- no answer, no voicemail

## 2021-02-12 NOTE — Telephone Encounter (Signed)
Patient called need test strips she prefers the accu chek test strips instead of the [facility] ULTRA test strip   Pharmacy: [facility]

## 2021-02-12 NOTE — Telephone Encounter (Signed)
Rx sent 

## 2021-02-20 ENCOUNTER — Ambulatory Visit (INDEPENDENT_AMBULATORY_CARE_PROVIDER_SITE_OTHER): Payer: Medicare Other | Admitting: Family Medicine

## 2021-02-20 ENCOUNTER — Other Ambulatory Visit (HOSPITAL_COMMUNITY): Payer: Self-pay | Admitting: Family Medicine

## 2021-02-20 ENCOUNTER — Other Ambulatory Visit: Payer: Self-pay | Admitting: *Deleted

## 2021-02-20 ENCOUNTER — Encounter: Payer: Self-pay | Admitting: Family Medicine

## 2021-02-20 ENCOUNTER — Other Ambulatory Visit: Payer: Self-pay

## 2021-02-20 VITALS — BP 126/77 | HR 87 | Temp 99.1°F | Resp 18 | Ht 68.0 in | Wt 217.1 lb

## 2021-02-20 DIAGNOSIS — K5901 Slow transit constipation: Secondary | ICD-10-CM | POA: Diagnosis not present

## 2021-02-20 DIAGNOSIS — Z6833 Body mass index (BMI) 33.0-33.9, adult: Secondary | ICD-10-CM

## 2021-02-20 DIAGNOSIS — M48061 Spinal stenosis, lumbar region without neurogenic claudication: Secondary | ICD-10-CM | POA: Diagnosis not present

## 2021-02-20 DIAGNOSIS — Z800 Family history of malignant neoplasm of digestive organs: Secondary | ICD-10-CM | POA: Insufficient documentation

## 2021-02-20 DIAGNOSIS — Z1231 Encounter for screening mammogram for malignant neoplasm of breast: Secondary | ICD-10-CM

## 2021-02-20 DIAGNOSIS — R7303 Prediabetes: Secondary | ICD-10-CM | POA: Diagnosis not present

## 2021-02-20 DIAGNOSIS — Z860100 Personal history of colon polyps, unspecified: Secondary | ICD-10-CM | POA: Insufficient documentation

## 2021-02-20 DIAGNOSIS — Z23 Encounter for immunization: Secondary | ICD-10-CM

## 2021-02-20 DIAGNOSIS — I10 Essential (primary) hypertension: Secondary | ICD-10-CM

## 2021-02-20 DIAGNOSIS — H401131 Primary open-angle glaucoma, bilateral, mild stage: Secondary | ICD-10-CM | POA: Diagnosis not present

## 2021-02-20 DIAGNOSIS — K5904 Chronic idiopathic constipation: Secondary | ICD-10-CM | POA: Insufficient documentation

## 2021-02-20 DIAGNOSIS — R195 Other fecal abnormalities: Secondary | ICD-10-CM | POA: Insufficient documentation

## 2021-02-20 LAB — POCT GLYCOSYLATED HEMOGLOBIN (HGB A1C)
HbA1c, POC (controlled diabetic range): 5.9 % (ref 0.0–7.0)
HbA1c, POC (prediabetic range): 5.9 % (ref 5.7–6.4)

## 2021-02-20 MED ORDER — ONETOUCH ULTRA VI STRP: ORAL_STRIP | 3 refills | Status: DC

## 2021-02-20 NOTE — Patient Instructions (Signed)
F/u in office with mD in 4 months, call if you need me sooner  Pneumonia 23 today  GlycoHB today  Script for 90 day supply for once daily test strips will  be sent in by Nurse    Mammogram to be scheduled at checkout at [facility]    Nurse visit for flu vaccine for over 37, end August or early Septemeber  Please bring documentation of your covid booster and shingrix vaccines tomorrow as discussed, nurse to update your record  You are being referred to phamacy/ [facility] for assistance with  obtainig/ affording linzess which you need  It is important that you exercise regularly at least 30 minutes 5 times a week. If you develop chest pain, have severe difficulty breathing, or feel very tired, stop exercising immediately and seek medical attention  Thanks for choosing [facility], we consider it a privelige to serve you.

## 2021-02-21 ENCOUNTER — Encounter: Payer: Self-pay | Admitting: Family Medicine

## 2021-02-21 DIAGNOSIS — Z23 Encounter for immunization: Secondary | ICD-10-CM | POA: Insufficient documentation

## 2021-02-21 NOTE — Assessment & Plan Note (Signed)
Responds to linzess which she has difficulty affording pharm D to assist through [facility]

## 2021-02-21 NOTE — Assessment & Plan Note (Signed)
After obtaining informed consent, the vaccine is  administered , with no adverse effect noted at the time of administration.  

## 2021-02-21 NOTE — Assessment & Plan Note (Signed)
Patient educated about the importance of limiting  Carbohydrate intake , the need to commit to daily physical activity for a minimum of 30 minutes , and to commit weight loss. The fact that changes in all these areas will reduce or eliminate all together the development of diabetes is stressed.   Diabetic Labs Latest Ref Rng & Units 02/20/2021 02/20/2021 10/16/2020 05/13/2020 01/12/2020  HbA1c 0.0 - 7.0 % 5.9 5.9 6.4(H) 6.2(H) 6.0(H)  Microalbumin mg/dL - - - - 0.6  Micro/Creat Ratio <30 mcg/mg creat - - - - 8  Chol <200 mg/dL - - 161 - 166  HDL > OR = 50 mg/dL - - 69 - 68  Calc LDL mg/dL (calc) - - 79 - 84  Triglycerides <150 mg/dL - - 57 - 61  Creatinine 0.60 - 0.93 mg/dL - - 1.03(H) 1.17(H) 1.24(H)   BP/Weight 02/20/2021 10/22/2020 10/16/2020 05/13/2020 04/24/2020 03/22/2020 3/66/4403  Systolic BP 474 259 563 875 643 329 518  Diastolic BP 77 76 76 78 70 78 68  Wt. (Lbs) 217.12 221 221 215 216 218 212  BMI 33.01 33.6 33.6 32.69 32.84 33.15 32.23   Foot/eye exam completion dates Latest Ref Rng & Units 09/13/2019 08/14/2019  Eye Exam No Retinopathy - No Retinopathy  Foot Form Completion - Done -

## 2021-02-21 NOTE — Assessment & Plan Note (Signed)
Marked improvement in pain following lumbar spine surgery

## 2021-02-21 NOTE — Assessment & Plan Note (Signed)
  Patient re-educated about  the importance of commitment to a  minimum of 150 minutes of exercise per week as able.  The importance of healthy food choices with portion control discussed, as well as eating regularly and within a 12 hour window most days. The need to choose "clean , green" food 50 to 75% of the time is discussed, as well as to make water the primary drink and set a goal of 64 ounces water daily.    Weight /BMI 02/20/2021 10/22/2020 10/16/2020  WEIGHT 217 lb 1.9 oz 221 lb 221 lb  HEIGHT 5\' 8"  5\' 8"  5\' 8"   BMI 33.01 kg/m2 33.6 kg/m2 33.6 kg/m2

## 2021-02-21 NOTE — Progress Notes (Signed)
[facility] HOLLEN     MRN: [id number]      DOB: July 25, 1946   HPI Victoria Mcconnell is here for follow up and re-evaluation of chronic medical conditions, medication management and review of any available recent lab and radiology data.  Preventive health is updated, specifically  Cancer screening and Immunization.   Very happy with back surgery which she recently had , denies lower extremity pain, has a little LBP The PT denies any adverse reactions to current medications since the last visit.  C/o inability to afford linzess which she needs, states it is $1000/ month ROS Denies recent fever or chills. Denies sinus pressure, nasal congestion, ear pain or sore throat. Denies chest congestion, productive cough or wheezing. Denies chest pains, palpitations and leg swelling Denies abdominal pain, nausea, vomiting,diarrhea or constipation.   Denies dysuria, frequency, hesitancy or incontinence. Denies headaches, seizures, numbness, or tingling. Denies depression, anxiety or insomnia. Denies skin break down or rash.   PE  BP 126/77 (BP Location: Right Arm, Patient Position: Sitting, Cuff Size: Normal)   Pulse 87   Temp 99.1 F (37.3 C) (Oral)   Resp 18   Ht 5\' 8"  (1.727 m)   Wt 217 lb 1.9 oz (98.5 kg)   SpO2 98%   BMI 33.01 kg/m   Patient alert and oriented and in no cardiopulmonary distress.  HEENT: No facial asymmetry, EOMI,     Neck supple .  Chest: Clear to auscultation bilaterally.  CVS: S1, S2 no murmurs, no S3.Regular rate.  ABD: Soft non tender.   Ext: No edema  MS: Adequate though reduced ROM ROM lumbar spine, normal in shoulders, hips and knees.  Skin: Intact, no ulcerations or rash noted.  Psych: Good eye contact, normal affect. Memory intact not anxious or depressed appearing.  CNS: CN 2-12 intact, power,  normal throughout.no focal deficits noted.   Assessment & Plan  Need for 23-polyvalent pneumococcal polysaccharide vaccine After obtaining informed consent,  the vaccine is  administered , with no adverse effect noted at the time of administration.   Essential (primary) hypertension Controlled, no change in medication DASH diet and commitment to daily physical activity for a minimum of 30 minutes discussed and encouraged, as a part of hypertension management. The importance of attaining a healthy weight is also discussed.  BP/Weight 02/20/2021 10/22/2020 10/16/2020 05/13/2020 04/24/2020 03/22/2020 7/82/4235  Systolic BP 361 443 154 008 676 195 093  Diastolic BP 77 76 76 78 70 78 68  Wt. (Lbs) 217.12 221 221 215 216 218 212  BMI 33.01 33.6 33.6 32.69 32.84 33.15 32.23       Constipation Responds to linzess which she has difficulty affording pharm D to assist through [facility]  Body mass index (BMI) 33.0-33.9, adult  Patient re-educated about  the importance of commitment to a  minimum of 150 minutes of exercise per week as able.  The importance of healthy food choices with portion control discussed, as well as eating regularly and within a 12 hour window most days. The need to choose "clean , green" food 50 to 75% of the time is discussed, as well as to make water the primary drink and set a goal of 64 ounces water daily.    Weight /BMI 02/20/2021 10/22/2020 10/16/2020  WEIGHT 217 lb 1.9 oz 221 lb 221 lb  HEIGHT 5\' 8"  5\' 8"  5\' 8"   BMI 33.01 kg/m2 33.6 kg/m2 33.6 kg/m2      Prediabetes Patient educated about the importance of limiting  Carbohydrate intake , the need to commit to daily physical activity for a minimum of 30 minutes , and to commit weight loss. The fact that changes in all these areas will reduce or eliminate all together the development of diabetes is stressed.   Diabetic Labs Latest Ref Rng & Units 02/20/2021 02/20/2021 10/16/2020 05/13/2020 01/12/2020  HbA1c 0.0 - 7.0 % 5.9 5.9 6.4(H) 6.2(H) 6.0(H)  Microalbumin mg/dL - - - - 0.6  Micro/Creat Ratio <30 mcg/mg creat - - - - 8  Chol <200 mg/dL - - 161 - 166  HDL > OR = 50 mg/dL - - 69 - 68   Calc LDL mg/dL (calc) - - 79 - 84  Triglycerides <150 mg/dL - - 57 - 61  Creatinine 0.60 - 0.93 mg/dL - - 1.03(H) 1.17(H) 1.24(H)   BP/Weight 02/20/2021 10/22/2020 10/16/2020 05/13/2020 04/24/2020 03/22/2020 10/31/4713  Systolic BP 953 967 289 791 504 136 438  Diastolic BP 77 76 76 78 70 78 68  Wt. (Lbs) 217.12 221 221 215 216 218 212  BMI 33.01 33.6 33.6 32.69 32.84 33.15 32.23   Foot/eye exam completion dates Latest Ref Rng & Units 09/13/2019 08/14/2019  Eye Exam No Retinopathy - No Retinopathy  Foot Form Completion - Done -      Spinal stenosis of lumbar region Marked improvement in pain following lumbar spine surgery

## 2021-02-21 NOTE — Assessment & Plan Note (Signed)
Controlled, no change in medication DASH diet and commitment to daily physical activity for a minimum of 30 minutes discussed and encouraged, as a part of hypertension management. The importance of attaining a healthy weight is also discussed.  BP/Weight 02/20/2021 10/22/2020 10/16/2020 05/13/2020 04/24/2020 03/22/2020 8/48/3507  Systolic BP 573 225 672 091 980 221 798  Diastolic BP 77 76 76 78 70 78 68  Wt. (Lbs) 217.12 221 221 215 216 218 212  BMI 33.01 33.6 33.6 32.69 32.84 33.15 32.23

## 2021-03-06 DIAGNOSIS — Z1211 Encounter for screening for malignant neoplasm of colon: Secondary | ICD-10-CM | POA: Diagnosis not present

## 2021-03-06 DIAGNOSIS — Z800 Family history of malignant neoplasm of digestive organs: Secondary | ICD-10-CM | POA: Diagnosis not present

## 2021-03-06 DIAGNOSIS — Z86010 Personal history of colonic polyps: Secondary | ICD-10-CM | POA: Diagnosis not present

## 2021-03-06 DIAGNOSIS — K5904 Chronic idiopathic constipation: Secondary | ICD-10-CM | POA: Diagnosis not present

## 2021-03-27 ENCOUNTER — Other Ambulatory Visit: Payer: Self-pay

## 2021-03-27 ENCOUNTER — Encounter: Payer: Self-pay | Admitting: Family Medicine

## 2021-03-27 ENCOUNTER — Ambulatory Visit (HOSPITAL_COMMUNITY)
Admission: RE | Admit: 2021-03-27 | Discharge: 2021-03-27 | Disposition: A | Discharge status: Home / Self Care | Payer: Medicare Other | Source: Ambulatory Visit | Attending: Family Medicine | Admitting: Family Medicine

## 2021-03-27 ENCOUNTER — Ambulatory Visit (INDEPENDENT_AMBULATORY_CARE_PROVIDER_SITE_OTHER): Payer: Medicare Other | Admitting: Family Medicine

## 2021-03-27 VITALS — BP 122/77 | HR 82 | Temp 98.1°F | Resp 20 | Ht 68.0 in | Wt 218.0 lb

## 2021-03-27 DIAGNOSIS — R7303 Prediabetes: Secondary | ICD-10-CM

## 2021-03-27 DIAGNOSIS — M25551 Pain in right hip: Secondary | ICD-10-CM | POA: Diagnosis not present

## 2021-03-27 DIAGNOSIS — I10 Essential (primary) hypertension: Secondary | ICD-10-CM | POA: Diagnosis not present

## 2021-03-27 DIAGNOSIS — E669 Obesity, unspecified: Secondary | ICD-10-CM

## 2021-03-27 DIAGNOSIS — F411 Generalized anxiety disorder: Secondary | ICD-10-CM

## 2021-03-27 DIAGNOSIS — E785 Hyperlipidemia, unspecified: Secondary | ICD-10-CM | POA: Diagnosis not present

## 2021-03-27 NOTE — Patient Instructions (Addendum)
F/u as before , call if you need me sooner  X ray of right hip today    Chem 7 and EGFr today  I believe your pain is from muscle spasm/ use, I recommend tylenol arthritis 1 to 2  one the days that you need it. Preferably use muscle rub for pain  Thanks for choosing [facility], we consider it a privelige to serve you.

## 2021-03-28 LAB — BMP8+EGFR
BUN/Creatinine Ratio: 13 (ref 12–28)
BUN: 14 mg/dL (ref 8–27)
CO2: 23 mmol/L (ref 20–29)
Calcium: 10.2 mg/dL (ref 8.7–10.3)
Chloride: 106 mmol/L (ref 96–106)
Creatinine, Ser: 1.05 mg/dL — ABNORMAL HIGH (ref 0.57–1.00)
Glucose: 112 mg/dL — ABNORMAL HIGH (ref 65–99)
Potassium: 3.8 mmol/L (ref 3.5–5.2)
Sodium: 143 mmol/L (ref 134–144)
eGFR: 56 mL/min/{1.73_m2} — ABNORMAL LOW (ref >59)

## 2021-03-29 ENCOUNTER — Encounter: Payer: Self-pay | Admitting: Family Medicine

## 2021-03-29 DIAGNOSIS — E669 Obesity, unspecified: Secondary | ICD-10-CM | POA: Insufficient documentation

## 2021-03-29 NOTE — Progress Notes (Signed)
Victoria Mcconnell     MRN: [id number]      DOB: 02-16-1946   HPI Victoria Mcconnell is here with a 2 week h/o right hip pain, she had slipped and 5 months ao , and has ahd back surgery since that time Also states she heard from anoher Provider that kidney function was compromised and she has questions  ROS Denies recent fever or chills. Denies sinus pressure, nasal congestion, ear pain or sore throat. Denies chest congestion, productive cough or wheezing. Denies chest pains, palpitations and leg swelling Denies abdominal pain, nausea, vomiting,diarrhea or constipation.   Denies dysuria, frequency, hesitancy or incontinence.  Denies headaches, seizures, numbness, or tingling.  Denies skin break down or rash.   PE  BP 122/77 (BP Location: Right Arm, Patient Position: Sitting, Cuff Size: Large)   Pulse 82   Temp 98.1 F (36.7 C)   Resp 20   Ht 5\' 8"  (1.727 m)   Wt 218 lb (98.9 kg)   SpO2 95%   BMI 33.15 kg/m   Patient alert and oriented and in no cardiopulmonary distress.  HEENT: No facial asymmetry, EOMI,     Neck supple .  Chest: Clear to auscultation bilaterally.  CVS: S1, S2 no murmurs, no S3.Regular rate.  ABD: Soft non tender.   Ext: No edema  MS: Adequate ROM spine, shoulders, hips and knees.  Skin: Intact, no ulcerations or rash noted.  Psych: Good eye contact, normal affect. Memory intact mildly  anxious not  depressed appearing.  CNS: CN 2-12 intact, power,  normal throughout.no focal deficits noted.   Assessment & Plan Right hip pain 3 week h/o , no recent trauma, x ray today, low suspicion for significant arthritis  Essential (primary) hypertension Controlled, no change in medication DASH diet and commitment to daily physical activity for a minimum of 30 minutes discussed and encouraged, as a part of hypertension management. The importance of attaining a healthy weight is also discussed.  BP/Weight 03/27/2021 02/20/2021 10/22/2020 10/16/2020 05/13/2020 04/24/2020  11/17/4654  Systolic BP 812 751 700 174 944 967 591  Diastolic BP 77 77 76 76 78 70 78  Wt. (Lbs) 218 217.12 221 221 215 216 218  BMI 33.15 33.01 33.6 33.6 32.69 32.84 33.15       GAD (generalized anxiety disorder) Increased anxiety about health and in light of upcoming trip, pt ventilated for 10 minutes, no indication  That she needs therapy, continue current med  Hyperlipidemia Hyperlipidemia:Low fat diet discussed and encouraged.   Lipid Panel  Lab Results  Component Value Date   CHOL 161 10/16/2020   HDL 69 10/16/2020   LDLCALC 79 10/16/2020   TRIG 57 10/16/2020   CHOLHDL 2.3 10/16/2020   Controlled, no change in medication     Prediabetes Patient educated about the importance of limiting  Carbohydrate intake , the need to commit to daily physical activity for a minimum of 30 minutes , and to commit weight loss. The fact that changes in all these areas will reduce or eliminate all together the development of diabetes is stressed.   Diabetic Labs Latest Ref Rng & Units 03/27/2021 02/20/2021 02/20/2021 10/16/2020 05/13/2020  HbA1c 0.0 - 7.0 % - 5.9 5.9 6.4(H) 6.2(H)  Microalbumin mg/dL - - - - -  Micro/Creat Ratio <30 mcg/mg creat - - - - -  Chol <200 mg/dL - - - 161 -  HDL > OR = 50 mg/dL - - - 69 -  Calc LDL mg/dL (calc) - - -  79 -  Triglycerides <150 mg/dL - - - 57 -  Creatinine 0.57 - 1.00 mg/dL 1.05(H) - - 1.03(H) 1.17(H)   BP/Weight 03/27/2021 02/20/2021 10/22/2020 10/16/2020 05/13/2020 4/88/8916 05/18/5037  Systolic BP 882 800 349 179 150 569 794  Diastolic BP 77 77 76 76 78 70 78  Wt. (Lbs) 218 217.12 221 221 215 216 218  BMI 33.15 33.01 33.6 33.6 32.69 32.84 33.15   Foot/eye exam completion dates Latest Ref Rng & Units 09/13/2019 08/14/2019  Eye Exam No Retinopathy - No Retinopathy  Foot Form Completion - Done -      Obesity (BMI 30.0-34.9)  Patient re-educated about  the importance of commitment to a  minimum of 150 minutes of exercise per week as able.  The  importance of healthy food choices with portion control discussed, as well as eating regularly and within a 12 hour window most days. The need to choose "clean , green" food 50 to 75% of the time is discussed, as well as to make water the primary drink and set a goal of 64 ounces water daily.    Weight /BMI 03/27/2021 02/20/2021 10/22/2020  WEIGHT 218 lb 217 lb 1.9 oz 221 lb  HEIGHT 5\' 8"  5\' 8"  5\' 8"   BMI 33.15 kg/m2 33.01 kg/m2 33.6 kg/m2

## 2021-03-29 NOTE — Assessment & Plan Note (Signed)
Controlled, no change in medication DASH diet and commitment to daily physical activity for a minimum of 30 minutes discussed and encouraged, as a part of hypertension management. The importance of attaining a healthy weight is also discussed.  BP/Weight 03/27/2021 02/20/2021 10/22/2020 10/16/2020 05/13/2020 3/74/4514 6/0/4799  Systolic BP 872 158 727 618 485 927 639  Diastolic BP 77 77 76 76 78 70 78  Wt. (Lbs) 218 217.12 221 221 215 216 218  BMI 33.15 33.01 33.6 33.6 32.69 32.84 33.15

## 2021-03-29 NOTE — Assessment & Plan Note (Signed)
3 week h/o , no recent trauma, x ray today, low suspicion for significant arthritis

## 2021-03-29 NOTE — Assessment & Plan Note (Signed)
Hyperlipidemia:Low fat diet discussed and encouraged.   Lipid Panel  Lab Results  Component Value Date   CHOL 161 10/16/2020   HDL 69 10/16/2020   LDLCALC 79 10/16/2020   TRIG 57 10/16/2020   CHOLHDL 2.3 10/16/2020   Controlled, no change in medication

## 2021-03-29 NOTE — Assessment & Plan Note (Signed)
  Patient re-educated about  the importance of commitment to a  minimum of 150 minutes of exercise per week as able.  The importance of healthy food choices with portion control discussed, as well as eating regularly and within a 12 hour window most days. The need to choose "clean , green" food 50 to 75% of the time is discussed, as well as to make water the primary drink and set a goal of 64 ounces water daily.    Weight /BMI 03/27/2021 02/20/2021 10/22/2020  WEIGHT 218 lb 217 lb 1.9 oz 221 lb  HEIGHT 5\' 8"  5\' 8"  5\' 8"   BMI 33.15 kg/m2 33.01 kg/m2 33.6 kg/m2

## 2021-03-29 NOTE — Assessment & Plan Note (Signed)
Increased anxiety about health and in light of upcoming trip, pt ventilated for 10 minutes, no indication  That she needs therapy, continue current med

## 2021-03-29 NOTE — Assessment & Plan Note (Signed)
Patient educated about the importance of limiting  Carbohydrate intake , the need to commit to daily physical activity for a minimum of 30 minutes , and to commit weight loss. The fact that changes in all these areas will reduce or eliminate all together the development of diabetes is stressed.   Diabetic Labs Latest Ref Rng & Units 03/27/2021 02/20/2021 02/20/2021 10/16/2020 05/13/2020  HbA1c 0.0 - 7.0 % - 5.9 5.9 6.4(H) 6.2(H)  Microalbumin mg/dL - - - - -  Micro/Creat Ratio <30 mcg/mg creat - - - - -  Chol <200 mg/dL - - - 161 -  HDL > OR = 50 mg/dL - - - 69 -  Calc LDL mg/dL (calc) - - - 79 -  Triglycerides <150 mg/dL - - - 57 -  Creatinine 0.57 - 1.00 mg/dL 1.05(H) - - 1.03(H) 1.17(H)   BP/Weight 03/27/2021 02/20/2021 10/22/2020 10/16/2020 05/13/2020 03/20/8674 12/17/9199  Systolic BP 007 121 975 883 254 982 641  Diastolic BP 77 77 76 76 78 70 78  Wt. (Lbs) 218 217.12 221 221 215 216 218  BMI 33.15 33.01 33.6 33.6 32.69 32.84 33.15   Foot/eye exam completion dates Latest Ref Rng & Units 09/13/2019 08/14/2019  Eye Exam No Retinopathy - No Retinopathy  Foot Form Completion - Done -

## 2021-05-05 DIAGNOSIS — K6389 Other specified diseases of intestine: Secondary | ICD-10-CM | POA: Diagnosis not present

## 2021-05-05 DIAGNOSIS — K635 Polyp of colon: Secondary | ICD-10-CM | POA: Diagnosis not present

## 2021-05-05 DIAGNOSIS — Z1211 Encounter for screening for malignant neoplasm of colon: Secondary | ICD-10-CM | POA: Diagnosis not present

## 2021-05-05 DIAGNOSIS — Z86010 Personal history of colonic polyps: Secondary | ICD-10-CM | POA: Diagnosis not present

## 2021-05-05 DIAGNOSIS — Z800 Family history of malignant neoplasm of digestive organs: Secondary | ICD-10-CM | POA: Diagnosis not present

## 2021-05-05 DIAGNOSIS — D122 Benign neoplasm of ascending colon: Secondary | ICD-10-CM | POA: Diagnosis not present

## 2021-05-06 ENCOUNTER — Ambulatory Visit: Payer: Medicare Other | Admitting: Family Medicine

## 2021-07-08 DIAGNOSIS — H401131 Primary open-angle glaucoma, bilateral, mild stage: Secondary | ICD-10-CM | POA: Diagnosis not present

## 2021-07-09 ENCOUNTER — Ambulatory Visit (INDEPENDENT_AMBULATORY_CARE_PROVIDER_SITE_OTHER): Payer: Medicare Other | Admitting: Family Medicine

## 2021-07-09 ENCOUNTER — Other Ambulatory Visit: Payer: Self-pay

## 2021-07-09 ENCOUNTER — Encounter: Payer: Self-pay | Admitting: Family Medicine

## 2021-07-09 VITALS — BP 123/77 | HR 72 | Resp 17 | Ht 68.0 in | Wt 213.0 lb

## 2021-07-09 DIAGNOSIS — L989 Disorder of the skin and subcutaneous tissue, unspecified: Secondary | ICD-10-CM

## 2021-07-09 DIAGNOSIS — I10 Essential (primary) hypertension: Secondary | ICD-10-CM | POA: Diagnosis not present

## 2021-07-09 DIAGNOSIS — K5901 Slow transit constipation: Secondary | ICD-10-CM

## 2021-07-09 DIAGNOSIS — Z23 Encounter for immunization: Secondary | ICD-10-CM | POA: Diagnosis not present

## 2021-07-09 DIAGNOSIS — Z6833 Body mass index (BMI) 33.0-33.9, adult: Secondary | ICD-10-CM

## 2021-07-09 DIAGNOSIS — R7303 Prediabetes: Secondary | ICD-10-CM | POA: Diagnosis not present

## 2021-07-09 DIAGNOSIS — F411 Generalized anxiety disorder: Secondary | ICD-10-CM

## 2021-07-09 DIAGNOSIS — E785 Hyperlipidemia, unspecified: Secondary | ICD-10-CM

## 2021-07-09 NOTE — Assessment & Plan Note (Signed)
Controlled, no change in medication  

## 2021-07-09 NOTE — Assessment & Plan Note (Signed)
Updated lab today Hyperlipidemia:Low fat diet discussed and encouraged.   Lipid Panel  Lab Results  Component Value Date   CHOL 161 10/16/2020   HDL 69 10/16/2020   LDLCALC 79 10/16/2020   TRIG 57 10/16/2020   CHOLHDL 2.3 10/16/2020

## 2021-07-09 NOTE — Assessment & Plan Note (Signed)
Controlled, no change in medication DASH diet and commitment to daily physical activity for a minimum of 30 minutes discussed and encouraged, as a part of hypertension management. The importance of attaining a healthy weight is also discussed.  BP/Weight 07/09/2021 03/27/2021 02/20/2021 10/22/2020 10/16/2020 05/13/2020 6/80/8811  Systolic BP 031 594 585 929 244 628 638  Diastolic BP 77 77 77 76 76 78 70  Wt. (Lbs) 213 218 217.12 221 221 215 216  BMI 32.39 33.15 33.01 33.6 33.6 32.69 32.84

## 2021-07-09 NOTE — Assessment & Plan Note (Signed)
Topical antibiotic twice daily x 3 days

## 2021-07-09 NOTE — Patient Instructions (Addendum)
Annual exam mid February, call if you need me sooner  Flu vaccine today  Please  get Covid vaccine  Please get shingrix vaccines  Fasting Lipid, cmp and eGFre, hBA1C asap  Stop aspirin and atorvastatin and work on diet and exercise  No infection in areas of cooncern, left breast and left leg, just use oTC triple antibiortic ointment twice daily for 3 days to both areas and then stop  It is important that you exercise regularly at least 30 minutes 5 times a week. If you develop chest pain, have severe difficulty breathing, or feel very tired, stop exercising immediately and seek medical attention  Think about what you will eat, plan ahead. Choose " clean, green, fresh or frozen" over canned, processed or packaged foods which are more sugary, salty and fatty. 70 to 75% of food eaten should be vegetables and fruit. Three meals at set times with snacks allowed between meals, but they must be fruit or vegetables. Aim to eat over a 12 hour period , example 7 am to 7 pm, and STOP after  your last meal of the day. Drink water,generally about 64 ounces per day, no other drink is as healthy. Fruit juice is best enjoyed in a healthy way, by EATING the fruit.   Thanks for choosing [facility], we consider it a privelige to serve you.

## 2021-07-09 NOTE — Assessment & Plan Note (Signed)
  Patient re-educated about  the importance of commitment to a  minimum of 150 minutes of exercise per week as able.  The importance of healthy food choices with portion control discussed, as well as eating regularly and within a 12 hour window most days. The need to choose "clean , green" food 50 to 75% of the time is discussed, as well as to make water the primary drink and set a goal of 64 ounces water daily.    Weight /BMI 07/09/2021 03/27/2021 02/20/2021  WEIGHT 213 lb 218 lb 217 lb 1.9 oz  HEIGHT 5\' 8"  5\' 8"  5\' 8"   BMI 32.39 kg/m2 33.15 kg/m2 33.01 kg/m2

## 2021-07-09 NOTE — Progress Notes (Signed)
Victoria Mcconnell     MRN: [id number]      DOB: [birth year]   HPI [name] is here for follow up and re-evaluation of chronic medical conditions, medication management and review of any available recent lab and radiology data.  Preventive health is updated, specifically  Cancer screening and Immunization.   Questions or concerns regarding consultations or procedures which the PT has had in the interim are  addressed. The PT denies any adverse reactions to current medications since the last visit.  C/o lefdt breast lesion and area on left leg where she thinks she had an insect bite, no redness or drainage from either place , no h/o fever or chills Wants to stop lipitor and work on diet and exercise ROS Denies recent fever or chills. Denies sinus pressure, nasal congestion, ear pain or sore throat. Denies chest congestion, productive cough or wheezing. Denies chest pains, palpitations and leg swelling Denies abdominal pain, nausea, vomiting,diarrhea or constipation.   Denies dysuria, frequency, hesitancy or incontinence. Denies joint pain, swelling and limitation in mobility. Denies headaches, seizures, numbness, or tingling. Denies depression, uncontrolled anxiety or insomnia.  PE  BP 123/77   Pulse 72   Resp 17   Ht 5\' 8"  (1.727 m)   Wt 213 lb (96.6 kg)   SpO2 97%   BMI 32.39 kg/m   Patient alert and oriented and in no cardiopulmonary distress.  HEENT: No facial asymmetry, EOMI,     Neck supple .  Chest: Clear to auscultation bilaterally. Breast: no palpable mass, no nipple inversion or discharge , no axillary or supraclavicular adenopathy. Hyperpigmented skin lesion , old scar tissue left breast, 1/4 pea size CVS: S1, S2 no murmurs, no S3.Regular rate.  ABD: Soft non tender.   Ext: No edema  MS: Adequate ROM spine, shoulders, hips and knees.  Skin: Intact, healing hyperpigmented scab on left leg  Psych: Good eye contact, normal affect. Memory intact not anxious or  depressed appearing.  CNS: CN 2-12 intact, power,  normal throughout.no focal deficits noted.   Assessment & Plan  Essential (primary) hypertension Controlled, no change in medication DASH diet and commitment to daily physical activity for a minimum of 30 minutes discussed and encouraged, as a part of hypertension management. The importance of attaining a healthy weight is also discussed.  BP/Weight 07/09/2021 03/27/2021 02/20/2021 10/22/2020 10/16/2020 05/13/2020 5/46/2703  Systolic BP 500 938 182 993 716 967 893  Diastolic BP 77 77 77 76 76 78 70  Wt. (Lbs) 213 218 217.12 221 221 215 216  BMI 32.39 33.15 33.01 33.6 33.6 32.69 32.84       Prediabetes Patient educated about the importance of limiting  Carbohydrate intake , the need to commit to daily physical activity for a minimum of 30 minutes , and to commit weight loss. The fact that changes in all these areas will reduce or eliminate all together the development of diabetes is stressed.   Diabetic Labs Latest Ref Rng & Units 03/27/2021 02/20/2021 02/20/2021 10/16/2020 05/13/2020  HbA1c 0.0 - 7.0 % - 5.9 5.9 6.4(H) 6.2(H)  Microalbumin mg/dL - - - - -  Micro/Creat Ratio <30 mcg/mg creat - - - - -  Chol <200 mg/dL - - - 161 -  HDL > OR = 50 mg/dL - - - 69 -  Calc LDL mg/dL (calc) - - - 79 -  Triglycerides <150 mg/dL - - - 57 -  Creatinine 0.57 - 1.00 mg/dL 1.05(H) - - 1.03(H) 1.17(H)  BP/Weight 07/09/2021 03/27/2021 02/20/2021 10/22/2020 10/16/2020 05/13/2020 2/70/7867  Systolic BP 544 920 100 712 197 588 325  Diastolic BP 77 77 77 76 76 78 70  Wt. (Lbs) 213 218 217.12 221 221 215 216  BMI 32.39 33.15 33.01 33.6 33.6 32.69 32.84   Foot/eye exam completion dates Latest Ref Rng & Units 09/13/2019 08/14/2019  Eye Exam No Retinopathy - No Retinopathy  Foot Form Completion - Done -    Updated lab needed    Hyperlipidemia Updated lab today Hyperlipidemia:Low fat diet discussed and encouraged.   Lipid Panel  Lab Results  Component Value  Date   CHOL 161 10/16/2020   HDL 69 10/16/2020   LDLCALC 79 10/16/2020   TRIG 57 10/16/2020   CHOLHDL 2.3 10/16/2020       Body mass index (BMI) 33.0-33.9, adult  Patient re-educated about  the importance of commitment to a  minimum of 150 minutes of exercise per week as able.  The importance of healthy food choices with portion control discussed, as well as eating regularly and within a 12 hour window most days. The need to choose "clean , green" food 50 to 75% of the time is discussed, as well as to make water the primary drink and set a goal of 64 ounces water daily.    Weight /BMI 07/09/2021 03/27/2021 02/20/2021  WEIGHT 213 lb 218 lb 217 lb 1.9 oz  HEIGHT 5\' 8"  5\' 8"  5\' 8"   BMI 32.39 kg/m2 33.15 kg/m2 33.01 kg/m2      GAD (generalized anxiety disorder) Controlled, no change in medication   Constipation Controlled, no change in medication   Skin lesions Topical antibiotic twice daily x 3 days

## 2021-07-09 NOTE — Assessment & Plan Note (Signed)
Patient educated about the importance of limiting  Carbohydrate intake , the need to commit to daily physical activity for a minimum of 30 minutes , and to commit weight loss. The fact that changes in all these areas will reduce or eliminate all together the development of diabetes is stressed.   Diabetic Labs Latest Ref Rng & Units 03/27/2021 02/20/2021 02/20/2021 10/16/2020 05/13/2020  HbA1c 0.0 - 7.0 % - 5.9 5.9 6.4(H) 6.2(H)  Microalbumin mg/dL - - - - -  Micro/Creat Ratio <30 mcg/mg creat - - - - -  Chol <200 mg/dL - - - 161 -  HDL > OR = 50 mg/dL - - - 69 -  Calc LDL mg/dL (calc) - - - 79 -  Triglycerides <150 mg/dL - - - 57 -  Creatinine 0.57 - 1.00 mg/dL 1.05(H) - - 1.03(H) 1.17(H)   BP/Weight 07/09/2021 03/27/2021 02/20/2021 10/22/2020 10/16/2020 05/13/2020 4/81/8563  Systolic BP 149 702 637 858 850 277 412  Diastolic BP 77 77 77 76 76 78 70  Wt. (Lbs) 213 218 217.12 221 221 215 216  BMI 32.39 33.15 33.01 33.6 33.6 32.69 32.84   Foot/eye exam completion dates Latest Ref Rng & Units 09/13/2019 08/14/2019  Eye Exam No Retinopathy - No Retinopathy  Foot Form Completion - Done -    Updated lab needed

## 2021-07-10 ENCOUNTER — Other Ambulatory Visit: Payer: Self-pay | Admitting: Family Medicine

## 2021-07-10 DIAGNOSIS — N1831 Chronic kidney disease, stage 3a: Secondary | ICD-10-CM

## 2021-07-10 LAB — LIPID PANEL
Chol/HDL Ratio: 2.5 ratio (ref 0.0–4.4)
Cholesterol, Total: 177 mg/dL (ref 100–199)
HDL: 71 mg/dL (ref >39)
LDL Chol Calc (NIH): 92 mg/dL (ref 0–99)
Triglycerides: 74 mg/dL (ref 0–149)
VLDL Cholesterol Cal: 14 mg/dL (ref 5–40)

## 2021-07-10 LAB — CMP14+EGFR
ALT: 8 IU/L (ref 0–32)
AST: 17 IU/L (ref 0–40)
Albumin/Globulin Ratio: 1.3 (ref 1.2–2.2)
Albumin: 4.3 g/dL (ref 3.7–4.7)
Alkaline Phosphatase: 90 IU/L (ref 44–121)
BUN/Creatinine Ratio: 16 (ref 12–28)
BUN: 19 mg/dL (ref 8–27)
Bilirubin Total: 0.3 mg/dL (ref 0.0–1.2)
CO2: 21 mmol/L (ref 20–29)
Calcium: 10.2 mg/dL (ref 8.7–10.3)
Chloride: 105 mmol/L (ref 96–106)
Creatinine, Ser: 1.18 mg/dL — ABNORMAL HIGH (ref 0.57–1.00)
Globulin, Total: 3.2 g/dL (ref 1.5–4.5)
Glucose: 113 mg/dL — ABNORMAL HIGH (ref 70–99)
Potassium: 3.9 mmol/L (ref 3.5–5.2)
Sodium: 141 mmol/L (ref 134–144)
Total Protein: 7.5 g/dL (ref 6.0–8.5)
eGFR: 48 mL/min/{1.73_m2} — ABNORMAL LOW (ref >59)

## 2021-07-10 LAB — HEMOGLOBIN A1C
Est. average glucose Bld gHb Est-mCnc: 126 mg/dL
Hgb A1c MFr Bld: 6 % — ABNORMAL HIGH (ref 4.8–5.6)

## 2021-07-31 ENCOUNTER — Ambulatory Visit (HOSPITAL_COMMUNITY): Payer: Medicare Other

## 2021-08-06 ENCOUNTER — Ambulatory Visit (HOSPITAL_COMMUNITY)
Admission: RE | Admit: 2021-08-06 | Discharge: 2021-08-06 | Disposition: A | Discharge status: Home / Self Care | Payer: Medicare Other | Source: Ambulatory Visit | Attending: Family Medicine | Admitting: Family Medicine

## 2021-08-06 ENCOUNTER — Other Ambulatory Visit: Payer: Self-pay

## 2021-08-06 ENCOUNTER — Ambulatory Visit (HOSPITAL_COMMUNITY): Payer: Medicare Other

## 2021-08-06 DIAGNOSIS — Z1231 Encounter for screening mammogram for malignant neoplasm of breast: Secondary | ICD-10-CM

## 2021-08-08 ENCOUNTER — Other Ambulatory Visit: Payer: Self-pay

## 2021-08-08 DIAGNOSIS — Z1231 Encounter for screening mammogram for malignant neoplasm of breast: Secondary | ICD-10-CM

## 2021-08-08 NOTE — Progress Notes (Signed)
Patient is aware and number given to radiology

## 2021-08-11 ENCOUNTER — Other Ambulatory Visit (HOSPITAL_COMMUNITY): Payer: Self-pay | Admitting: Family Medicine

## 2021-08-11 DIAGNOSIS — R928 Other abnormal and inconclusive findings on diagnostic imaging of breast: Secondary | ICD-10-CM

## 2021-08-12 ENCOUNTER — Other Ambulatory Visit: Payer: Self-pay

## 2021-08-12 ENCOUNTER — Ambulatory Visit (HOSPITAL_COMMUNITY)
Admission: RE | Admit: 2021-08-12 | Discharge: 2021-08-12 | Disposition: A | Discharge status: Home / Self Care | Payer: Medicare Other | Source: Ambulatory Visit | Attending: Family Medicine | Admitting: Family Medicine

## 2021-08-12 DIAGNOSIS — R922 Inconclusive mammogram: Secondary | ICD-10-CM | POA: Diagnosis not present

## 2021-08-12 DIAGNOSIS — R928 Other abnormal and inconclusive findings on diagnostic imaging of breast: Secondary | ICD-10-CM

## 2021-08-21 ENCOUNTER — Ambulatory Visit (INDEPENDENT_AMBULATORY_CARE_PROVIDER_SITE_OTHER): Payer: Medicare Other | Admitting: Internal Medicine

## 2021-08-21 ENCOUNTER — Encounter: Payer: Self-pay | Admitting: Internal Medicine

## 2021-08-21 ENCOUNTER — Other Ambulatory Visit: Payer: Self-pay

## 2021-08-21 DIAGNOSIS — J069 Acute upper respiratory infection, unspecified: Secondary | ICD-10-CM

## 2021-08-21 MED ORDER — BENZONATATE 100 MG PO CAPS: 100.0000 mg | ORAL_CAPSULE | Freq: Two times a day (BID) | ORAL | 0 refills | Status: DC | PRN

## 2021-08-21 NOTE — Progress Notes (Signed)
Virtual Visit via Telephone Note   This visit type was conducted due to national recommendations for restrictions regarding the COVID-19 Pandemic (e.g. social distancing) in an effort to limit this patient's exposure and mitigate transmission in our community.  Due to her co-morbid illnesses, this patient is at least at moderate risk for complications without adequate follow up.  This format is felt to be most appropriate for this patient at this time.  The patient did not have access to video technology/had technical difficulties with video requiring transitioning to audio format only (telephone).  All issues noted in this document were discussed and addressed.  No physical exam could be performed with this format.  Evaluation Performed:  Follow-up visit  Date:  08/21/2021   ID:  Paulett, Kaufhold 1945/12/01, MRN [id number]  Patient Location: Home Provider Location: Office/Clinic  Participants: Patient Location of Patient: Home Location of Provider: Telehealth Consent was obtain for visit to be over via telehealth. I verified that I am speaking with the correct person using two identifiers.  PCP:  Fayrene Helper, MD   Chief Complaint: Cough, nasal congestion and sneezing  History of Present Illness:    Victoria Mcconnell is a 75 y.o. female who has a televisit for complaint of cough, nasal congestion and sneezing for last 5-6 days.  She denies any fever or chills.  Denies any nausea vomiting currently.  She has tried OTC Mucinex with some relief.  Her symptoms have been improving since this morning.  Denies any dyspnea or wheezing currently.  Her home COVID test was negative.  The patient does not have symptoms concerning for COVID-19 infection (fever, chills, cough, or new shortness of breath).   Past Medical, Surgical, Social History, Allergies, and Medications have been Reviewed.  Past Medical History:  Diagnosis Date   Allergy    seasonal allergies   Anxiety     Arthritis    Depression, major, single episode, moderate ([place]) 07/01/2015   Diabetes mellitus    Glaucoma    Hyperlipidemia    Hypertension    Osteopenia    Past Surgical History:  Procedure Laterality Date   ABDOMINAL HYSTERECTOMY     SPINE SURGERY N/A 11/11/2020   lam facetectomy & foramotomy 1 vrt sgm lumbar     Current Meds  Medication Sig   acetaminophen (TYLENOL) 500 MG tablet Take 500 mg by mouth 2 (two) times daily as needed.   amLODipine (NORVASC) 5 MG tablet Take 1 tablet (5 mg total) by mouth daily.   brimonidine (ALPHAGAN) 0.2 % ophthalmic solution Place 1 drop into both eyes in the morning and at bedtime.   Cholecalciferol (VITAMIN D) 50 MCG (2000 UT) tablet Take 1 tablet (2,000 Units total) by mouth daily.   dorzolamide-timolol (COSOPT) 22.3-6.8 MG/ML ophthalmic solution Place 1 drop into both eyes 2 times daily.   escitalopram (LEXAPRO) 10 MG tablet Take 1 tablet (10 mg total) by mouth daily.   gabapentin (NEURONTIN) 100 MG capsule TAKE 1 TO 2 CAPSULES BY MOUTH AT BEDTIME   glucose blood (ACCU-CHEK AVIVA PLUS) test strip CHECK BLOOD GLUCOSE once daily dx e11.9   glucose blood (ONETOUCH ULTRA) test strip USE AS DIRECTED TO MONITOR  FASTING BLOOD SUGAR ONCE  DAILY   Lancets MISC Please dispense based on patient and insurance preference. Use as directed to monitor FSBS 1x daily. Dx: E11.9.   linaclotide (LINZESS) 72 MCG capsule Take 1 capsule (72 mcg total) by mouth daily before breakfast.  montelukast (SINGULAIR) 10 MG tablet Take 1 tablet (10 mg total) by mouth at bedtime.   valsartan-hydrochlorothiazide (DIOVAN-HCT) 160-25 MG tablet Take 1 tablet by mouth daily.     Allergies:   Patient has no known allergies.   ROS:   Please see the history of present illness.     All other systems reviewed and are negative.   Labs/Other Tests and Data Reviewed:    Recent Labs: 10/16/2020: Hemoglobin 11.9; Platelets 245 07/09/2021: ALT 8; BUN 19; Creatinine, Ser 1.18;  Potassium 3.9; Sodium 141   Recent Lipid Panel Lab Results  Component Value Date/Time   CHOL 177 07/09/2021 09:07 AM   TRIG 74 07/09/2021 09:07 AM   HDL 71 07/09/2021 09:07 AM   CHOLHDL 2.5 07/09/2021 09:07 AM   CHOLHDL 2.3 10/16/2020 09:52 AM   LDLCALC 92 07/09/2021 09:07 AM   LDLCALC 79 10/16/2020 09:52 AM    Wt Readings from Last 3 Encounters:  07/09/21 213 lb (96.6 kg)  03/27/21 218 lb (98.9 kg)  02/20/21 217 lb 1.9 oz (98.5 kg)     ASSESSMENT & PLAN:    URTI Symptoms improving with symptomatic treatment Continue Mucinex as needed for cough and nasal congestion Tessalon as needed for cough Advised to contact if persistent or worsening symptoms  Time:   Today, I have spent 9 minutes reviewing the chart, including problem list, medications, and with the patient with telehealth technology discussing the above problems.   Medication Adjustments/Labs and Tests Ordered: Current medicines are reviewed at length with the patient today.  Concerns regarding medicines are outlined above.   Tests Ordered: No orders of the defined types were placed in this encounter.   Medication Changes: No orders of the defined types were placed in this encounter.    Note: This dictation was prepared with Dragon dictation along with smaller phrase technology. Similar sounding words can be transcribed inadequately or may not be corrected upon review. Any transcriptional errors that result from this process are unintentional.      Disposition:  Follow up  Signed, Lindell Spar, MD  08/21/2021 12:22 PM     [facility]

## 2021-08-25 ENCOUNTER — Telehealth: Payer: Medicare Other | Admitting: Nurse Practitioner

## 2021-09-18 IMAGING — MR MR LUMBAR SPINE W/O CM
5 series · 31 of 48 positions shown · non-contrast
Comparison: None.

CLINICAL DATA: Right-sided radiculopathy. Low back pain radiating
to both legs for 2 months.

EXAM:
MRI LUMBAR SPINE WITHOUT CONTRAST
TECHNIQUE: Multiplanar, multisequence MR imaging of the lumbar spine was
performed. No intravenous contrast was administered.

[Series 5: T2 · sagittal · 4.0mm · 0.68mm/px · 6 of 15 slices shown (1 of 2)]
[im 1/15]
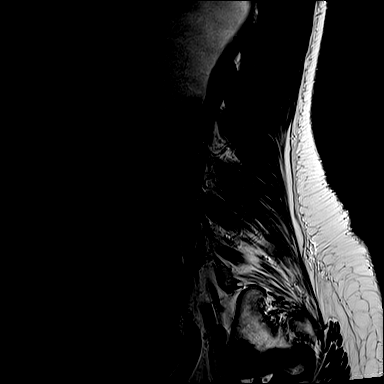
[im 3/15]
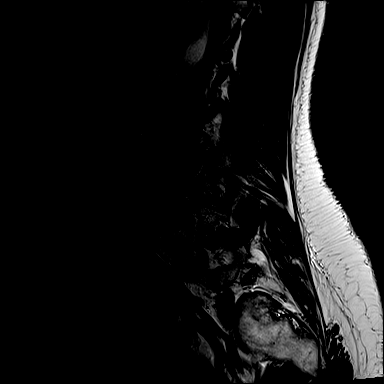
[im 6/15]
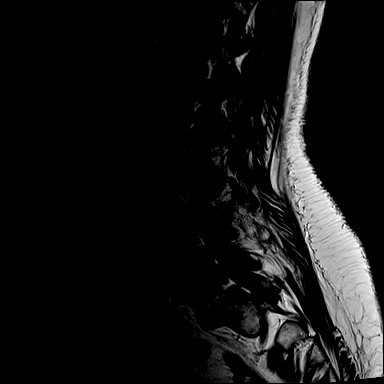
[im 9/15]
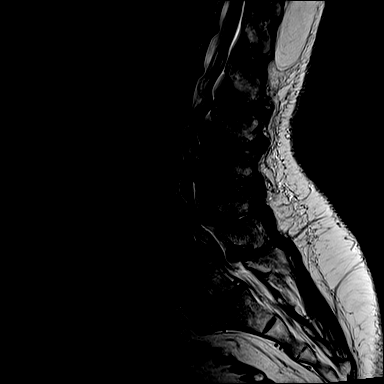
[im 12/15]
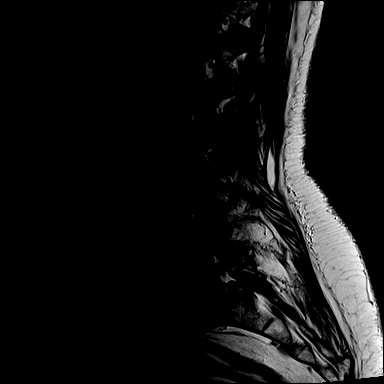
[im 15/15]
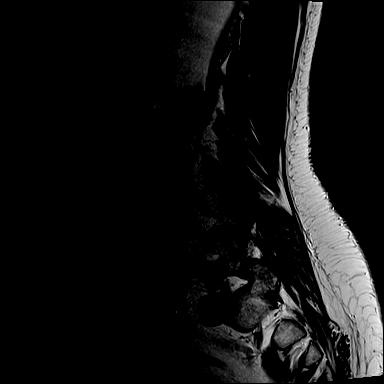

[Series 7: T1 · sagittal · 4.0mm · 0.81mm/px · 7 of 15 slices shown (1 of 2)]
[im 1/15]
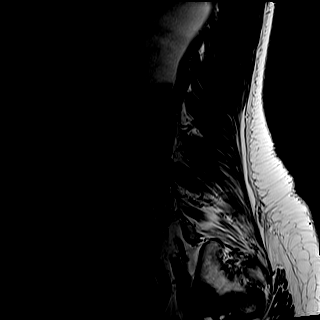
[im 3/15]
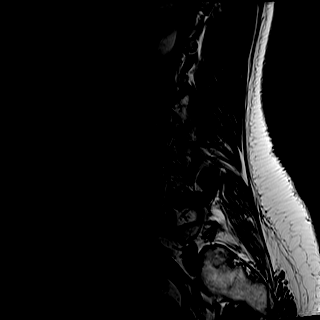
[im 5/15]
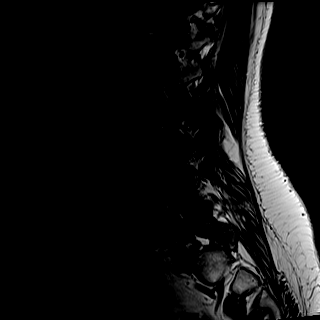
[im 8/15]
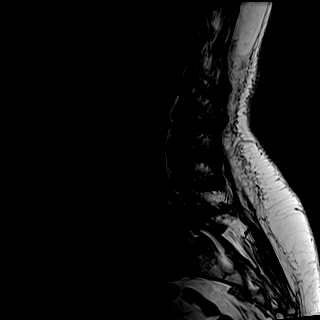
[im 10/15]
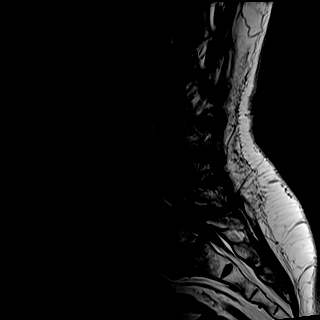
[im 12/15]
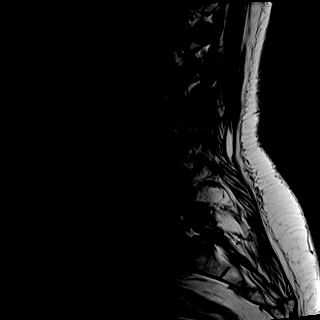
[im 15/15]
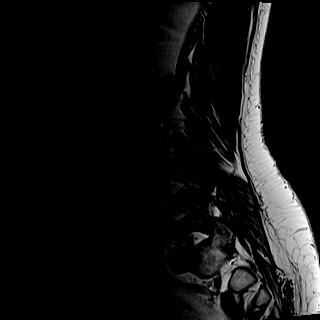

[Series 8: STIR · sagittal · 4.0mm · 0.51mm/px · 2 of 15 slices shown]
[im 1/15]
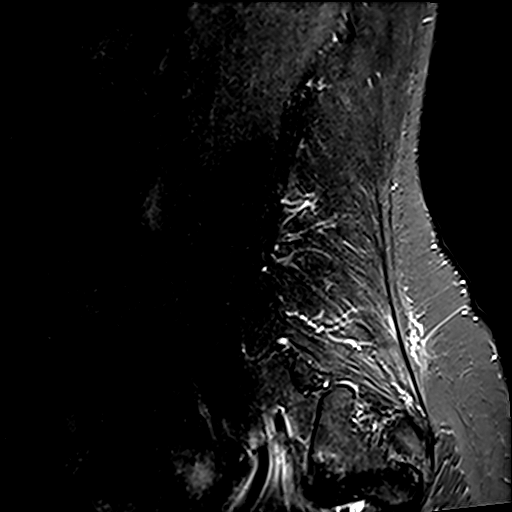
[im 3/15]
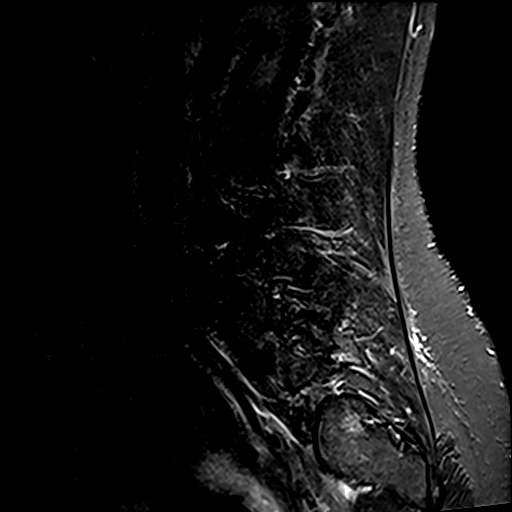

[Series 9: T2 · axial · 4.0mm · 0.70mm/px · z∈[-96,+80]mm · 8 of 31 slices shown (2 of 2)]
[im 1/31]
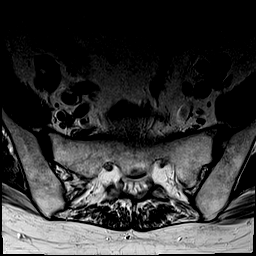
[im 5/31]
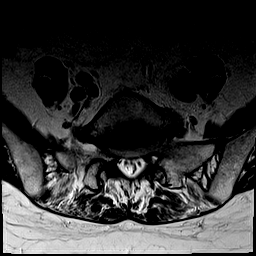
[im 10/31]
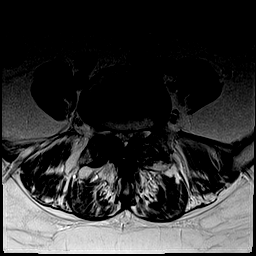
[im 14/31]
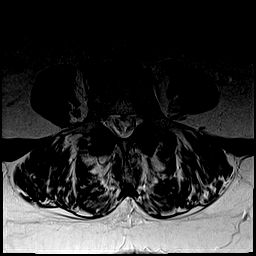
[im 17/31]
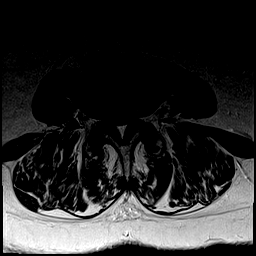
[im 21/31]
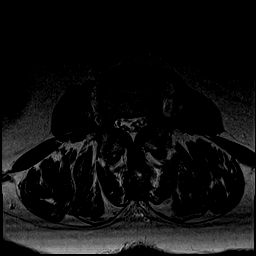
[im 26/31]
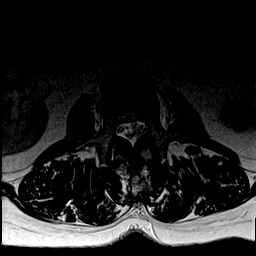
[im 31/31]
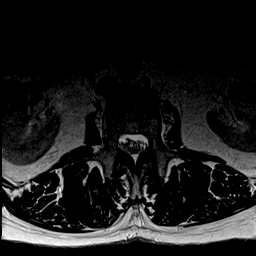

[Series 10: T1 · axial · 4.0mm · 0.35mm/px · z∈[-96,+80]mm · 8 of 31 slices shown (2 of 2)]
[im 1/31]
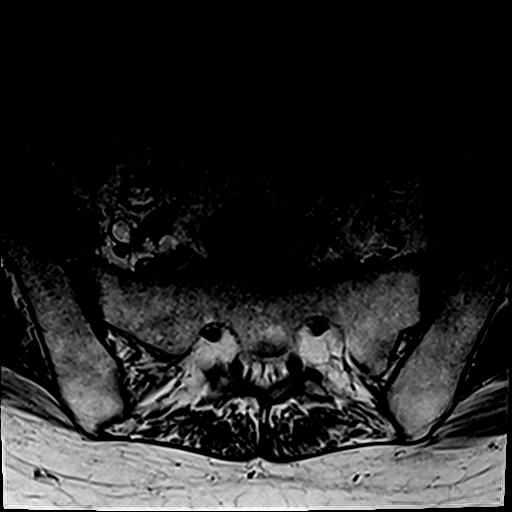
[im 5/31]
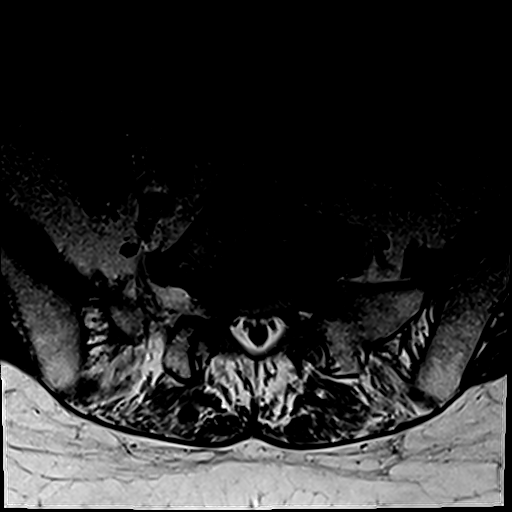
[im 10/31]
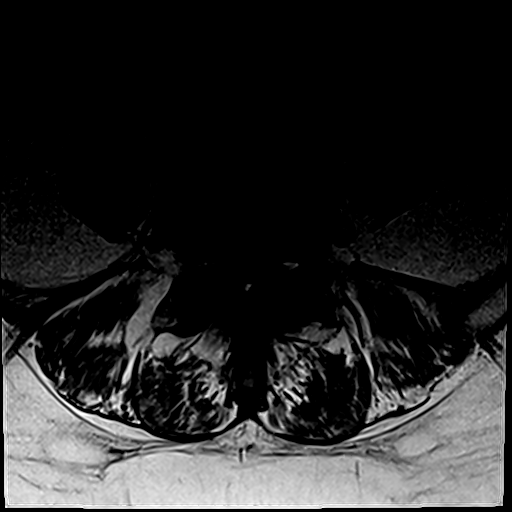
[im 14/31]
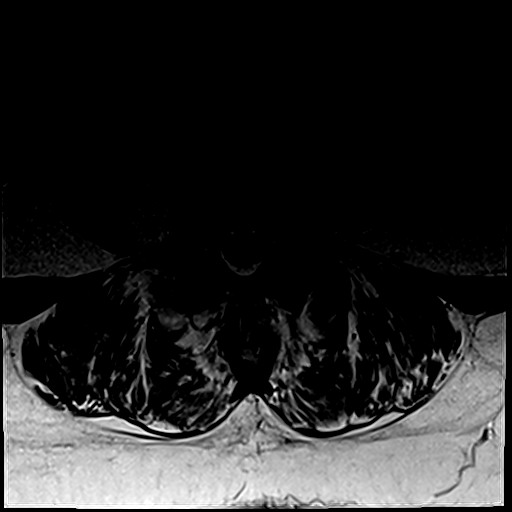
[im 17/31]
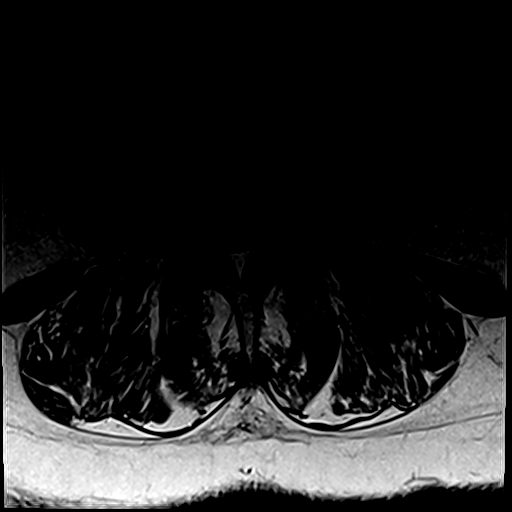
[im 21/31]
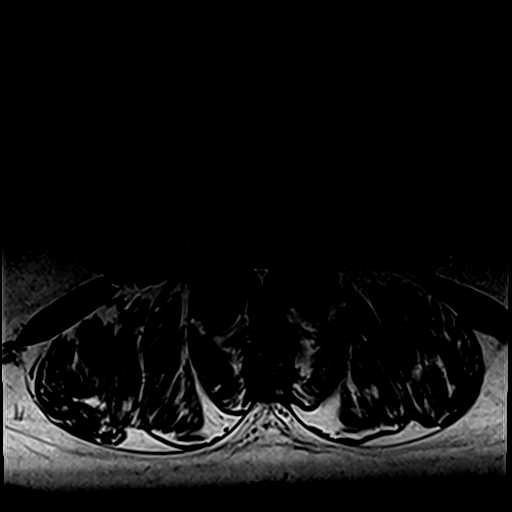
[im 26/31]
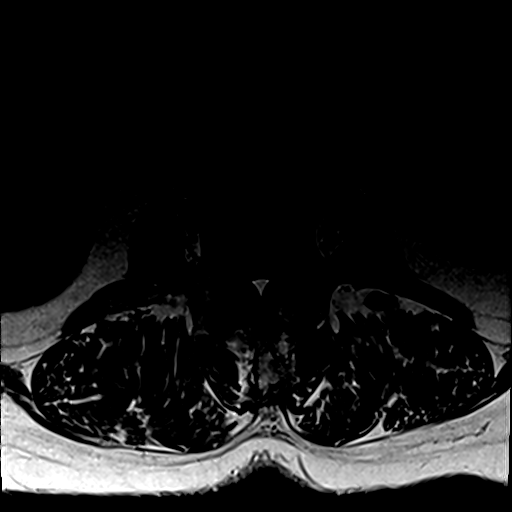
[im 31/31]
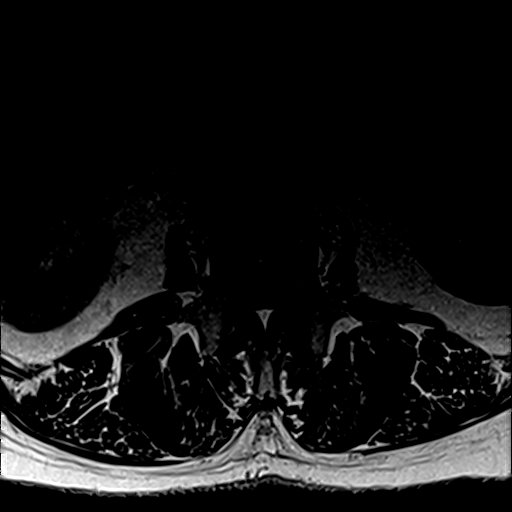

[31 of 48 positions shown; findings below may reference images not displayed]

FINDINGS: Segmentation:  Standard.

Alignment:  Grade 1 anterolisthesis at L3-4, L4-5 and L5-S1

Vertebrae:  No fracture, evidence of discitis, or bone lesion.

Conus medullaris and cauda equina: Conus extends to the L1 level.
Conus and cauda equina appear normal.

Paraspinal and other soft tissues: Negative

Disc levels:

Small disc bulges at T11-12 and T12-L1 without stenosis.

L1-L2: Small disc bulge. Mild spinal canal stenosis. No neural
foraminal stenosis.

L2-L3: Intermediate diffuse disc bulge with mild facet hypertrophy.
Moderate spinal canal stenosis. No neural foraminal stenosis.

L3-L4: Severe facet hypertrophy with grade 1 anterolisthesis.
Intermediate disc bulge. Severe spinal canal stenosis. Mild right
and moderate left neural foraminal stenosis.

L4-L5: Severe facet hypertrophy with intermediate disc bulge.
Moderate spinal canal stenosis. Mild bilateral neural foraminal
stenosis.

L5-S1: Moderate facet hypertrophy with small disc bulge and annular
fissure. There is no spinal canal stenosis. No neural foraminal
stenosis.

Visualized sacrum: Normal.
IMPRESSION: 1. Severe spinal canal stenosis at L3-4 secondary to combination of
disc bulge and severe facet arthrosis.
2. Moderate spinal canal stenosis at L2-3 and L4-5.
3. Moderate left L3-4 neural foraminal stenosis. Mild bilateral L4-5
neural foraminal stenosis.

## 2021-10-14 DIAGNOSIS — N1831 Chronic kidney disease, stage 3a: Secondary | ICD-10-CM | POA: Diagnosis not present

## 2021-10-14 DIAGNOSIS — H409 Unspecified glaucoma: Secondary | ICD-10-CM | POA: Diagnosis not present

## 2021-10-14 DIAGNOSIS — E785 Hyperlipidemia, unspecified: Secondary | ICD-10-CM | POA: Diagnosis not present

## 2021-10-14 DIAGNOSIS — I129 Hypertensive chronic kidney disease with stage 1 through stage 4 chronic kidney disease, or unspecified chronic kidney disease: Secondary | ICD-10-CM | POA: Diagnosis not present

## 2021-10-14 DIAGNOSIS — R7303 Prediabetes: Secondary | ICD-10-CM | POA: Diagnosis not present

## 2021-10-29 DIAGNOSIS — L819 Disorder of pigmentation, unspecified: Secondary | ICD-10-CM | POA: Diagnosis not present

## 2021-10-29 DIAGNOSIS — L700 Acne vulgaris: Secondary | ICD-10-CM | POA: Diagnosis not present

## 2021-11-05 ENCOUNTER — Encounter: Payer: Medicare Other | Admitting: Family Medicine

## 2021-11-05 NOTE — Progress Notes (Signed)
° ° °[facility] °[address] °[place], [place] [address] ° ° °CLINIC:  °Medical Oncology/Hematology ° °CONSULT NOTE ° °Patient Care Team: °Simpson, Margaret E, MD as PCP - General (Family Medicine) °Davis, Christian L, RPH as Pharmacist (Pharmacist) ° °CHIEF COMPLAINTS/PURPOSE OF CONSULTATION:  °Evaluation of possible MGUS ° °HISTORY OF PRESENTING ILLNESS:  °Victoria Mcconnell 75 y.o. female is here at the request of her nephrologist (Dr. Elizabeth Upton) for work-up of possible MGUS. ° °Laboratory work-up sent by her nephrologist (10/14/2021) shows M spike of 1.0 and immunofixation showing IgG lambda monoclonal protein.  Kappa light chains 27.4 with lambda light chains 32.1 and ratio 0.85. ° °She denies any new bone pain or recent fractures. °She denies any B symptoms such as fever, chills, night sweats, unintentional weight loss.  No abnormal fatigue.  No new neurologic symptoms such as tinnitus, new-onset hearing loss, blurred vision, headache, or dizziness.  Denies any numbness or tingling in hands or feet. °No thromboembolic events since her last visit.  °No new masses or lymphadenopathy per her report. ° °Her past medical history is positive for CKD stage IIIa, hypertension, and type 2 diabetes mellitus. °Of note, her family history is positive for a sister with multiple myeloma. ° °She has no complaints at today's visit.  She reports that her energy and appetite are both 100%. ° ° °MEDICAL HISTORY:  °Past Medical History:  °Diagnosis Date  ° Allergy   ° seasonal allergies  ° Anxiety   ° Arthritis   ° Depression, major, single episode, moderate (HCC) 07/01/2015  ° Diabetes mellitus   ° Glaucoma   ° Hyperlipidemia   ° Hypertension   ° Osteopenia   ° ° °SURGICAL HISTORY: °Past Surgical History:  °Procedure Laterality Date  ° ABDOMINAL HYSTERECTOMY    ° SPINE SURGERY N/A 11/11/2020  ° lam facetectomy & foramotomy 1 vrt sgm lumbar  ° ° °SOCIAL HISTORY: °Social History  ° °Socioeconomic History  ° Marital  status: Widowed  °  Spouse name: Not on file  ° Number of children: Not on file  ° Years of education: Not on file  ° Highest education level: Not on file  °Occupational History  ° Not on file  °Tobacco Use  ° Smoking status: Former  °  Types: Cigarettes  °  Quit date: 09/08/1981  °  Years since quitting: 40.1  ° Smokeless tobacco: Never  °Substance and Sexual Activity  ° Alcohol use: No  ° Drug use: No  ° Sexual activity: Not Currently  °Other Topics Concern  ° Not on file  °Social History Narrative  ° Not on file  ° °Social Determinants of Health  ° °Financial Resource Strain: Not on file  °Food Insecurity: Not on file  °Transportation Needs: Not on file  °Physical Activity: Not on file  °Stress: Not on file  °Social Connections: Not on file  °Intimate Partner Violence: Not on file  ° ° °FAMILY HISTORY: °Family History  °Problem Relation Age of Onset  ° Arthritis Mother   ° Cancer Sister   ° Diabetes Sister   ° Hyperlipidemia Sister   ° Hypertension Sister   ° ° °ALLERGIES:  has No Known Allergies. ° °MEDICATIONS:  °Current Outpatient Medications  °Medication Sig Dispense Refill  ° acetaminophen (TYLENOL) 500 MG tablet Take 500 mg by mouth 2 (two) times daily as needed.    ° amLODipine (NORVASC) 5 MG tablet Take 1 tablet (5 mg total) by mouth daily. 90 tablet 1  °   benzonatate (TESSALON) 100 MG capsule Take 1 capsule (100 mg total) by mouth 2 (two) times daily as needed for cough. 20 capsule 0  ° brimonidine (ALPHAGAN) 0.2 % ophthalmic solution Place 1 drop into both eyes in the morning and at bedtime.    ° Cholecalciferol (VITAMIN D) 50 MCG (2000 UT) tablet Take 1 tablet (2,000 Units total) by mouth daily.    ° dorzolamide-timolol (COSOPT) 22.3-6.8 MG/ML ophthalmic solution Place 1 drop into both eyes 2 times daily.    ° escitalopram (LEXAPRO) 10 MG tablet Take 1 tablet (10 mg total) by mouth daily. 90 tablet 1  ° gabapentin (NEURONTIN) 100 MG capsule TAKE 1 TO 2 CAPSULES BY MOUTH AT BEDTIME 180 capsule 1  °  glucose blood (ACCU-CHEK AVIVA PLUS) test strip CHECK BLOOD GLUCOSE once daily dx e11.9 300 each 3  ° glucose blood (ONETOUCH ULTRA) test strip USE AS DIRECTED TO MONITOR  FASTING BLOOD SUGAR ONCE  DAILY 100 strip 3  ° Lancets MISC Please dispense based on patient and insurance preference. Use as directed to monitor FSBS 1x daily. Dx: E11.9. 100 each 1  ° linaclotide (LINZESS) 72 MCG capsule Take 1 capsule (72 mcg total) by mouth daily before breakfast. 90 capsule 1  ° montelukast (SINGULAIR) 10 MG tablet Take 1 tablet (10 mg total) by mouth at bedtime. 90 tablet 1  ° valsartan-hydrochlorothiazide (DIOVAN-HCT) 160-25 MG tablet Take 1 tablet by mouth daily. 90 tablet 1  ° °No current facility-administered medications for this visit.  ° ° °REVIEW OF SYSTEMS:   °Review of Systems  °Constitutional:  Negative for appetite change, chills, diaphoresis, fatigue, fever and unexpected weight change.  °HENT:   Negative for lump/mass and nosebleeds.   °Eyes:  Negative for eye problems.  °Respiratory:  Negative for cough, hemoptysis and shortness of breath.   °Cardiovascular:  Negative for chest pain, leg swelling and palpitations.  °Gastrointestinal:  Negative for abdominal pain, blood in stool, constipation, diarrhea, nausea and vomiting.  °Genitourinary:  Negative for hematuria.   °Skin: Negative.   °Neurological:  Negative for dizziness, headaches and light-headedness.  °Hematological:  Does not bruise/bleed easily.   ° ° °PHYSICAL EXAMINATION: °ECOG PERFORMANCE STATUS: 0 - Asymptomatic ° °There were no vitals filed for this visit. °There were no vitals filed for this visit. ° °Physical Exam °Constitutional:   °   Appearance: Normal appearance.  °HENT:  °   Head: Normocephalic and atraumatic.  °   Mouth/Throat:  °   Mouth: Mucous membranes are moist.  °Eyes:  °   Extraocular Movements: Extraocular movements intact.  °   Pupils: Pupils are equal, round, and reactive to light.  °Cardiovascular:  °   Rate and Rhythm: Normal  rate and regular rhythm.  °   Pulses: Normal pulses.  °   Heart sounds: Normal heart sounds.  °Pulmonary:  °   Effort: Pulmonary effort is normal.  °   Breath sounds: Normal breath sounds.  °Abdominal:  °   General: Bowel sounds are normal.  °   Palpations: Abdomen is soft.  °   Tenderness: There is no abdominal tenderness.  °Musculoskeletal:     °   General: No swelling.  °   Right lower leg: No edema.  °   Left lower leg: No edema.  °Lymphadenopathy:  °   Cervical: No cervical adenopathy.  °Skin: °   General: Skin is warm and dry.  °Neurological:  °   General: No focal deficit present.  °     Mental Status: She is alert and oriented to person, place, and time.  °Psychiatric:     °   Mood and Affect: Mood normal.     °   Behavior: Behavior normal.  °  ° ° °LABORATORY DATA:  °I have reviewed the data as listed °No results found for this or any previous visit (from the past 2160 hour(s)). ° °RADIOGRAPHIC STUDIES: °I have personally reviewed the radiological images as listed and agreed with the findings in the report. °No results found. ° °ASSESSMENT & PLAN: °1.  MGUS, IgG lambda °- Seen at the request of her nephrologist (Dr. Elizabeth Upton) for work-up of possible MGUS. °- Laboratory work-up sent by her nephrologist (10/14/2021) shows M spike of 1.0 and immunofixation showing IgG lambda monoclonal protein.  Kappa light chains 27.4 with lambda light chains 32.1 and ratio 0.85. °- Additional labs (10/14/2021): Creatinine 1.05, calcium 10.5 °- No recent CBC on file.  Last CBC over a year ago showed Hgb 11.9. °- She denies any new bone pain, B symptoms, or neurologic changes. °- Patient's sister had multiple myeloma. °- PLAN: Labs today to complete work-up of MGUS with LDH, beta-2 microglobulin, and CBC. °- We will also check vitamin D and intact PTH due to mildly elevated calcium °- 24-hour urine kit sent home to check UPEP/urine IFE °- Check skeletal survey °- RTC in 2 weeks to discuss results ° °2.  Other history °-  PMH: CKD stage IIIa, hypertension, type 2 diabetes mellitus °- SOCIAL: She is retired.  She is a former smoker, who smokes cigarettes x5 years but quit 40+ years ago.  She denies any alcohol or illicit drugs. °- FAMILY: Patient's sister had multiple myeloma, underwent SCT but relapsed and is now on maintenance medications.  Patient's father had prostate cancer. ° ° °PLAN SUMMARY & DISPOSITION: °Labs today °Skeletal survey °24-hour urine study °RTC in 2 weeks ° °All questions were answered. The patient knows to call the clinic with any problems, questions or concerns. ° ° °Medical decision making: Moderate ° °Time spent on visit: I spent 25 minutes counseling the patient face to face. The total time spent in the appointment was 40 minutes and more than 50% was on counseling. ° °I, Rebekah Pennington PA-C, have seen this patient in conjunction with Dr. Sreedhar Katragadda.  Greater than 50% of visit was performed by Dr. Katragadda.  ° °Rebekah M Pennington, PA-C °11/06/2021 10:13 AM ° °DR. KATRAGADDA: I have independently evaluated this patient and formulated my own assessment and plan.  I agree with HPI and assessment/plan written by Rebecca Pennington, PA-C.  Patient evaluated for IgG lambda plasma cell disorder when she was found to have M spike of 1 g with a normal light chain ratio.  She was also found to have mildly elevated calcium.  We have discussed the normal prognosis of MGUS with the patient in detail.  We have recommended further work-up with urine studies and skeletal survey.  We have also discussed close monitoring with every 6-month visits. °

## 2021-11-06 ENCOUNTER — Ambulatory Visit (HOSPITAL_COMMUNITY)
Admission: RE | Admit: 2021-11-06 | Discharge: 2021-11-06 | Disposition: A | Discharge status: Home / Self Care | Payer: Medicare Other | Source: Ambulatory Visit | Attending: Physician Assistant | Admitting: Physician Assistant

## 2021-11-06 ENCOUNTER — Other Ambulatory Visit: Payer: Self-pay

## 2021-11-06 ENCOUNTER — Inpatient Hospital Stay (HOSPITAL_COMMUNITY): Payer: Medicare Other | Attending: Hematology | Admitting: Hematology

## 2021-11-06 ENCOUNTER — Inpatient Hospital Stay (HOSPITAL_COMMUNITY): Payer: Medicare Other

## 2021-11-06 ENCOUNTER — Encounter (HOSPITAL_COMMUNITY): Payer: Self-pay | Admitting: Hematology

## 2021-11-06 DIAGNOSIS — D472 Monoclonal gammopathy: Secondary | ICD-10-CM | POA: Insufficient documentation

## 2021-11-06 DIAGNOSIS — N1831 Chronic kidney disease, stage 3a: Secondary | ICD-10-CM | POA: Insufficient documentation

## 2021-11-06 DIAGNOSIS — I129 Hypertensive chronic kidney disease with stage 1 through stage 4 chronic kidney disease, or unspecified chronic kidney disease: Secondary | ICD-10-CM | POA: Diagnosis not present

## 2021-11-06 DIAGNOSIS — Z87891 Personal history of nicotine dependence: Secondary | ICD-10-CM | POA: Insufficient documentation

## 2021-11-06 DIAGNOSIS — E1122 Type 2 diabetes mellitus with diabetic chronic kidney disease: Secondary | ICD-10-CM | POA: Insufficient documentation

## 2021-11-06 LAB — CBC WITH DIFFERENTIAL/PLATELET
Abs Immature Granulocytes: 0.03 10*3/uL (ref 0.00–0.07)
Basophils Absolute: 0.1 10*3/uL (ref 0.0–0.1)
Basophils Relative: 1 %
Eosinophils Absolute: 0.1 10*3/uL (ref 0.0–0.5)
Eosinophils Relative: 1 %
HCT: 35.5 % — ABNORMAL LOW (ref 36.0–46.0)
Hemoglobin: 11.4 g/dL — ABNORMAL LOW (ref 12.0–15.0)
Immature Granulocytes: 0 %
Lymphocytes Relative: 17 %
Lymphs Abs: 1.6 10*3/uL (ref 0.7–4.0)
MCH: 28.3 pg (ref 26.0–34.0)
MCHC: 32.1 g/dL (ref 30.0–36.0)
MCV: 88.1 fL (ref 80.0–100.0)
Monocytes Absolute: 0.6 10*3/uL (ref 0.1–1.0)
Monocytes Relative: 7 %
Neutro Abs: 6.7 10*3/uL (ref 1.7–7.7)
Neutrophils Relative %: 74 %
Platelets: 234 10*3/uL (ref 150–400)
RBC: 4.03 MIL/uL (ref 3.87–5.11)
RDW: 13.8 % (ref 11.5–15.5)
WBC: 9.1 10*3/uL (ref 4.0–10.5)
nRBC: 0 % (ref 0.0–0.2)

## 2021-11-06 LAB — VITAMIN D 25 HYDROXY (VIT D DEFICIENCY, FRACTURES): Vit D, 25-Hydroxy: 33.25 ng/mL (ref 30–100)

## 2021-11-06 LAB — LACTATE DEHYDROGENASE: LDH: 132 U/L (ref 98–192)

## 2021-11-06 NOTE — Patient Instructions (Signed)
Victoria Mcconnell at [facility] Discharge Instructions  You were seen today by Dr. Delton Coombes & Tarri Abernethy PA-C for your abnormal protein.  As we discussed, your abnormal protein is known as "monoclonal gammopathy of undetermined significance," which is also known as "MGUS."  In about 1% of patients per year, MGUS can progress to a type of cancer known as multiple myeloma.   We will check some additional tests on you today, including... - LABS - X-RAYS - URINE STUDY (24-hour collection kit sent home with you)  FOLLOW-UP APPOINTMENT: Office visit in 2 weeks to discuss results   Thank you for choosing [place] at [facility] to provide your oncology and hematology care.  To afford each patient quality time with our provider, please arrive at least 15 minutes before your scheduled appointment time.   If you have a lab appointment with the [facility] please come in thru the Main Entrance and check in at the main information desk.  You need to re-schedule your appointment should you arrive 10 or more minutes late.  We strive to give you quality time with our providers, and arriving late affects you and other patients whose appointments are after yours.  Also, if you no show three or more times for appointments you may be dismissed from the clinic at the providers discretion.     Again, thank you for choosing [facility].  Our hope is that these requests will decrease the amount of time that you wait before being seen by our physicians.       _____________________________________________________________  Should you have questions after your visit to [facility], please contact our office at [phone] and follow the prompts.  Our office hours are 8:00 a.m. and 4:30 p.m. Monday - Friday.  Please note that voicemails left after 4:00 p.m. may not be returned until the following business day.  We are closed weekends and  major holidays.  You do have access to a nurse 24-7, just call the main number to the clinic [phone] and do not press any options, hold on the line and a nurse will answer the phone.    For prescription refill requests, have your pharmacy contact our office and allow 72 hours.    Due to Covid, you will need to wear a mask upon entering the hospital. If you do not have a mask, a mask will be given to you at the Main Entrance upon arrival. For doctor visits, patients may have 1 support person age 30 or older with them. For treatment visits, patients can not have anyone with them due to social distancing guidelines and our immunocompromised population.

## 2021-11-07 LAB — PTH, INTACT AND CALCIUM
Calcium, Total (PTH): 10.2 mg/dL (ref 8.7–10.3)
PTH: 35 pg/mL (ref 15–65)

## 2021-11-07 LAB — BETA 2 MICROGLOBULIN, SERUM: Beta-2 Microglobulin: 2 mg/L (ref 0.6–2.4)

## 2021-11-10 ENCOUNTER — Other Ambulatory Visit (HOSPITAL_COMMUNITY): Payer: Self-pay

## 2021-11-10 DIAGNOSIS — Z87891 Personal history of nicotine dependence: Secondary | ICD-10-CM | POA: Diagnosis not present

## 2021-11-10 DIAGNOSIS — N1831 Chronic kidney disease, stage 3a: Secondary | ICD-10-CM | POA: Diagnosis not present

## 2021-11-10 DIAGNOSIS — I129 Hypertensive chronic kidney disease with stage 1 through stage 4 chronic kidney disease, or unspecified chronic kidney disease: Secondary | ICD-10-CM | POA: Diagnosis not present

## 2021-11-10 DIAGNOSIS — D472 Monoclonal gammopathy: Secondary | ICD-10-CM

## 2021-11-10 DIAGNOSIS — E1122 Type 2 diabetes mellitus with diabetic chronic kidney disease: Secondary | ICD-10-CM | POA: Diagnosis not present

## 2021-11-12 LAB — UPEP/UIFE/LIGHT CHAINS/TP, 24-HR UR
% BETA, Urine: 0 %
ALPHA 1 URINE: 0 %
Albumin, U: 0 %
Alpha 2, Urine: 0 %
Free Kappa Lt Chains,Ur: 8.14 mg/L (ref 1.17–86.46)
Free Kappa/Lambda Ratio: 5.32 (ref 1.83–14.26)
Free Lambda Lt Chains,Ur: 1.53 mg/L (ref 0.27–15.21)
GAMMA GLOBULIN URINE: 0 %
Total Protein, Urine-Ur/day: 113 mg/24 hr (ref 30–150)
Total Protein, Urine: 4.9 mg/dL
Total Volume: 2300

## 2021-11-16 ENCOUNTER — Other Ambulatory Visit: Payer: Self-pay | Admitting: Family Medicine

## 2021-11-18 DIAGNOSIS — H52203 Unspecified astigmatism, bilateral: Secondary | ICD-10-CM | POA: Diagnosis not present

## 2021-11-18 DIAGNOSIS — H5213 Myopia, bilateral: Secondary | ICD-10-CM | POA: Diagnosis not present

## 2021-11-18 DIAGNOSIS — H401131 Primary open-angle glaucoma, bilateral, mild stage: Secondary | ICD-10-CM | POA: Diagnosis not present

## 2021-11-18 DIAGNOSIS — H524 Presbyopia: Secondary | ICD-10-CM | POA: Diagnosis not present

## 2021-11-18 DIAGNOSIS — H25813 Combined forms of age-related cataract, bilateral: Secondary | ICD-10-CM | POA: Diagnosis not present

## 2021-11-18 DIAGNOSIS — E1136 Type 2 diabetes mellitus with diabetic cataract: Secondary | ICD-10-CM | POA: Diagnosis not present

## 2021-11-20 ENCOUNTER — Encounter: Payer: Self-pay | Admitting: Family Medicine

## 2021-11-20 ENCOUNTER — Ambulatory Visit (INDEPENDENT_AMBULATORY_CARE_PROVIDER_SITE_OTHER): Payer: Medicare Other | Admitting: Family Medicine

## 2021-11-20 ENCOUNTER — Other Ambulatory Visit: Payer: Self-pay

## 2021-11-20 VITALS — BP 123/74 | HR 80 | Resp 16 | Ht 68.0 in | Wt 199.0 lb

## 2021-11-20 DIAGNOSIS — F329 Major depressive disorder, single episode, unspecified: Secondary | ICD-10-CM

## 2021-11-20 DIAGNOSIS — Z0000 Encounter for general adult medical examination without abnormal findings: Secondary | ICD-10-CM | POA: Diagnosis not present

## 2021-11-20 DIAGNOSIS — F411 Generalized anxiety disorder: Secondary | ICD-10-CM

## 2021-11-20 MED ORDER — ESCITALOPRAM OXALATE 20 MG PO TABS: 20.0000 mg | ORAL_TABLET | Freq: Every day | ORAL | 3 refills | Status: DC

## 2021-11-20 NOTE — Patient Instructions (Signed)
F/U in 8 weeks,  call if you need me before ? ?New higher dose of citalopram is 20 mg daily ? ?You are referred to therapist , they will cal with your appointment ? ?Please call iif you need me before ? ?I am here for you ? ?Thanks for choosing [facility], we consider it a privelige to serve you. ? ? ? ?

## 2021-11-23 ENCOUNTER — Encounter: Payer: Self-pay | Admitting: Family Medicine

## 2021-11-23 DIAGNOSIS — Z1211 Encounter for screening for malignant neoplasm of colon: Secondary | ICD-10-CM | POA: Insufficient documentation

## 2021-11-23 DIAGNOSIS — F32A Depression, unspecified: Secondary | ICD-10-CM | POA: Insufficient documentation

## 2021-11-23 DIAGNOSIS — Z0000 Encounter for general adult medical examination without abnormal findings: Secondary | ICD-10-CM | POA: Insufficient documentation

## 2021-11-23 NOTE — Assessment & Plan Note (Signed)
Increase lexapro and refer to therapy ?

## 2021-11-23 NOTE — Assessment & Plan Note (Signed)
Direct correlation with new dx of MGUS, increase lexapro and refer to therapy ?

## 2021-11-23 NOTE — Assessment & Plan Note (Signed)

## 2021-11-23 NOTE — Progress Notes (Signed)
? ? ?  Victoria Mcconnell     MRN: [id number]      DOB: 06-Mar-1946 ? ?HPI: ?Patient is in for annual physical exam. ?New dx of MGUS in December, has her stressed , afraid, anxious and depressed, she recently lost a dear sister to multiple myeloma ?Not suicidal or homicidal, interested in therapy ?Recent labs,  are reviewed. ?Immunization is reviewed , and  updated if needed. ? ? ?PE: ?BP 123/74   Pulse 80   Resp 16   Ht _0  (1.727 m)   Wt 199 lb (90.3 kg)   SpO2 98%   BMI 30.26 kg/m?  ? ?Pleasant  female, alert and oriented x 3, in no cardio-pulmonary distress.Tearful ?Afebrile. ?HEENT ?No facial trauma or asymetry. Sinuses non tender.  ?Extra occullar muscles intact.[name] Kitchen ?External ears normal, . ?Neck: supple, no adenopathy,JVD or thyromegaly.No bruits. ? ?Chest: ?Clear to ascultation bilaterally.No crackles or wheezes. ?Non tender to palpation ? ?Cardiovascular system; ?Heart sounds normal,  S1 and  S2 ,no S3.  No murmur, or thrill. ?Apical beat not displaced ?Peripheral pulses normal. ? ?Abdomen: ?Soft, non tender, nd. ? ? ? ? ? ?Musculoskeletal exam: ?Full ROM of spine, hips , shoulders and knees. ?No deformity ,swelling or crepitus noted. ?No muscle wasting or atrophy.  ? ?Neurologic: ?Cranial nerves 2 to 12 intact. ?Power, tone ,sensation  normal throughout. ?No disturbance in gait. No tremor. ? ?Skin: ?Intact, no ulceration, erythema , scaling or rash noted. ?Pigmentation normal throughout ? ?Psych; ?Depressed, anxious and tearful, not suicidal or homicidal. Judgement and concentration normal ? ? ?Assessment & Plan:  ?Annual physical exam ?Annual exam as documented. ?Counseling done  re healthy lifestyle involving commitment to 150 minutes exercise per week, heart healthy diet, and attaining healthy weight.The importance of adequate sleep also discussed. ?Regular seat belt use and home safety, is also discussed. ?Changes in health habits are decided on by the patient with goals and time frames  set for  achieving them. ?Immunization and cancer screening needs are specifically addressed at this visit. ? ? ?GAD (generalized anxiety disorder) ?Increase lexapro and refer to therapy ? ?Depression ?Direct correlation with new dx of MGUS, increase lexapro and refer to therapy ? ?

## 2021-11-24 NOTE — Progress Notes (Unsigned)
[place] [place], [place] [address]   CLINIC:  Medical Oncology/Hematology  PCP:  Fayrene Helper, MD [address] [name] [address] [phone]   REASON FOR VISIT:  Follow-up for MGUS  PRIOR THERAPY: None  CURRENT THERAPY: Under work-up  INTERVAL HISTORY:  Ms. Donnell 76 y.o. female returns for routine follow-up of her recently diagnosed MGUS.  She was seen for initial consultation by Tarri Abernethy PA-C and Dr. Delton Coombes on 11/06/2021.  At today's visit, she reports feeling ***.  She denies any changes in her health since her consultation a few weeks ago, apart from some increased anxiety and depression regarding her diagnosis, especially in light of the fact that her sister has multiple myeloma.  *** She denies any new bone pain or recent fractures. She denies any B symptoms such as fever, chills, night sweats, unintentional weight loss.  No abnormal fatigue.  No new neurologic symptoms such as tinnitus, new-onset hearing loss, blurred vision, headache, or dizziness.  Denies any numbness or tingling in hands or feet. No thromboembolic events since her last visit.  No new masses or lymphadenopathy per her report.  She has ***% energy and ***% appetite. She endorses that she is maintaining a stable weight.    REVIEW OF SYSTEMS:  Review of Systems - Oncology    PAST MEDICAL/SURGICAL HISTORY:  Past Medical History:  Diagnosis Date   Allergy    seasonal allergies   Anxiety    Arthritis    Depression, major, single episode, moderate ([place]) 07/01/2015   Diabetes mellitus    Glaucoma    Hyperlipidemia    Hypertension    Osteopenia    Past Surgical History:  Procedure Laterality Date   ABDOMINAL HYSTERECTOMY     SPINE SURGERY N/A 11/11/2020   lam facetectomy & foramotomy 1 vrt sgm lumbar     SOCIAL HISTORY:  Social History   Socioeconomic History   Marital status: Widowed    Spouse name: Not on file    Number of children: Not on file   Years of education: Not on file   Highest education level: Not on file  Occupational History   Not on file  Tobacco Use   Smoking status: Former    Types: Cigarettes    Quit date: 09/08/1981    Years since quitting: 40.2   Smokeless tobacco: Never  Substance and Sexual Activity   Alcohol use: No   Drug use: No   Sexual activity: Not Currently  Other Topics Concern   Not on file  Social History Narrative   Not on file   Social Determinants of Health   Financial Resource Strain: Not on file  Food Insecurity: Not on file  Transportation Needs: Not on file  Physical Activity: Not on file  Stress: Not on file  Social Connections: Not on file  Intimate Partner Violence: Not on file    FAMILY HISTORY:  Family History  Problem Relation Age of Onset   Arthritis Mother    Cancer Sister    Diabetes Sister    Hyperlipidemia Sister    Hypertension Sister     CURRENT MEDICATIONS:  Outpatient Encounter Medications as of 11/25/2021  Medication Sig Note   acetaminophen (TYLENOL) 500 MG tablet Take 500 mg by mouth 2 (two) times daily as needed.    amLODipine (NORVASC) 5 MG tablet Take 1 tablet (5 mg total) by mouth daily.    brimonidine (ALPHAGAN) 0.2 % ophthalmic  solution Place 1 drop into both eyes in the morning and at bedtime.    Cholecalciferol (VITAMIN D) 50 MCG (2000 UT) tablet Take 1 tablet (2,000 Units total) by mouth daily. 10/22/2020: Takes 2 daily   clindamycin-benzoyl peroxide (BENZACLIN) gel Apply topically to the entire face every morning    dorzolamide-timolol (COSOPT) 22.3-6.8 MG/ML ophthalmic solution Place 1 drop into both eyes 2 times daily.    escitalopram (LEXAPRO) 20 MG tablet Take 1 tablet (20 mg total) by mouth daily.    gabapentin (NEURONTIN) 100 MG capsule TAKE 1 TO 2 CAPSULES BY MOUTH AT BEDTIME    glucose blood (ONETOUCH ULTRA) test strip USE AS DIRECTED TO MONITOR  FASTING BLOOD SUGAR ONCE  DAILY    Lancets MISC Please  dispense based on patient and insurance preference. Use as directed to monitor FSBS 1x daily. Dx: E11.9.    LINZESS 72 MCG capsule TAKE 1 CAPSULE BY MOUTH  DAILY BEFORE BREAKFAST    montelukast (SINGULAIR) 10 MG tablet TAKE 1 TABLET BY MOUTH AT  BEDTIME    tretinoin (RETIN-A) 0.025 % cream SMARTSIG:sparingly Topical Every Night (Patient not taking: Reported on 11/20/2021)    valsartan-hydrochlorothiazide (DIOVAN-HCT) 160-25 MG tablet TAKE 1 TABLET BY MOUTH  DAILY    No facility-administered encounter medications on file as of 11/25/2021.    ALLERGIES:  No Known Allergies   PHYSICAL EXAM:  ECOG PERFORMANCE STATUS: {CHL ONC ECOG [phone]}  There were no vitals filed for this visit. There were no vitals filed for this visit. Physical Exam   LABORATORY DATA:  I have reviewed the labs as listed.  CBC    Component Value Date/Time   WBC 9.1 11/06/2021 1045   RBC 4.03 11/06/2021 1045   HGB 11.4 (L) 11/06/2021 1045   HCT 35.5 (L) 11/06/2021 1045   PLT 234 11/06/2021 1045   MCV 88.1 11/06/2021 1045   MCH 28.3 11/06/2021 1045   MCHC 32.1 11/06/2021 1045   RDW 13.8 11/06/2021 1045   LYMPHSABS 1.6 11/06/2021 1045   MONOABS 0.6 11/06/2021 1045   EOSABS 0.1 11/06/2021 1045   BASOSABS 0.1 11/06/2021 1045   CMP Latest Ref Rng & Units 11/06/2021 07/09/2021 03/27/2021  Glucose 70 - 99 mg/dL - 113(H) 112(H)  BUN 8 - 27 mg/dL - 19 14  Creatinine 0.57 - 1.00 mg/dL - 1.18(H) 1.05(H)  Sodium 134 - 144 mmol/L - 141 143  Potassium 3.5 - 5.2 mmol/L - 3.9 3.8  Chloride 96 - 106 mmol/L - 105 106  CO2 20 - 29 mmol/L - 21 23  Calcium 8.7 - 10.3 mg/dL 10.2 10.2 10.2  Total Protein 6.0 - 8.5 g/dL - 7.5 -  Total Bilirubin 0.0 - 1.2 mg/dL - 0.3 -  Alkaline Phos 44 - 121 IU/L - 90 -  AST 0 - 40 IU/L - 17 -  ALT 0 - 32 IU/L - 8 -    DIAGNOSTIC IMAGING:  I have independently reviewed the relevant imaging and discussed with the patient.  ASSESSMENT & PLAN: 1.  MGUS, IgG lambda - Seen at the  request of her nephrologist (Dr. Madelon Lips) for work-up of possible MGUS. - Laboratory work-up sent by her nephrologist (10/14/2021) shows M spike of 1.0 and immunofixation showing IgG lambda monoclonal protein.  Kappa light chains 27.4 with lambda light chains 32.1 and ratio 0.85. - Additional labs (10/14/2021): Creatinine 1.05, calcium 10.5 - CBC (11/06/2021): Hgb 11.4/MCV 88.1 - 24-hour urine with UPEP/U IFE was negative - Normal beta-2 microglobulin.  Normal  LDH. - Skeletal survey (11/06/2021) negative for any suspicious osseous lesions - She denies any new bone pain, B symptoms, or neurologic changes. - Patient's sister has multiple myeloma in remission after stem cell transplant - Patient meets diagnostic criteria for MGUS only.  No smoldering myeloma or multiple myeloma at this time. - PLAN: No indication for treatment or bone marrow biopsy at this time. - We will continue close monitoring with repeat labs in 6 months and annual skeletal survey   2.  Borderline hypercalcemia - Additional labs (11/06/2021) showed normal calcium 10.2 with normal intact PTH 35 - Normal vitamin D 33.25 - PLAN: Continue to monitor calcium with repeat CMP in 6 months  3.  Other history - PMH: CKD stage IIIa, hypertension, type 2 diabetes mellitus - SOCIAL: She is retired.  She is a former smoker, who smokes cigarettes x5 years but quit 40+ years ago.  She denies any alcohol or illicit drugs. - FAMILY: Patient's sister had multiple myeloma, underwent SCT but relapsed and is now on maintenance medications.  Patient's father had prostate cancer.   PLAN SUMMARY & DISPOSITION: ***  All questions were answered. The patient knows to call the clinic with any problems, questions or concerns.  Medical decision making: ***  Time spent on visit: I spent {CHL ONC TIME VISIT - GYJEH:[id number]} counseling the patient face to face. The total time spent in the appointment was {CHL ONC TIME VISIT - ZCHYI:[id number]}  and more than 50% was on counseling.   Harriett Rush, PA-C  ***

## 2021-11-25 ENCOUNTER — Other Ambulatory Visit: Payer: Self-pay

## 2021-11-25 ENCOUNTER — Inpatient Hospital Stay (HOSPITAL_COMMUNITY): Payer: Medicare Other | Attending: Hematology | Admitting: Physician Assistant

## 2021-11-25 VITALS — BP 137/83 | HR 85 | Temp 97.3°F | Resp 19 | Ht 68.0 in | Wt 199.9 lb

## 2021-11-25 DIAGNOSIS — Z87891 Personal history of nicotine dependence: Secondary | ICD-10-CM | POA: Diagnosis not present

## 2021-11-25 DIAGNOSIS — I129 Hypertensive chronic kidney disease with stage 1 through stage 4 chronic kidney disease, or unspecified chronic kidney disease: Secondary | ICD-10-CM | POA: Diagnosis not present

## 2021-11-25 DIAGNOSIS — D472 Monoclonal gammopathy: Secondary | ICD-10-CM

## 2021-11-25 DIAGNOSIS — E1122 Type 2 diabetes mellitus with diabetic chronic kidney disease: Secondary | ICD-10-CM | POA: Insufficient documentation

## 2021-11-25 DIAGNOSIS — N1831 Chronic kidney disease, stage 3a: Secondary | ICD-10-CM | POA: Insufficient documentation

## 2021-11-25 NOTE — Patient Instructions (Signed)
[place] at [facility] ?Discharge Instructions ? ?You were seen today by Tarri Abernethy PA-C for your MGUS (abnormal protein).  You do NOT have any signs of myeloma cancer at this time.  We will check your labs again in 6 months to make sure your labs are staying stable.   ? ?LABS: Return in 6 months for repeat labs  ? ?FOLLOW-UP APPOINTMENT: Office visit in 6 months, after labs ? ? ?Thank you for choosing [place] at [facility] to provide your oncology and hematology care.  To afford each patient quality time with our provider, please arrive at least 15 minutes before your scheduled appointment time.  ? ?If you have a lab appointment with the [facility] please come in thru the Main Entrance and check in at the main information desk. ? ?You need to re-schedule your appointment should you arrive 10 or more minutes late.  We strive to give you quality time with our providers, and arriving late affects you and other patients whose appointments are after yours.  Also, if you no show three or more times for appointments you may be dismissed from the clinic at the providers discretion.     ?Again, thank you for choosing [facility].  Our hope is that these requests will decrease the amount of time that you wait before being seen by our physicians.       ?_____________________________________________________________ ? ?Should you have questions after your visit to [facility], please contact our office at [phone] and follow the prompts.  Our office hours are 8:00 a.m. and 4:30 p.m. Monday - Friday.  Please note that voicemails left after 4:00 p.m. may not be returned until the following business day.  We are closed weekends and major holidays.  You do have access to a nurse 24-7, just call the main number to the clinic [phone] and do not press any options, hold on the line and a nurse will answer the phone.   ? ?For  prescription refill requests, have your pharmacy contact our office and allow 72 hours.   ? ?Due to Covid, you will need to wear a mask upon entering the hospital. If you do not have a mask, a mask will be given to you at the Main Entrance upon arrival. For doctor visits, patients may have 1 support person age 59 or older with them. For treatment visits, patients can not have anyone with them due to social distancing guidelines and our immunocompromised population.  ? ? ? ?

## 2021-12-12 ENCOUNTER — Other Ambulatory Visit: Payer: Self-pay | Admitting: Family Medicine

## 2021-12-15 ENCOUNTER — Ambulatory Visit (INDEPENDENT_AMBULATORY_CARE_PROVIDER_SITE_OTHER): Payer: Medicare Other

## 2021-12-15 DIAGNOSIS — Z0000 Encounter for general adult medical examination without abnormal findings: Secondary | ICD-10-CM | POA: Diagnosis not present

## 2021-12-15 NOTE — Patient Instructions (Signed)
?  Victoria Mcconnell , ?Thank you for taking time to come for your Medicare Wellness Visit. I appreciate your ongoing commitment to your health goals. Please review the following plan we discussed and let me know if I can assist you in the future.  ? ?These are the goals we discussed: ? Goals   ? ?  Patient Stated   ?  Patient states that her goal is to try to stay well ?  ? ?  ?  ?This is a list of the screening recommended for you and due dates:  ?Health Maintenance  ?Topic Date Due  ? Tetanus Vaccine  Never done  ? Hemoglobin A1C  01/07/2022  ? Flu Shot  04/14/2022  ? Mammogram  08/07/2023  ? Colon Cancer Screening  05/06/2031  ? Pneumonia Vaccine  Completed  ? DEXA scan (bone density measurement)  Completed  ? COVID-19 Vaccine  Completed  ? Hepatitis C Screening: USPSTF Recommendation to screen - Ages 8-79 yo.  Completed  ? Zoster (Shingles) Vaccine  Completed  ? HPV Vaccine  Aged Out  ? Complete foot exam   Discontinued  ? Eye exam for diabetics  Discontinued  ?  ?

## 2021-12-15 NOTE — Progress Notes (Signed)
? ?Subjective:  ? Victoria Mcconnell is a 76 y.o. female who presents for Medicare Annual (Subsequent) preventive examination. ?I connected with  XENA PROPST on 12/15/21 by a audio enabled telemedicine application and verified that I am speaking with the correct person using two identifiers. ? ?Patient Location: Home ? ?Provider Location: Office/Clinic ? ?I discussed the limitations of evaluation and management by telemedicine. The patient expressed understanding and agreed to proceed.  ?Review of Systems    ? ?  ? ?   ?Objective:  ?  ?There were no vitals filed for this visit. ?There is no height or weight on file to calculate BMI. ? ? ?  11/06/2021  ?  8:46 AM 09/13/2019  ? 10:13 AM 08/31/2018  ?  2:53 PM 06/23/2018  ? 10:36 AM 06/21/2018  ?  2:32 PM 06/15/2018  ? 11:06 AM 06/07/2018  ? 11:17 AM  ?Advanced Directives  ?Does Patient Have a Medical Advance Directive? No No No No No No No  ?Does patient want to make changes to medical advance directive?       No - Patient declined  ?Would patient like information on creating a medical advance directive? No - Patient declined Yes (MAU/Ambulatory/Procedural Areas - Information given) Yes (MAU/Ambulatory/Procedural Areas - Information given) No - Patient declined No - Patient declined No - Patient declined No - Patient declined  ? ? ?Current Medications (verified) ?Outpatient Encounter Medications as of 12/15/2021  ?Medication Sig  ? acetaminophen (TYLENOL) 500 MG tablet Take 500 mg by mouth 2 (two) times daily as needed.  ? amLODipine (NORVASC) 5 MG tablet Take 1 tablet (5 mg total) by mouth daily.  ? brimonidine (ALPHAGAN) 0.2 % ophthalmic solution Place 1 drop into both eyes in the morning and at bedtime.  ? Cholecalciferol (VITAMIN D) 50 MCG (2000 UT) tablet Take 1 tablet (2,000 Units total) by mouth daily.  ? clindamycin-benzoyl peroxide (BENZACLIN) gel Apply topically to the entire face every morning  ? dorzolamide-timolol (COSOPT) 22.3-6.8 MG/ML ophthalmic solution  Place 1 drop into both eyes 2 times daily.  ? escitalopram (LEXAPRO) 20 MG tablet TAKE 1 TABLET BY MOUTH EVERY DAY  ? gabapentin (NEURONTIN) 100 MG capsule TAKE 1 TO 2 CAPSULES BY MOUTH AT BEDTIME  ? glucose blood (ONETOUCH ULTRA) test strip USE AS DIRECTED TO MONITOR  FASTING BLOOD SUGAR ONCE  DAILY  ? Lancets MISC Please dispense based on patient and insurance preference. Use as directed to monitor FSBS 1x daily. Dx: E11.9.  ? LINZESS 72 MCG capsule TAKE 1 CAPSULE BY MOUTH  DAILY BEFORE BREAKFAST  ? montelukast (SINGULAIR) 10 MG tablet TAKE 1 TABLET BY MOUTH AT  BEDTIME  ? tretinoin (RETIN-A) 0.025 % cream SMARTSIG:sparingly Topical Every Night (Patient not taking: Reported on 11/20/2021)  ? valsartan-hydrochlorothiazide (DIOVAN-HCT) 160-25 MG tablet TAKE 1 TABLET BY MOUTH  DAILY  ? ?No facility-administered encounter medications on file as of 12/15/2021.  ? ? ?Allergies (verified) ?Patient has no known allergies.  ? ?History: ?Past Medical History:  ?Diagnosis Date  ? Allergy   ? seasonal allergies  ? Anxiety   ? Arthritis   ? Depression, major, single episode, moderate ([place]) 07/01/2015  ? Diabetes mellitus   ? Glaucoma   ? Hyperlipidemia   ? Hypertension   ? Osteopenia   ? ?Past Surgical History:  ?Procedure Laterality Date  ? ABDOMINAL HYSTERECTOMY    ? SPINE SURGERY N/A 11/11/2020  ? lam facetectomy & foramotomy 1 vrt sgm lumbar  ? ?Family History  ?  Problem Relation Age of Onset  ? Arthritis Mother   ? Cancer Sister   ? Diabetes Sister   ? Hyperlipidemia Sister   ? Hypertension Sister   ? ?Social History  ? ?Socioeconomic History  ? Marital status: Widowed  ?  Spouse name: Not on file  ? Number of children: Not on file  ? Years of education: Not on file  ? Highest education level: Not on file  ?Occupational History  ? Not on file  ?Tobacco Use  ? Smoking status: Former  ?  Types: Cigarettes  ?  Quit date: 09/08/1981  ?  Years since quitting: 40.2  ? Smokeless tobacco: Never  ?Substance and Sexual Activity  ?  Alcohol use: No  ? Drug use: No  ? Sexual activity: Not Currently  ?Other Topics Concern  ? Not on file  ?Social History Narrative  ? Not on file  ? ?Social Determinants of Health  ? ?Financial Resource Strain: Not on file  ?Food Insecurity: Not on file  ?Transportation Needs: Not on file  ?Physical Activity: Not on file  ?Stress: Not on file  ?Social Connections: Not on file  ? ? ?Tobacco Counseling ?Counseling given: Not Answered ? ? ?Clinical Intake: ? ?  ? ?  ? ?  ? ?  ? ?  ? ?Diabetic?no ? ?  ? ?  ? ? ?Activities of Daily Living ? ?  02/20/2021  ?  2:58 PM  ?In your present state of health, do you have any difficulty performing the following activities:  ?Hearing? 0  ?Vision? 0  ?Difficulty concentrating or making decisions? 0  ?Walking or climbing stairs? 0  ?Dressing or bathing? 0  ?Doing errands, shopping? 0  ? ? ?Patient Care Team: ?Fayrene Helper, MD as PCP - General (Family Medicine) ?Edythe Clarity, [facility] as Pharmacist (Pharmacist) ? ?Indicate any recent Medical Services you may have received from other than [facility] providers in the past year (date may be approximate). ? ?   ?Assessment:  ? This is a routine wellness examination for [facility]. ? ?Hearing/Vision screen ?No results found. ? ?Dietary issues and exercise activities discussed: ?  ? ? Goals Addressed   ?None ?  ? ?Depression Screen ? ?  11/20/2021  ? 12:06 PM 11/20/2021  ? 11:26 AM 08/21/2021  ? 11:19 AM 07/09/2021  ?  8:03 AM 03/27/2021  ?  9:23 AM 02/20/2021  ?  2:58 PM 10/22/2020  ?  2:43 PM  ?PHQ 2/9 Scores  ?PHQ - 2 Score 4 0 0 0 0 0 0  ?PHQ- 9 Score 7  0 0 0 0   ?  ?Fall Risk ? ?  11/20/2021  ? 11:26 AM 08/21/2021  ? 11:18 AM 07/09/2021  ?  8:03 AM 03/27/2021  ?  9:23 AM 02/20/2021  ?  2:58 PM  ?Fall Risk   ?Falls in the past year? 0 0 0 0 0  ?Number falls in past yr: 0 0  0 0  ?Injury with Fall? 0 0  0 0  ?Risk for fall due to :  No Fall Risks  No Fall Risks No Fall Risks  ?Follow up  Falls evaluation completed  Falls evaluation completed Falls  evaluation completed  ? ? ?FALL RISK PREVENTION PERTAINING TO THE HOME: ? ?Any stairs in or around the home? Yes  ?If so, are there any without handrails? No  ?Home free of loose throw rugs in walkways, pet beds, electrical cords, etc? Yes  ?Adequate lighting  in your home to reduce risk of falls? Yes  ? ?ASSISTIVE DEVICES UTILIZED TO PREVENT FALLS: ? ?Life alert? No  ?Use of a cane, walker or w/c? No  ?Grab bars in the bathroom? Yes  ?Shower chair or bench in shower? Yes  ?Elevated toilet seat or a handicapped toilet? No  ? ?TIMED UP AND GO: ? ?Was the test performed? No .  ?Length of time to ambulate 10 feet:  sec.  ? ? ? ?Cognitive Function: ?  ?  ?  ? ?Immunizations ?Immunization History  ?Administered Date(s) Administered  ? Fluad Quad(high Dose 65+) 05/23/2019, 06/27/2020, 07/09/2021  ? Influenza, High Dose Seasonal PF 07/07/2017  ? Influenza,inj,Quad PF,6+ Mos 07/01/2015, 05/18/2018  ? Influenza-Unspecified 08/16/2002, 01/20/2014, 07/06/2016  ? Moderna Covid-19 Vaccine Bivalent Booster 71yr & up 12/31/2020  ? Moderna SARS-COV2 Booster Vaccination 12/22/2019  ? Moderna Sars-Covid-2 Vaccination 10/22/2019, 11/22/2019, 05/24/2020  ? Pneumococcal Conjugate-13 08/27/2015  ? Pneumococcal Polysaccharide-23 09/08/2013, 01/20/2014, 02/20/2021  ? Zoster Recombinat (Shingrix) 12/21/2020, 03/29/2021  ? Zoster, Live 09/13/2013  ? ? ?TDAP status: Due, Education has been provided regarding the importance of this vaccine. Advised may receive this vaccine at local pharmacy or Health Dept. Aware to provide a copy of the vaccination record if obtained from local pharmacy or Health Dept. Verbalized acceptance and understanding. ? ?Flu Vaccine status: Up to date ? ?Pneumococcal vaccine status: Up to date ? ?Covid-19 vaccine status: Completed vaccines ? ?Qualifies for Shingles Vaccine? Yes   ?Zostavax completed Yes   ?Shingrix Completed?: Yes ? ?Screening Tests ?Health Maintenance  ?Topic Date Due  ? TETANUS/TDAP  Never done  ?  HEMOGLOBIN A1C  01/07/2022  ? INFLUENZA VACCINE  04/14/2022  ? MAMMOGRAM  08/07/2023  ? COLONOSCOPY (Pts 45-462yrInsurance coverage will need to be confirmed)  05/06/2031  ? Pneumonia Vaccine 6571Years old  Completed

## 2022-01-20 ENCOUNTER — Ambulatory Visit (INDEPENDENT_AMBULATORY_CARE_PROVIDER_SITE_OTHER): Payer: Medicare Other | Admitting: Family Medicine

## 2022-01-20 ENCOUNTER — Encounter: Payer: Self-pay | Admitting: Family Medicine

## 2022-01-20 VITALS — BP 124/80 | HR 71 | Ht 68.0 in | Wt 200.1 lb

## 2022-01-20 DIAGNOSIS — H409 Unspecified glaucoma: Secondary | ICD-10-CM | POA: Diagnosis not present

## 2022-01-20 DIAGNOSIS — E669 Obesity, unspecified: Secondary | ICD-10-CM | POA: Diagnosis not present

## 2022-01-20 DIAGNOSIS — E785 Hyperlipidemia, unspecified: Secondary | ICD-10-CM | POA: Diagnosis not present

## 2022-01-20 DIAGNOSIS — E559 Vitamin D deficiency, unspecified: Secondary | ICD-10-CM

## 2022-01-20 DIAGNOSIS — I10 Essential (primary) hypertension: Secondary | ICD-10-CM

## 2022-01-20 DIAGNOSIS — Z1211 Encounter for screening for malignant neoplasm of colon: Secondary | ICD-10-CM | POA: Diagnosis not present

## 2022-01-20 DIAGNOSIS — R7303 Prediabetes: Secondary | ICD-10-CM | POA: Diagnosis not present

## 2022-01-20 DIAGNOSIS — F329 Major depressive disorder, single episode, unspecified: Secondary | ICD-10-CM

## 2022-01-20 LAB — POCT GLYCOSYLATED HEMOGLOBIN (HGB A1C): HbA1c, POC (prediabetic range): 5.7 % (ref 5.7–6.4)

## 2022-01-20 NOTE — Progress Notes (Signed)
? ?Victoria Mcconnell     MRN: [id number]      DOB: Jan 26, 1946 ? ? ?HPI ?Victoria Mcconnell is here for follow up and re-evaluation of chronic medical conditions, medication management and review of any available recent lab and radiology data.  ?Preventive health is updated, specifically  Cancer screening and Immunization.   ?Questions or concerns regarding consultations or procedures which the PT has had in the interim are  addressed. ?The PT denies any adverse reactions to current medications since the last visit.  ?There are no new concerns.  ?There are no specific complaints  ? ?ROS ?Denies recent fever or chills. ?Denies sinus pressure, nasal congestion, ear pain or sore throat. ?Denies chest congestion, productive cough or wheezing. ?Denies chest pains, palpitations and leg swelling ?Denies abdominal pain, nausea, vomiting,diarrhea or constipation.   ?Denies dysuria, frequency, hesitancy or incontinence. ?Denies joint pain, swelling and limitation in mobility. ?Denies headaches, seizures, numbness, or tingling. ?Denies depression, anxiety or insomnia. ?Denies skin break down or rash. ? ? ?PE ? ?BP 124/80   Pulse 71   Ht '5\' 8"'$  (1.727 m)   Wt 200 lb 1.3 oz (90.8 kg)   SpO2 97%   BMI 30.42 kg/m?  ? ?Patient alert and oriented and in no cardiopulmonary distress. ? ?HEENT: No facial asymmetry, EOMI,     Neck supple . ? ?Chest: Clear to auscultation bilaterally. ? ?CVS: S1, S2 no murmurs, no S3.Regular rate. ? ?ABD: Soft non tender.  ? ?Ext: No edema ? ?MS: Adequate ROM spine, shoulders, hips and knees. ? ?Skin: Intact, no ulcerations or rash noted. ? ?Psych: Good eye contact, normal affect. Memory intact not anxious or depressed appearing. ? ?CNS: CN 2-12 intact, power,  normal throughout.no focal deficits noted. ? ? ?Assessment & Plan ? ?Depression ?Controlled, no change in medication ? ? ?Essential (primary) hypertension ?Controlled, no change in medication ?DASH diet and commitment to daily physical activity for a  minimum of 30 minutes discussed and encouraged, as a part of hypertension management. ?The importance of attaining a healthy weight is also discussed. ? ? ?  01/20/2022  ? 11:38 AM 01/20/2022  ? 11:14 AM 11/25/2021  ? 10:05 AM 11/20/2021  ? 11:25 AM 11/06/2021  ?  8:36 AM 07/09/2021  ?  8:01 AM 03/27/2021  ?  9:20 AM  ?BP/Weight  ?Systolic BP 093 818 299 371 135 123 122  ?Diastolic BP 80 69 83 74 88 77 77  ?Wt. (Lbs)  200.08 199.9 199 200.4 213 218  ?BMI  30.42 kg/m2 30.39 kg/m2 30.26 kg/m2 29.68 kg/m2 32.39 kg/m2 33.15 kg/m2  ? ? ? ? ? ?Prediabetes ?Improved ?Patient educated about the importance of limiting  Carbohydrate intake , the need to commit to daily physical activity for a minimum of 30 minutes , and to commit weight loss. ?The fact that changes in all these areas will reduce or eliminate all together the development of diabetes is stressed.  ? ? ?  Latest Ref Rng & Units 01/20/2022  ? 11:36 AM 07/09/2021  ?  9:07 AM 03/27/2021  ? 10:06 AM 02/20/2021  ?  3:42 PM 10/16/2020  ?  9:52 AM  ?Diabetic Labs  ?HbA1c 5.7 - 6.4 % 5.7   6.0    5.9    ? 5.9   6.4    ?Chol 100 - 199 mg/dL  177     161    ?HDL >39 mg/dL  71     69    ?Calc LDL  0 - 99 mg/dL  92     79    ?Triglycerides 0 - 149 mg/dL  74     57    ?Creatinine 0.57 - 1.00 mg/dL  1.18   1.05    1.03    ? ? ?  01/20/2022  ? 11:38 AM 01/20/2022  ? 11:14 AM 11/25/2021  ? 10:05 AM 11/20/2021  ? 11:25 AM 11/06/2021  ?  8:36 AM 07/09/2021  ?  8:01 AM 03/27/2021  ?  9:20 AM  ?BP/Weight  ?Systolic BP 825 053 976 734 135 123 122  ?Diastolic BP 80 69 83 74 88 77 77  ?Wt. (Lbs)  200.08 199.9 199 200.4 213 218  ?BMI  30.42 kg/m2 30.39 kg/m2 30.26 kg/m2 29.68 kg/m2 32.39 kg/m2 33.15 kg/m2  ? ? ?  Latest Ref Rng & Units 09/13/2019  ? 10:30 AM 08/14/2019  ? 12:00 AM  ?Foot/eye exam completion dates  ?Eye Exam No Retinopathy  No Retinopathy       ?Foot Form Completion  Done   ?  ? This result is from an external source.  ? ? ? ? ?Glaucoma ?Managed by Ophthalmology, controlled ? ?Obesity (BMI  30.0-34.9) ?Unchanged ? ?Patient re-educated about  the importance of commitment to a  minimum of 150 minutes of exercise per week as able. ? ?The importance of healthy food choices with portion control discussed, as well as eating regularly and within a 12 hour window most days. ?The need to choose "clean , green" food 50 to 75% of the time is discussed, as well as to make water the primary drink and set a goal of 64 ounces water daily. ? ?  ? ?  01/20/2022  ? 11:14 AM 11/25/2021  ? 10:05 AM 11/20/2021  ? 11:25 AM  ?Weight /BMI  ?Weight 200 lb 1.3 oz 199 lb 14.4 oz 199 lb  ?Height '5\' 8"'$  (1.727 m) '5\' 8"'$  (1.727 m) '5\' 8"'$  (1.727 m)  ?BMI 30.42 kg/m2 30.39 kg/m2 30.26 kg/m2  ? ? ? ? ?

## 2022-01-20 NOTE — Assessment & Plan Note (Signed)
Controlled, no change in medication ?DASH diet and commitment to daily physical activity for a minimum of 30 minutes discussed and encouraged, as a part of hypertension management. ?The importance of attaining a healthy weight is also discussed. ? ? ?  01/20/2022  ? 11:38 AM 01/20/2022  ? 11:14 AM 11/25/2021  ? 10:05 AM 11/20/2021  ? 11:25 AM 11/06/2021  ?  8:36 AM 07/09/2021  ?  8:01 AM 03/27/2021  ?  9:20 AM  ?BP/Weight  ?Systolic BP 197 588 325 498 135 123 122  ?Diastolic BP 80 69 83 74 88 77 77  ?Wt. (Lbs)  200.08 199.9 199 200.4 213 218  ?BMI  30.42 kg/m2 30.39 kg/m2 30.26 kg/m2 29.68 kg/m2 32.39 kg/m2 33.15 kg/m2  ? ? ? ? ?

## 2022-01-20 NOTE — Patient Instructions (Addendum)
F/U in 6  months, call if you need me before ? ?Please get TdAP at your pharmacy ? ?It is important that you exercise regularly at least 30 minutes 5 times a week. If you develop chest pain, have severe difficulty breathing, or feel very tired, stop exercising immediately and seek medical attention  ? ?CONGRATS blood sugar is almost Normal ? ?Fasting lipid, cmp and REGFR, HBA1C, TSH and vit D 5 to 7 days before November appt ? ?Please schedule mammogram at checkout ? ?Blood pressure excellent ? ?No med changes ? ?Thankful good report from Hematology ? ?Thanks for choosing [facility], we consider it a privelige to serve you. ? ? ? ? ?

## 2022-01-20 NOTE — Assessment & Plan Note (Signed)
Improved ?Patient educated about the importance of limiting  Carbohydrate intake , the need to commit to daily physical activity for a minimum of 30 minutes , and to commit weight loss. ?The fact that changes in all these areas will reduce or eliminate all together the development of diabetes is stressed.  ? ? ?  Latest Ref Rng & Units 01/20/2022  ? 11:36 AM 07/09/2021  ?  9:07 AM 03/27/2021  ? 10:06 AM 02/20/2021  ?  3:42 PM 10/16/2020  ?  9:52 AM  ?Diabetic Labs  ?HbA1c 5.7 - 6.4 % 5.7   6.0    5.9    ? 5.9   6.4    ?Chol 100 - 199 mg/dL  177     161    ?HDL >39 mg/dL  71     69    ?Calc LDL 0 - 99 mg/dL  92     79    ?Triglycerides 0 - 149 mg/dL  74     57    ?Creatinine 0.57 - 1.00 mg/dL  1.18   1.05    1.03    ? ? ?  01/20/2022  ? 11:38 AM 01/20/2022  ? 11:14 AM 11/25/2021  ? 10:05 AM 11/20/2021  ? 11:25 AM 11/06/2021  ?  8:36 AM 07/09/2021  ?  8:01 AM 03/27/2021  ?  9:20 AM  ?BP/Weight  ?Systolic BP 712 458 099 833 135 123 122  ?Diastolic BP 80 69 83 74 88 77 77  ?Wt. (Lbs)  200.08 199.9 199 200.4 213 218  ?BMI  30.42 kg/m2 30.39 kg/m2 30.26 kg/m2 29.68 kg/m2 32.39 kg/m2 33.15 kg/m2  ? ? ?  Latest Ref Rng & Units 09/13/2019  ? 10:30 AM 08/14/2019  ? 12:00 AM  ?Foot/eye exam completion dates  ?Eye Exam No Retinopathy  No Retinopathy       ?Foot Form Completion  Done   ?  ? This result is from an external source.  ? ? ? ?

## 2022-01-20 NOTE — Assessment & Plan Note (Signed)
Controlled, no change in medication  

## 2022-01-20 NOTE — Assessment & Plan Note (Signed)
Unchanged ? ?Patient re-educated about  the importance of commitment to a  minimum of 150 minutes of exercise per week as able. ? ?The importance of healthy food choices with portion control discussed, as well as eating regularly and within a 12 hour window most days. ?The need to choose "clean , green" food 50 to 75% of the time is discussed, as well as to make water the primary drink and set a goal of 64 ounces water daily. ? ?  ? ?  01/20/2022  ? 11:14 AM 11/25/2021  ? 10:05 AM 11/20/2021  ? 11:25 AM  ?Weight /BMI  ?Weight 200 lb 1.3 oz 199 lb 14.4 oz 199 lb  ?Height '5\' 8"'$  (1.727 m) '5\' 8"'$  (1.727 m) '5\' 8"'$  (1.727 m)  ?BMI 30.42 kg/m2 30.39 kg/m2 30.26 kg/m2  ? ? ? ?

## 2022-01-20 NOTE — Assessment & Plan Note (Signed)
Managed by Ophthalmology, controlled ?

## 2022-02-09 ENCOUNTER — Other Ambulatory Visit: Payer: Self-pay | Admitting: Family Medicine

## 2022-02-24 ENCOUNTER — Other Ambulatory Visit: Payer: Self-pay

## 2022-02-24 MED ORDER — ESCITALOPRAM OXALATE 20 MG PO TABS: 20.0000 mg | ORAL_TABLET | Freq: Every day | ORAL | 2 refills | Status: DC

## 2022-03-02 ENCOUNTER — Other Ambulatory Visit: Payer: Self-pay

## 2022-03-02 MED ORDER — ESCITALOPRAM OXALATE 20 MG PO TABS: 20.0000 mg | ORAL_TABLET | Freq: Every day | ORAL | 2 refills | Status: DC

## 2022-03-10 ENCOUNTER — Other Ambulatory Visit: Payer: Self-pay | Admitting: Family Medicine

## 2022-03-25 DIAGNOSIS — H401131 Primary open-angle glaucoma, bilateral, mild stage: Secondary | ICD-10-CM | POA: Diagnosis not present

## 2022-05-21 ENCOUNTER — Inpatient Hospital Stay: Payer: Medicare Other | Attending: Physician Assistant

## 2022-05-21 DIAGNOSIS — E1122 Type 2 diabetes mellitus with diabetic chronic kidney disease: Secondary | ICD-10-CM | POA: Insufficient documentation

## 2022-05-21 DIAGNOSIS — I129 Hypertensive chronic kidney disease with stage 1 through stage 4 chronic kidney disease, or unspecified chronic kidney disease: Secondary | ICD-10-CM | POA: Diagnosis not present

## 2022-05-21 DIAGNOSIS — Z90710 Acquired absence of both cervix and uterus: Secondary | ICD-10-CM | POA: Insufficient documentation

## 2022-05-21 DIAGNOSIS — Z87891 Personal history of nicotine dependence: Secondary | ICD-10-CM | POA: Diagnosis not present

## 2022-05-21 DIAGNOSIS — Z807 Family history of other malignant neoplasms of lymphoid, hematopoietic and related tissues: Secondary | ICD-10-CM | POA: Diagnosis not present

## 2022-05-21 DIAGNOSIS — N1831 Chronic kidney disease, stage 3a: Secondary | ICD-10-CM | POA: Diagnosis not present

## 2022-05-21 DIAGNOSIS — D472 Monoclonal gammopathy: Secondary | ICD-10-CM | POA: Diagnosis not present

## 2022-05-21 LAB — CBC WITH DIFFERENTIAL/PLATELET
Abs Immature Granulocytes: 0.02 10*3/uL (ref 0.00–0.07)
Basophils Absolute: 0.1 10*3/uL (ref 0.0–0.1)
Basophils Relative: 1 %
Eosinophils Absolute: 0.1 10*3/uL (ref 0.0–0.5)
Eosinophils Relative: 2 %
HCT: 34.1 % — ABNORMAL LOW (ref 36.0–46.0)
Hemoglobin: 11.3 g/dL — ABNORMAL LOW (ref 12.0–15.0)
Immature Granulocytes: 0 %
Lymphocytes Relative: 20 %
Lymphs Abs: 1.5 10*3/uL (ref 0.7–4.0)
MCH: 29.7 pg (ref 26.0–34.0)
MCHC: 33.1 g/dL (ref 30.0–36.0)
MCV: 89.5 fL (ref 80.0–100.0)
Monocytes Absolute: 0.5 10*3/uL (ref 0.1–1.0)
Monocytes Relative: 7 %
Neutro Abs: 5.1 10*3/uL (ref 1.7–7.7)
Neutrophils Relative %: 70 %
Platelets: 211 10*3/uL (ref 150–400)
RBC: 3.81 MIL/uL — ABNORMAL LOW (ref 3.87–5.11)
RDW: 13.3 % (ref 11.5–15.5)
WBC: 7.3 10*3/uL (ref 4.0–10.5)
nRBC: 0 % (ref 0.0–0.2)

## 2022-05-21 LAB — COMPREHENSIVE METABOLIC PANEL
ALT: 10 U/L (ref 0–44)
AST: 15 U/L (ref 15–41)
Albumin: 3.9 g/dL (ref 3.5–5.0)
Alkaline Phosphatase: 66 U/L (ref 38–126)
Anion gap: 5 (ref 5–15)
BUN: 13 mg/dL (ref 8–23)
CO2: 26 mmol/L (ref 22–32)
Calcium: 9.9 mg/dL (ref 8.9–10.3)
Chloride: 108 mmol/L (ref 98–111)
Creatinine, Ser: 1.13 mg/dL — ABNORMAL HIGH (ref 0.44–1.00)
GFR, Estimated: 51 mL/min — ABNORMAL LOW (ref >60)
Glucose, Bld: 111 mg/dL — ABNORMAL HIGH (ref 70–99)
Potassium: 3.4 mmol/L — ABNORMAL LOW (ref 3.5–5.1)
Sodium: 139 mmol/L (ref 135–145)
Total Bilirubin: 0.9 mg/dL (ref 0.3–1.2)
Total Protein: 7.7 g/dL (ref 6.5–8.1)

## 2022-05-21 LAB — LACTATE DEHYDROGENASE: LDH: 126 U/L (ref 98–192)

## 2022-05-22 LAB — KAPPA/LAMBDA LIGHT CHAINS
Kappa free light chain: 24.6 mg/L — ABNORMAL HIGH (ref 3.3–19.4)
Kappa, lambda light chain ratio: 0.86 (ref 0.26–1.65)
Lambda free light chains: 28.5 mg/L — ABNORMAL HIGH (ref 5.7–26.3)

## 2022-05-25 LAB — PROTEIN ELECTROPHORESIS, SERUM
A/G Ratio: 1.2 (ref 0.7–1.7)
Albumin ELP: 3.8 g/dL (ref 2.9–4.4)
Alpha-1-Globulin: 0.2 g/dL (ref 0.0–0.4)
Alpha-2-Globulin: 0.6 g/dL (ref 0.4–1.0)
Beta Globulin: 1.1 g/dL (ref 0.7–1.3)
Gamma Globulin: 1.3 g/dL (ref 0.4–1.8)
Globulin, Total: 3.2 g/dL (ref 2.2–3.9)
M-Spike, %: 0.8 g/dL — ABNORMAL HIGH
Total Protein ELP: 7 g/dL (ref 6.0–8.5)

## 2022-05-27 NOTE — Progress Notes (Signed)
Victoria Mcconnell, [place] [address]   CLINIC:  Medical Oncology/Hematology  PCP:  Fayrene Helper, MD [address] [place] [address] [phone]   REASON FOR VISIT:  Follow-up for MGUS  PRIOR THERAPY: None  CURRENT THERAPY: Under work-up  INTERVAL HISTORY:  [name] 76 y.o. female returns for routine follow-up of her recently diagnosed MGUS.  She was last seen by Tarri Abernethy PA-C on 11/25/2021.  At today's visit, she reports feeling well.   She denies any recent hospitalizations, new diagnoses, or major changes in her baseline health status since her last visit.  She denies any new bone pain or recent fractures.  She denies any B symptoms such as fever, chills, night sweats, unintentional weight loss.  No abnormal fatigue.  No new neurologic symptoms such as tinnitus, new-onset hearing loss, blurred vision, headache, or dizziness.   Denies any numbness or tingling in hands or feet. No thromboembolic events since her last visit.  No new masses or lymphadenopathy per her report.  She has 100% energy and 100% appetite. She endorses that she is maintaining a stable weight.   REVIEW OF SYSTEMS:  Review of Systems  Constitutional:  Negative for appetite change, chills, diaphoresis, fatigue, fever and unexpected weight change.  HENT:   Negative for lump/mass and nosebleeds.   Eyes:  Negative for eye problems.  Respiratory:  Negative for cough, hemoptysis and shortness of breath.   Cardiovascular:  Negative for chest pain, leg swelling and palpitations.  Gastrointestinal:  Negative for abdominal pain, blood in stool, constipation, diarrhea, nausea and vomiting.  Genitourinary:  Negative for hematuria.   Skin: Negative.   Neurological:  Negative for dizziness, headaches and light-headedness.  Hematological:  Does not bruise/bleed easily.  Psychiatric/Behavioral:  The patient is nervous/anxious.       PAST  MEDICAL/SURGICAL HISTORY:  Past Medical History:  Diagnosis Date   Allergy    seasonal allergies   Anxiety    Arthritis    Depression, major, single episode, moderate ([place]) 07/01/2015   Diabetes mellitus    Glaucoma    Hyperlipidemia    Hypertension    Osteopenia    Past Surgical History:  Procedure Laterality Date   ABDOMINAL HYSTERECTOMY     SPINE SURGERY N/A 11/11/2020   lam facetectomy & foramotomy 1 vrt sgm lumbar     SOCIAL HISTORY:  Social History   Socioeconomic History   Marital status: Widowed    Spouse name: Not on file   Number of children: Not on file   Years of education: Not on file   Highest education level: Not on file  Occupational History   Not on file  Tobacco Use   Smoking status: Former    Types: Cigarettes    Quit date: 09/08/1981    Years since quitting: 40.7   Smokeless tobacco: Never  Substance and Sexual Activity   Alcohol use: No   Drug use: No   Sexual activity: Not Currently  Other Topics Concern   Not on file  Social History Narrative   Not on file   Social Determinants of Health   Financial Resource Strain: Low Risk  (04/29/2020)   Overall Financial Resource Strain (CARDIA)    Difficulty of Paying Living Expenses: Not very hard  Food Insecurity: Not on file  Transportation Needs: Not on file  Physical Activity: Not on file  Stress: Not on file  Social Connections: Not on file  Intimate Partner  Violence: Not on file    FAMILY HISTORY:  Family History  Problem Relation Age of Onset   Arthritis Mother    Cancer Sister    Diabetes Sister    Hyperlipidemia Sister    Hypertension Sister     CURRENT MEDICATIONS:  Outpatient Encounter Medications as of 05/28/2022  Medication Sig Note   acetaminophen (TYLENOL) 500 MG tablet Take 500 mg by mouth 2 (two) times daily as needed.    amLODipine (NORVASC) 5 MG tablet TAKE 1 TABLET BY MOUTH  DAILY    brimonidine (ALPHAGAN) 0.2 % ophthalmic solution Place 1 drop into both eyes in  the morning and at bedtime.    Cholecalciferol (VITAMIN D) 50 MCG (2000 UT) tablet Take 1 tablet (2,000 Units total) by mouth daily. 10/22/2020: Takes 2 daily   clindamycin-benzoyl peroxide (BENZACLIN) gel Apply topically to the entire face every morning    dorzolamide-timolol (COSOPT) 22.3-6.8 MG/ML ophthalmic solution Place 1 drop into both eyes 2 times daily.    escitalopram (LEXAPRO) 20 MG tablet Take 1 tablet (20 mg total) by mouth daily.    gabapentin (NEURONTIN) 100 MG capsule TAKE 1 TO 2 CAPSULES BY  MOUTH AT BEDTIME    Lancets MISC Please dispense based on patient and insurance preference. Use as directed to monitor FSBS 1x daily. Dx: E11.9.    LINZESS 72 MCG capsule TAKE 1 CAPSULE BY MOUTH  DAILY BEFORE BREAKFAST    montelukast (SINGULAIR) 10 MG tablet TAKE 1 TABLET BY MOUTH AT  BEDTIME    ONETOUCH ULTRA test strip USE AS DIRECTED TO MONITOR FASTING BLOOD SUGAR ONCE DAILY    valsartan-hydrochlorothiazide (DIOVAN-HCT) 160-25 MG tablet TAKE 1 TABLET BY MOUTH  DAILY    No facility-administered encounter medications on file as of 05/28/2022.    ALLERGIES:  No Known Allergies   PHYSICAL EXAM:  ECOG PERFORMANCE STATUS: 0 - Asymptomatic  There were no vitals filed for this visit. There were no vitals filed for this visit. Physical Exam Constitutional:      Appearance: Normal appearance.  HENT:     Head: Normocephalic and atraumatic.     Mouth/Throat:     Mouth: Mucous membranes are moist.  Eyes:     Extraocular Movements: Extraocular movements intact.     Pupils: Pupils are equal, round, and reactive to light.  Cardiovascular:     Rate and Rhythm: Normal rate and regular rhythm.     Pulses: Normal pulses.     Heart sounds: Normal heart sounds.  Pulmonary:     Effort: Pulmonary effort is normal.     Breath sounds: Normal breath sounds.  Abdominal:     General: Bowel sounds are normal.     Palpations: Abdomen is soft.     Tenderness: There is no abdominal tenderness.   Musculoskeletal:        General: No swelling.     Right lower leg: No edema.     Left lower leg: No edema.  Lymphadenopathy:     Cervical: No cervical adenopathy.  Skin:    General: Skin is warm and dry.  Neurological:     General: No focal deficit present.     Mental Status: She is alert and oriented to person, place, and time.  Psychiatric:        Mood and Affect: Mood normal.        Behavior: Behavior normal.     LABORATORY DATA:  I have reviewed the labs as listed.  CBC  Component Value Date/Time   WBC 7.3 05/21/2022 1043   RBC 3.81 (L) 05/21/2022 1043   HGB 11.3 (L) 05/21/2022 1043   HCT 34.1 (L) 05/21/2022 1043   PLT 211 05/21/2022 1043   MCV 89.5 05/21/2022 1043   MCH 29.7 05/21/2022 1043   MCHC 33.1 05/21/2022 1043   RDW 13.3 05/21/2022 1043   LYMPHSABS 1.5 05/21/2022 1043   MONOABS 0.5 05/21/2022 1043   EOSABS 0.1 05/21/2022 1043   BASOSABS 0.1 05/21/2022 1043      Latest Ref Rng & Units 05/21/2022   10:43 AM 11/06/2021   10:45 AM 07/09/2021    9:07 AM  CMP  Glucose 70 - 99 mg/dL 111   113   BUN 8 - 23 mg/dL 13   19   Creatinine 0.44 - 1.00 mg/dL 1.13   1.18   Sodium 135 - 145 mmol/L 139   141   Potassium 3.5 - 5.1 mmol/L 3.4   3.9   Chloride 98 - 111 mmol/L 108   105   CO2 22 - 32 mmol/L 26   21   Calcium 8.9 - 10.3 mg/dL 9.9  10.2  10.2   Total Protein 6.5 - 8.1 g/dL 7.7   7.5   Total Bilirubin 0.3 - 1.2 mg/dL 0.9   0.3   Alkaline Phos 38 - 126 U/L 66   90   AST 15 - 41 U/L 15   17   ALT 0 - 44 U/L 10   8     DIAGNOSTIC IMAGING:  I have independently reviewed the relevant imaging and discussed with the patient.  ASSESSMENT & PLAN: 1.  MGUS, IgG lambda - Seen at the request of her nephrologist (Dr. Madelon Lips) for work-up of possible MGUS. - Laboratory work-up sent by her nephrologist (10/14/2021) shows M spike of 1.0 and immunofixation showing IgG lambda monoclonal protein.  Kappa light chains 27.4 with lambda light chains 32.1 and ratio  0.85. - 24-hour urine with UPEP/U IFE was negative.  Normal beta-2 microglobulin.  Normal LDH. - Skeletal survey (11/06/2021) negative for any suspicious osseous lesions - Most recent MGUS/myeloma panel (05/21/2022): M spike 0.8 Light chain stable (elevated kappa 24.6, elevated lambda 28.5, normal ratio 0.86) Creatinine 1.13, calcium 9.9, Hgb 11.3 - She denies any new bone pain, B symptoms, or neurologic changes. - Patient's sister has multiple myeloma in remission after stem cell transplant - Patient meets diagnostic criteria for MGUS only.  No smoldering myeloma or multiple myeloma at this time. - PLAN: MGUS labs are stable, no concern for progression to myeloma at this time.  No indication for treatment or bone marrow biopsy at this time. - We will continue close monitoring with repeat labs in 6 months and annual skeletal survey - We will also check nutritional panel due to mild normocytic anemia (suspected to be anemia related to CKD and chronic disease)  2.  Other history - PMH: CKD stage IIIa, hypertension, type 2 diabetes mellitus - SOCIAL: She is retired.  She is a former smoker, who smokes cigarettes x5 years but quit 40+ years ago.  She denies any alcohol or illicit drugs. - FAMILY: Patient's sister had multiple myeloma, underwent SCT but relapsed and is now on maintenance medications.  Patient's father had prostate cancer.   All questions were answered. The patient knows to call the clinic with any problems, questions or concerns.  Medical decision making: Low  Time spent on visit: I spent 15 minutes counseling the patient face to  face. The total time spent in the appointment was 25 minutes and more than 50% was on counseling.   Harriett Rush, PA-C  05/28/2022 11:21 AM

## 2022-05-28 ENCOUNTER — Inpatient Hospital Stay: Payer: Medicare Other | Admitting: Physician Assistant

## 2022-05-28 VITALS — BP 139/85 | HR 90 | Temp 98.0°F | Resp 16 | Wt 198.6 lb

## 2022-05-28 DIAGNOSIS — N1831 Chronic kidney disease, stage 3a: Secondary | ICD-10-CM | POA: Diagnosis not present

## 2022-05-28 DIAGNOSIS — Z807 Family history of other malignant neoplasms of lymphoid, hematopoietic and related tissues: Secondary | ICD-10-CM | POA: Diagnosis not present

## 2022-05-28 DIAGNOSIS — I129 Hypertensive chronic kidney disease with stage 1 through stage 4 chronic kidney disease, or unspecified chronic kidney disease: Secondary | ICD-10-CM | POA: Diagnosis not present

## 2022-05-28 DIAGNOSIS — D649 Anemia, unspecified: Secondary | ICD-10-CM

## 2022-05-28 DIAGNOSIS — D472 Monoclonal gammopathy: Secondary | ICD-10-CM

## 2022-05-28 DIAGNOSIS — E1122 Type 2 diabetes mellitus with diabetic chronic kidney disease: Secondary | ICD-10-CM | POA: Diagnosis not present

## 2022-05-28 DIAGNOSIS — Z87891 Personal history of nicotine dependence: Secondary | ICD-10-CM | POA: Diagnosis not present

## 2022-05-28 NOTE — Patient Instructions (Signed)
Victoria Mcconnell at [facility] Discharge Instructions  You were seen today by Tarri Abernethy PA-C for your MGUS (abnormal protein).  You do NOT have any signs of myeloma cancer at this time.  We will check your labs and Xrays again in 6 months to make sure your labs are staying stable.    LABS: Return in 6 months for repeat labs and Xrays  FOLLOW-UP APPOINTMENT: Office visit in 6 months, after labs  ** Thank you for trusting me with your healthcare!  I strive to provide all of my patients with quality care at each visit.  If you receive a survey for this visit, I would be so grateful to you for taking the time to provide feedback.  Thank you in advance!  ~ Nik Gorrell                   Dr. Derek Jack   &   Tarri Abernethy, PA-C   - - - - - - - - - - - - - - - - - -    Thank you for choosing [place] at [facility] to provide your oncology and hematology care.  To afford each patient quality time with our provider, please arrive at least 15 minutes before your scheduled appointment time.   If you have a lab appointment with the [facility] please come in thru the Main Entrance and check in at the main information desk.  You need to re-schedule your appointment should you arrive 10 or more minutes late.  We strive to give you quality time with our providers, and arriving late affects you and other patients whose appointments are after yours.  Also, if you no show three or more times for appointments you may be dismissed from the clinic at the providers discretion.     Again, thank you for choosing [facility].  Our hope is that these requests will decrease the amount of time that you wait before being seen by our physicians.       _____________________________________________________________  Should you have questions after your visit to [facility], please contact our office at [phone] and follow the  prompts.  Our office hours are 8:00 a.m. and 4:30 p.m. Monday - Friday.  Please note that voicemails left after 4:00 p.m. may not be returned until the following business day.  We are closed weekends and major holidays.  You do have access to a nurse 24-7, just call the main number to the clinic [phone] and do not press any options, hold on the line and a nurse will answer the phone.    For prescription refill requests, have your pharmacy contact our office and allow 72 hours.    Due to Covid, you will need to wear a mask upon entering the hospital. If you do not have a mask, a mask will be given to you at the Main Entrance upon arrival. For doctor visits, patients may have 1 support person age 71 or older with them. For treatment visits, patients can not have anyone with them due to social distancing guidelines and our immunocompromised population.

## 2022-06-03 ENCOUNTER — Ambulatory Visit (INDEPENDENT_AMBULATORY_CARE_PROVIDER_SITE_OTHER): Payer: Medicare Other

## 2022-06-03 DIAGNOSIS — Z23 Encounter for immunization: Secondary | ICD-10-CM

## 2022-07-16 DIAGNOSIS — R7303 Prediabetes: Secondary | ICD-10-CM | POA: Diagnosis not present

## 2022-07-16 DIAGNOSIS — E559 Vitamin D deficiency, unspecified: Secondary | ICD-10-CM | POA: Diagnosis not present

## 2022-07-16 DIAGNOSIS — I10 Essential (primary) hypertension: Secondary | ICD-10-CM | POA: Diagnosis not present

## 2022-07-16 DIAGNOSIS — E785 Hyperlipidemia, unspecified: Secondary | ICD-10-CM | POA: Diagnosis not present

## 2022-07-17 LAB — LIPID PANEL
Chol/HDL Ratio: 2.7 ratio (ref 0.0–4.4)
Cholesterol, Total: 213 mg/dL — ABNORMAL HIGH (ref 100–199)
HDL: 80 mg/dL (ref >39)
LDL Chol Calc (NIH): 118 mg/dL — ABNORMAL HIGH (ref 0–99)
Triglycerides: 83 mg/dL (ref 0–149)
VLDL Cholesterol Cal: 15 mg/dL (ref 5–40)

## 2022-07-17 LAB — CMP14+EGFR
ALT: 9 IU/L (ref 0–32)
AST: 16 IU/L (ref 0–40)
Albumin/Globulin Ratio: 1.4 (ref 1.2–2.2)
Albumin: 4.2 g/dL (ref 3.8–4.8)
Alkaline Phosphatase: 82 IU/L (ref 44–121)
BUN/Creatinine Ratio: 9 — ABNORMAL LOW (ref 12–28)
BUN: 11 mg/dL (ref 8–27)
Bilirubin Total: 0.5 mg/dL (ref 0.0–1.2)
CO2: 24 mmol/L (ref 20–29)
Calcium: 9.9 mg/dL (ref 8.7–10.3)
Chloride: 106 mmol/L (ref 96–106)
Creatinine, Ser: 1.19 mg/dL — ABNORMAL HIGH (ref 0.57–1.00)
Globulin, Total: 2.9 g/dL (ref 1.5–4.5)
Glucose: 112 mg/dL — ABNORMAL HIGH (ref 70–99)
Potassium: 3.8 mmol/L (ref 3.5–5.2)
Sodium: 143 mmol/L (ref 134–144)
Total Protein: 7.1 g/dL (ref 6.0–8.5)
eGFR: 48 mL/min/{1.73_m2} — ABNORMAL LOW (ref >59)

## 2022-07-17 LAB — HEMOGLOBIN A1C
Est. average glucose Bld gHb Est-mCnc: 128 mg/dL
Hgb A1c MFr Bld: 6.1 % — ABNORMAL HIGH (ref 4.8–5.6)

## 2022-07-17 LAB — VITAMIN D 25 HYDROXY (VIT D DEFICIENCY, FRACTURES): Vit D, 25-Hydroxy: 35.3 ng/mL (ref 30.0–100.0)

## 2022-07-17 LAB — TSH: TSH: 2.27 u[IU]/mL (ref 0.450–4.500)

## 2022-07-23 ENCOUNTER — Ambulatory Visit (INDEPENDENT_AMBULATORY_CARE_PROVIDER_SITE_OTHER): Payer: Medicare Other | Admitting: Family Medicine

## 2022-07-23 ENCOUNTER — Encounter: Payer: Self-pay | Admitting: Family Medicine

## 2022-07-23 VITALS — BP 119/76 | HR 73 | Ht 68.0 in | Wt 203.0 lb

## 2022-07-23 DIAGNOSIS — E785 Hyperlipidemia, unspecified: Secondary | ICD-10-CM | POA: Diagnosis not present

## 2022-07-23 DIAGNOSIS — Z23 Encounter for immunization: Secondary | ICD-10-CM | POA: Diagnosis not present

## 2022-07-23 DIAGNOSIS — R7303 Prediabetes: Secondary | ICD-10-CM | POA: Diagnosis not present

## 2022-07-23 DIAGNOSIS — I10 Essential (primary) hypertension: Secondary | ICD-10-CM

## 2022-07-23 DIAGNOSIS — F411 Generalized anxiety disorder: Secondary | ICD-10-CM

## 2022-07-23 DIAGNOSIS — Z599 Problem related to housing and economic circumstances, unspecified: Secondary | ICD-10-CM | POA: Diagnosis not present

## 2022-07-23 DIAGNOSIS — K5904 Chronic idiopathic constipation: Secondary | ICD-10-CM | POA: Diagnosis not present

## 2022-07-23 DIAGNOSIS — E669 Obesity, unspecified: Secondary | ICD-10-CM

## 2022-07-23 MED ORDER — UNABLE TO FIND: 0 refills | Status: DC

## 2022-07-23 NOTE — Patient Instructions (Addendum)
F/U in 6 months, call if you need me sooner  Fasting lipid, cmp and EGFr and hBa1C 3 to 5 days before next appointment  Increase vegetable and fruit, reduce sweetss and fried and fatty foods, and starchy foods  Keep up great exercise routine  Nurse please print TdaP vaccine  and have pt take to her pharmacy for admin  You are referred to pharmacist with [facility] re linzesse  All the  best for 2024!  Thanks for choosing [facility], we consider it a privelige to serve you.

## 2022-07-26 ENCOUNTER — Encounter: Payer: Self-pay | Admitting: Family Medicine

## 2022-07-26 NOTE — Assessment & Plan Note (Signed)
Controlled on current med, needs help with cost however, referral sent

## 2022-07-26 NOTE — Assessment & Plan Note (Signed)
Deteriorated Patient educated about the importance of limiting  Carbohydrate intake , the need to commit to daily physical activity for a minimum of 30 minutes , and to commit weight loss. The fact that changes in all these areas will reduce or eliminate all together the development of diabetes is stressed.      Latest Ref Rng & Units 07/16/2022    8:41 AM 05/21/2022   10:43 AM 01/20/2022   11:36 AM 07/09/2021    9:07 AM 03/27/2021   10:06 AM  Diabetic Labs  HbA1c 4.8 - 5.6 % 6.1   5.7  6.0    Chol 100 - 199 mg/dL 213    177    HDL >39 mg/dL 80    71    Calc LDL 0 - 99 mg/dL 118    92    Triglycerides 0 - 149 mg/dL 83    74    Creatinine 0.57 - 1.00 mg/dL 1.19  1.13   1.18  1.05       07/23/2022   11:10 AM 05/28/2022   10:44 AM 01/20/2022   11:38 AM 01/20/2022   11:14 AM 11/25/2021   10:05 AM 11/20/2021   11:25 AM 11/06/2021    8:36 AM  BP/Weight  Systolic BP 122 482 500 370 488 891 694  Diastolic BP 76 85 80 69 83 74 88  Wt. (Lbs) 203 198.63  200.08 199.9 199 200.4  BMI 30.87 kg/m2 30.2 kg/m2  30.42 kg/m2 30.39 kg/m2 30.26 kg/m2 29.68 kg/m2      Latest Ref Rng & Units 09/13/2019   10:30 AM 08/14/2019   12:00 AM  Foot/eye exam completion dates  Eye Exam No Retinopathy  No Retinopathy      Foot Form Completion  Done      This result is from an external source.   '

## 2022-07-26 NOTE — Assessment & Plan Note (Signed)
Controlled, no change in medication  

## 2022-07-26 NOTE — Assessment & Plan Note (Signed)
Hyperlipidemia:Low fat diet discussed and encouraged.   Lipid Panel  Lab Results  Component Value Date   CHOL 213 (H) 07/16/2022   HDL 80 07/16/2022   LDLCALC 118 (H) 07/16/2022   TRIG 83 07/16/2022   CHOLHDL 2.7 07/16/2022   Needs to reduce dietary fat intake

## 2022-07-26 NOTE — Progress Notes (Signed)
Victoria Mcconnell     MRN: [id number]      DOB: 1945/10/14   HPI Victoria Mcconnell is here for follow up and re-evaluation of chronic medical conditions, medication management and review of any available recent lab and radiology data.  Preventive health is updated, specifically  Cancer screening and Immunization.   Questions or concerns regarding consultations or procedures which the PT has had in the interim are  addressed. The PT denies any adverse reactions to current medications since the last visit.  Cost of linzess which she needs and as she approaches donut hole this is overwhelming , will refer to [place] for help   ROS Denies recent fever or chills. Denies sinus pressure, nasal congestion, ear pain or sore throat. Denies chest congestion, productive cough or wheezing. Denies chest pains, palpitations and leg swelling Denies abdominal pain, nausea, vomiting,diarrhea or constipation.   Denies dysuria, frequency, hesitancy or incontinence. Denies joint pain, swelling and limitation in mobility. Denies headaches, seizures, numbness, or tingling. Denies depression, anxiety or insomnia. Denies skin break down or rash.   PE  BP 119/76 (BP Location: Left Arm, Patient Position: Sitting, Cuff Size: Large)   Pulse 73   Ht '5\' 8"'$  (1.727 m)   Wt 203 lb (92.1 kg)   SpO2 95%   BMI 30.87 kg/m   Patient alert and oriented and in no cardiopulmonary distress.  HEENT: No facial asymmetry, EOMI,     Neck supple .  Chest: Clear to auscultation bilaterally.  CVS: S1, S2 no murmurs, no S3.Regular rate.  ABD: Soft non tender.   Ext: No edema  MS: Adequate ROM spine, shoulders, hips and knees.  Skin: Intact, no ulcerations or rash noted.  Psych: Good eye contact, normal affect. Memory intact not anxious or depressed appearing.  CNS: CN 2-12 intact, power,  normal throughout.no focal deficits noted.   Assessment & Plan  Essential (primary) hypertension Controlled, no change in  medication DASH diet and commitment to daily physical activity for a minimum of 30 minutes discussed and encouraged, as a part of hypertension management. The importance of attaining a healthy weight is also discussed.     07/23/2022   11:10 AM 05/28/2022   10:44 AM 01/20/2022   11:38 AM 01/20/2022   11:14 AM 11/25/2021   10:05 AM 11/20/2021   11:25 AM 11/06/2021    8:36 AM  BP/Weight  Systolic BP 998 338 250 539 767 341 937  Diastolic BP 76 85 80 69 83 74 88  Wt. (Lbs) 203 198.63  200.08 199.9 199 200.4  BMI 30.87 kg/m2 30.2 kg/m2  30.42 kg/m2 30.39 kg/m2 30.26 kg/m2 29.68 kg/m2       Chronic idiopathic constipation Controlled on current med, needs help with cost however, referral sent  Obesity (BMI 30.0-34.9)  Patient re-educated about  the importance of commitment to a  minimum of 150 minutes of exercise per week as able.  The importance of healthy food choices with portion control discussed, as well as eating regularly and within a 12 hour window most days. The need to choose "clean , green" food 50 to 75% of the time is discussed, as well as to make water the primary drink and set a goal of 64 ounces water daily.       07/23/2022   11:10 AM 05/28/2022   10:44 AM 01/20/2022   11:14 AM  Weight /BMI  Weight 203 lb 198 lb 10.2 oz 200 lb 1.3 oz  Height '5\' 8"'$  (1.727  m)  '5\' 8"'$  (1.727 m)  BMI 30.87 kg/m2 30.2 kg/m2 30.42 kg/m2      GAD (generalized anxiety disorder) Controlled, no change in medication   Hyperlipidemia Hyperlipidemia:Low fat diet discussed and encouraged.   Lipid Panel  Lab Results  Component Value Date   CHOL 213 (H) 07/16/2022   HDL 80 07/16/2022   LDLCALC 118 (H) 07/16/2022   TRIG 83 07/16/2022   CHOLHDL 2.7 07/16/2022   Needs to reduce dietary fat intake    Prediabetes Deteriorated Patient educated about the importance of limiting  Carbohydrate intake , the need to commit to daily physical activity for a minimum of 30 minutes , and to commit  weight loss. The fact that changes in all these areas will reduce or eliminate all together the development of diabetes is stressed.      Latest Ref Rng & Units 07/16/2022    8:41 AM 05/21/2022   10:43 AM 01/20/2022   11:36 AM 07/09/2021    9:07 AM 03/27/2021   10:06 AM  Diabetic Labs  HbA1c 4.8 - 5.6 % 6.1   5.7  6.0    Chol 100 - 199 mg/dL 213    177    HDL >39 mg/dL 80    71    Calc LDL 0 - 99 mg/dL 118    92    Triglycerides 0 - 149 mg/dL 83    74    Creatinine 0.57 - 1.00 mg/dL 1.19  1.13   1.18  1.05       07/23/2022   11:10 AM 05/28/2022   10:44 AM 01/20/2022   11:38 AM 01/20/2022   11:14 AM 11/25/2021   10:05 AM 11/20/2021   11:25 AM 11/06/2021    8:36 AM  BP/Weight  Systolic BP 712 197 588 325 498 264 158  Diastolic BP 76 85 80 69 83 74 88  Wt. (Lbs) 203 198.63  200.08 199.9 199 200.4  BMI 30.87 kg/m2 30.2 kg/m2  30.42 kg/m2 30.39 kg/m2 30.26 kg/m2 29.68 kg/m2      Latest Ref Rng & Units 09/13/2019   10:30 AM 08/14/2019   12:00 AM  Foot/eye exam completion dates  Eye Exam No Retinopathy  No Retinopathy      Foot Form Completion  Done      This result is from an external source.   '

## 2022-07-26 NOTE — Assessment & Plan Note (Signed)
  Patient re-educated about  the importance of commitment to a  minimum of 150 minutes of exercise per week as able.  The importance of healthy food choices with portion control discussed, as well as eating regularly and within a 12 hour window most days. The need to choose "clean , green" food 50 to 75% of the time is discussed, as well as to make water the primary drink and set a goal of 64 ounces water daily.       07/23/2022   11:10 AM 05/28/2022   10:44 AM 01/20/2022   11:14 AM  Weight /BMI  Weight 203 lb 198 lb 10.2 oz 200 lb 1.3 oz  Height '5\' 8"'$  (1.727 m)  '5\' 8"'$  (1.727 m)  BMI 30.87 kg/m2 30.2 kg/m2 30.42 kg/m2

## 2022-07-26 NOTE — Assessment & Plan Note (Signed)
Controlled, no change in medication DASH diet and commitment to daily physical activity for a minimum of 30 minutes discussed and encouraged, as a part of hypertension management. The importance of attaining a healthy weight is also discussed.     07/23/2022   11:10 AM 05/28/2022   10:44 AM 01/20/2022   11:38 AM 01/20/2022   11:14 AM 11/25/2021   10:05 AM 11/20/2021   11:25 AM 11/06/2021    8:36 AM  BP/Weight  Systolic BP 746 002 984 730 856 943 700  Diastolic BP 76 85 80 69 83 74 88  Wt. (Lbs) 203 198.63  200.08 199.9 199 200.4  BMI 30.87 kg/m2 30.2 kg/m2  30.42 kg/m2 30.39 kg/m2 30.26 kg/m2 29.68 kg/m2

## 2022-07-28 ENCOUNTER — Telehealth: Payer: Self-pay

## 2022-07-28 DIAGNOSIS — H401131 Primary open-angle glaucoma, bilateral, mild stage: Secondary | ICD-10-CM | POA: Diagnosis not present

## 2022-07-28 NOTE — Progress Notes (Signed)
[place] [facility]) [name]   07/28/2022  Victoria Mcconnell 1945-12-20 [id number]   Reason for referral: Medication Assistance with Linzess  Referral source: Dr. Moshe Cipro Current insurance: [facility]  Reason for call: Medication Assistance Screening  Outreach:  Unsuccessful telephone call attempt #1 to patient.   HIPAA compliant voicemail left requesting a return call  Plan:  -I will make another outreach attempt to patient in 1-3 business days.   Thanks,  Reed Breech, [place] [phone]

## 2022-08-10 ENCOUNTER — Telehealth: Payer: Self-pay

## 2022-08-10 NOTE — Progress Notes (Signed)
[place] [facility])  [place] Team    08/10/2022  Victoria Mcconnell 1946/03/06 [id number]  Reason for referral: Medication Assistance with Linzess  Referral source: Dr. Moshe Cipro Current insurance: [facility]   Outreach:  Successful telephone call with patient.  HIPAA identifiers verified.   Subjective:  Patient was contacted to screen for medication assistance for Linzess. She requested a call back to gather income documentation. Based on our initial conversation, the patient should qualify for [employer]'s patient assistance program (PAP) for Linzess.    Plan: Will follow-up in 1-2 business days.  Thanks,  Reed Breech, [place] [phone]

## 2022-08-13 ENCOUNTER — Telehealth: Payer: Self-pay | Admitting: Pharmacy Technician

## 2022-08-13 DIAGNOSIS — Z596 Low income: Secondary | ICD-10-CM

## 2022-08-13 NOTE — Progress Notes (Signed)
[place] [facility])                                            [place] Team    08/13/2022  Victoria Mcconnell [birth year] [id number]                                      Medication Assistance Referral  Referral From: [place]   Medication/Company: Rolan Lipa / [employer] Patient application portion:  Mailed Provider application portion: Faxed  to Dr. Tula Nakayama Provider address/fax verified via: Office website    Silvanna Ohmer P. Cartha Rotert, [place]  [phone]

## 2022-08-14 ENCOUNTER — Ambulatory Visit (HOSPITAL_COMMUNITY)
Admission: RE | Admit: 2022-08-14 | Discharge: 2022-08-14 | Disposition: A | Discharge status: Home / Self Care | Payer: Medicare Other | Source: Ambulatory Visit | Attending: Family Medicine | Admitting: Family Medicine

## 2022-08-14 DIAGNOSIS — Z1231 Encounter for screening mammogram for malignant neoplasm of breast: Secondary | ICD-10-CM | POA: Diagnosis not present

## 2022-08-14 DIAGNOSIS — Z1211 Encounter for screening for malignant neoplasm of colon: Secondary | ICD-10-CM | POA: Diagnosis present

## 2022-08-26 ENCOUNTER — Telehealth: Payer: Self-pay | Admitting: Pharmacy Technician

## 2022-08-26 DIAGNOSIS — Z596 Low income: Secondary | ICD-10-CM

## 2022-08-26 NOTE — Progress Notes (Signed)
[place] [facility])                                            [place] Team    08/26/2022  Victoria Mcconnell 1946/03/26 [id number]  Received both patient and provider portion(s) of patient assistance application(s) for Linzess. [place] completed application and required documents into [employer].  Sharee Pimple P. Colvin Blatt, [place]  [phone]

## 2022-08-28 ENCOUNTER — Other Ambulatory Visit: Payer: Self-pay

## 2022-08-28 MED ORDER — ONETOUCH ULTRA VI STRP: ORAL_STRIP | 3 refills | Status: DC

## 2022-09-29 ENCOUNTER — Telehealth: Payer: Self-pay | Admitting: Pharmacy Technician

## 2022-09-29 DIAGNOSIS — Z596 Low income: Secondary | ICD-10-CM

## 2022-09-29 NOTE — Progress Notes (Addendum)
[place] [facility])                                            [place] Team    09/29/2022  [name] Aug 17, 1946 [id number]  Care coordination call placed to [place] in regard to [place] application.  Spoke to [place] who informs patient is APPROVED 09/25/22-09/14/23. Patient will need to call [employer] to refill her medication for delivery to her home.  Kiondre Grenz P. Sitlaly Gudiel, [place]  [phone]

## 2022-10-21 LAB — MICROALBUMIN, URINE: Microalb, Ur: 0.2

## 2022-10-23 DIAGNOSIS — D472 Monoclonal gammopathy: Secondary | ICD-10-CM | POA: Diagnosis not present

## 2022-10-23 DIAGNOSIS — N1831 Chronic kidney disease, stage 3a: Secondary | ICD-10-CM | POA: Diagnosis not present

## 2022-10-23 DIAGNOSIS — I129 Hypertensive chronic kidney disease with stage 1 through stage 4 chronic kidney disease, or unspecified chronic kidney disease: Secondary | ICD-10-CM | POA: Diagnosis not present

## 2022-10-23 DIAGNOSIS — R7303 Prediabetes: Secondary | ICD-10-CM | POA: Diagnosis not present

## 2022-10-23 DIAGNOSIS — E785 Hyperlipidemia, unspecified: Secondary | ICD-10-CM | POA: Diagnosis not present

## 2022-11-02 DIAGNOSIS — L700 Acne vulgaris: Secondary | ICD-10-CM | POA: Diagnosis not present

## 2022-11-02 DIAGNOSIS — L819 Disorder of pigmentation, unspecified: Secondary | ICD-10-CM | POA: Diagnosis not present

## 2022-11-02 DIAGNOSIS — L811 Chloasma: Secondary | ICD-10-CM | POA: Diagnosis not present

## 2022-11-05 ENCOUNTER — Other Ambulatory Visit: Payer: Self-pay | Admitting: Family Medicine

## 2022-11-06 DIAGNOSIS — N1831 Chronic kidney disease, stage 3a: Secondary | ICD-10-CM | POA: Diagnosis not present

## 2022-11-17 ENCOUNTER — Ambulatory Visit (INDEPENDENT_AMBULATORY_CARE_PROVIDER_SITE_OTHER): Payer: Medicare Other

## 2022-11-17 ENCOUNTER — Ambulatory Visit: Payer: Medicare Other | Admitting: Family Medicine

## 2022-11-17 ENCOUNTER — Ambulatory Visit
Admission: RE | Admit: 2022-11-17 | Discharge: 2022-11-17 | Disposition: A | Discharge status: Home / Self Care | Payer: Medicare Other | Source: Ambulatory Visit

## 2022-11-17 ENCOUNTER — Other Ambulatory Visit: Payer: Self-pay

## 2022-11-17 VITALS — BP 127/78 | HR 96 | Temp 98.0°F | Resp 20

## 2022-11-17 DIAGNOSIS — M25531 Pain in right wrist: Secondary | ICD-10-CM

## 2022-11-17 MED ORDER — PREDNISONE 20 MG PO TABS: 40.0000 mg | ORAL_TABLET | Freq: Every day | ORAL | 0 refills | Status: DC

## 2022-11-17 NOTE — ED Triage Notes (Addendum)
Pt reports right wrist pain x2 days.denies any known injury. Pt reports pain feels like "intense pressure" with intermittent tingling.no obvious deformity noted. Moderate swelling to right wrist.

## 2022-11-17 NOTE — Discharge Instructions (Signed)
You may wear a compression wrap on the wrist, elevate at rest, ice off-and-on, over-the-counter pain relievers and rubs and I have sent over a short course of prednisone to help with the pain and inflammation additionally.  This medication will increase her blood sugars temporarily so keep a close eye on that.  Follow-up with your primary care provider if not fully resolving.

## 2022-11-19 ENCOUNTER — Other Ambulatory Visit: Payer: Self-pay | Admitting: Physician Assistant

## 2022-11-19 ENCOUNTER — Ambulatory Visit (HOSPITAL_COMMUNITY)
Admission: RE | Admit: 2022-11-19 | Discharge: 2022-11-19 | Disposition: A | Discharge status: Home / Self Care | Payer: Medicare Other | Source: Ambulatory Visit | Attending: Physician Assistant | Admitting: Physician Assistant

## 2022-11-19 ENCOUNTER — Inpatient Hospital Stay: Payer: Medicare Other | Attending: Hematology

## 2022-11-19 DIAGNOSIS — D472 Monoclonal gammopathy: Secondary | ICD-10-CM | POA: Insufficient documentation

## 2022-11-19 DIAGNOSIS — D649 Anemia, unspecified: Secondary | ICD-10-CM | POA: Diagnosis not present

## 2022-11-19 DIAGNOSIS — I129 Hypertensive chronic kidney disease with stage 1 through stage 4 chronic kidney disease, or unspecified chronic kidney disease: Secondary | ICD-10-CM | POA: Insufficient documentation

## 2022-11-19 DIAGNOSIS — Z90710 Acquired absence of both cervix and uterus: Secondary | ICD-10-CM | POA: Diagnosis not present

## 2022-11-19 DIAGNOSIS — E1122 Type 2 diabetes mellitus with diabetic chronic kidney disease: Secondary | ICD-10-CM | POA: Diagnosis not present

## 2022-11-19 DIAGNOSIS — Z87891 Personal history of nicotine dependence: Secondary | ICD-10-CM | POA: Insufficient documentation

## 2022-11-19 DIAGNOSIS — N1832 Chronic kidney disease, stage 3b: Secondary | ICD-10-CM | POA: Diagnosis not present

## 2022-11-19 LAB — COMPREHENSIVE METABOLIC PANEL
ALT: 14 U/L (ref 0–44)
AST: 18 U/L (ref 15–41)
Albumin: 3.7 g/dL (ref 3.5–5.0)
Alkaline Phosphatase: 63 U/L (ref 38–126)
Anion gap: 8 (ref 5–15)
BUN: 27 mg/dL — ABNORMAL HIGH (ref 8–23)
CO2: 22 mmol/L (ref 22–32)
Calcium: 9.6 mg/dL (ref 8.9–10.3)
Chloride: 105 mmol/L (ref 98–111)
Creatinine, Ser: 1.28 mg/dL — ABNORMAL HIGH (ref 0.44–1.00)
GFR, Estimated: 44 mL/min — ABNORMAL LOW (ref >60)
Glucose, Bld: 95 mg/dL (ref 70–99)
Potassium: 3.1 mmol/L — ABNORMAL LOW (ref 3.5–5.1)
Sodium: 135 mmol/L (ref 135–145)
Total Bilirubin: 0.7 mg/dL (ref 0.3–1.2)
Total Protein: 7.9 g/dL (ref 6.5–8.1)

## 2022-11-19 LAB — CBC WITH DIFFERENTIAL/PLATELET
Abs Immature Granulocytes: 0.03 10*3/uL (ref 0.00–0.07)
Basophils Absolute: 0.1 10*3/uL (ref 0.0–0.1)
Basophils Relative: 1 %
Eosinophils Absolute: 0 10*3/uL (ref 0.0–0.5)
Eosinophils Relative: 0 %
HCT: 32.4 % — ABNORMAL LOW (ref 36.0–46.0)
Hemoglobin: 11.1 g/dL — ABNORMAL LOW (ref 12.0–15.0)
Immature Granulocytes: 0 %
Lymphocytes Relative: 22 %
Lymphs Abs: 2.4 10*3/uL (ref 0.7–4.0)
MCH: 30 pg (ref 26.0–34.0)
MCHC: 34.3 g/dL (ref 30.0–36.0)
MCV: 87.6 fL (ref 80.0–100.0)
Monocytes Absolute: 0.9 10*3/uL (ref 0.1–1.0)
Monocytes Relative: 8 %
Neutro Abs: 7.6 10*3/uL (ref 1.7–7.7)
Neutrophils Relative %: 69 %
Platelets: 232 10*3/uL (ref 150–400)
RBC: 3.7 MIL/uL — ABNORMAL LOW (ref 3.87–5.11)
RDW: 13.7 % (ref 11.5–15.5)
WBC: 11 10*3/uL — ABNORMAL HIGH (ref 4.0–10.5)
nRBC: 0 % (ref 0.0–0.2)

## 2022-11-19 LAB — IRON AND TIBC
Iron: 73 ug/dL (ref 28–170)
Saturation Ratios: 24 % (ref 10.4–31.8)
TIBC: 303 ug/dL (ref 250–450)
UIBC: 230 ug/dL

## 2022-11-19 LAB — LACTATE DEHYDROGENASE: LDH: 122 U/L (ref 98–192)

## 2022-11-19 LAB — FERRITIN: Ferritin: 130 ng/mL (ref 11–307)

## 2022-11-19 LAB — FOLATE: Folate: 9.2 ng/mL (ref >5.9)

## 2022-11-19 LAB — VITAMIN B12: Vitamin B-12: 223 pg/mL (ref 180–914)

## 2022-11-20 LAB — KAPPA/LAMBDA LIGHT CHAINS
Kappa free light chain: 23.9 mg/L — ABNORMAL HIGH (ref 3.3–19.4)
Kappa, lambda light chain ratio: 0.82 (ref 0.26–1.65)
Lambda free light chains: 29.3 mg/L — ABNORMAL HIGH (ref 5.7–26.3)

## 2022-11-20 NOTE — ED Provider Notes (Signed)
[facility]    CSN: [id number] Arrival date & time: 11/17/22  0816      History   Chief Complaint Chief Complaint  Patient presents with   Wrist Pain    Pain on the right arm and wrist - Entered by patient    HPI Victoria Mcconnell is a 77 y.o. female.   Patient presenting today with 2-day history of right wrist pain, swelling.  Denies any known injury to the area but has done some heavy lifting recently that she is not used to.  Denies redness, numbness, tingling, complete loss of range of motion.  So far trying topical pain relievers, Biofreeze, ice with minimal relief.    Past Medical History:  Diagnosis Date   Allergy    seasonal allergies   Anxiety    Arthritis    Depression, major, single episode, moderate ([place]) 07/01/2015   Diabetes mellitus    Glaucoma    Hyperlipidemia    Hypertension    Osteopenia     Patient Active Problem List   Diagnosis Date Noted   Need for Tdap vaccination 07/23/2022   Depression 11/23/2021   IgG Lamda MGUS 11/06/2021   Skin lesions 07/09/2021   Obesity (BMI 30.0-34.9) 03/29/2021   Right hip pain 03/27/2021   Abnormal feces 02/20/2021   Chronic idiopathic constipation 02/20/2021   Family history of malignant neoplasm of digestive organ 02/20/2021   Personal history of colonic polyps 02/20/2021   Degenerative spondylolisthesis 05/29/2020   Spinal stenosis of lumbar region 05/29/2020   DDD (degenerative disc disease), lumbar 03/22/2020   Osteopenia 08/17/2017   Constipation 07/07/2017   GAD (generalized anxiety disorder) 07/07/2017   Ankle sprain 02/27/2015   OA (osteoarthritis) 02/27/2015   Hip bursitis 02/27/2015   Essential (primary) hypertension 01/16/2015   Prediabetes 01/16/2015   Hyperlipidemia 01/16/2015   Left shoulder pain 01/16/2015   Glaucoma 01/16/2015    Past Surgical History:  Procedure Laterality Date   ABDOMINAL HYSTERECTOMY     SPINE SURGERY N/A 11/11/2020   lam facetectomy & foramotomy 1 vrt  sgm lumbar    OB History   No obstetric history on file.      Home Medications    Prior to Admission medications   Medication Sig Start Date End Date Taking? Authorizing Provider  empagliflozin (JARDIANCE) 10 MG TABS tablet Take 10 mg by mouth daily.   Yes [provider]  predniSONE (DELTASONE) 20 MG tablet Take 2 tablets (40 mg total) by mouth daily with breakfast. 11/17/22  Yes Volney American, PA-C  acetaminophen (TYLENOL) 500 MG tablet Take 500 mg by mouth 2 (two) times daily as needed.    [provider]  amLODipine (NORVASC) 5 MG tablet TAKE 1 TABLET BY MOUTH  DAILY 02/10/22   Fayrene Helper, MD  brimonidine (ALPHAGAN) 0.2 % ophthalmic solution Place 1 drop into both eyes in the morning and at bedtime.    [provider]  Cholecalciferol (VITAMIN D) 50 MCG (2000 UT) tablet Take 1 tablet (2,000 Units total) by mouth daily. 10/16/20   Alycia Rossetti, MD  clindamycin-benzoyl peroxide [facility]) gel Apply topically to the entire face every morning Patient not taking: Reported on 07/23/2022 10/29/21   [provider]  dorzolamide-timolol (COSOPT) 22.3-6.8 MG/ML ophthalmic solution Place 1 drop into both eyes 2 times daily. 10/21/20   [provider]  escitalopram (LEXAPRO) 20 MG tablet Take 1 tablet (20 mg total) by mouth daily. 03/02/22   Fayrene Helper, MD  gabapentin (NEURONTIN) 100 MG capsule TAKE 1 TO 2 CAPSULES BY  MOUTH AT BEDTIME 02/10/22   Fayrene Helper, MD  glucose blood [facility] ULTRA) test strip Use as instructed 08/28/22   Fayrene Helper, MD  Lancets MISC Please dispense based on patient and insurance preference. Use as directed to monitor FSBS 1x daily. Dx: E11.9. 02/21/20   Alycia Rossetti, MD  LINZESS 72 MCG capsule TAKE 1 CAPSULE BY MOUTH  DAILY BEFORE BREAKFAST 11/17/21   Fayrene Helper, MD  montelukast (SINGULAIR) 10 MG tablet TAKE 1 TABLET BY MOUTH AT  BEDTIME 11/17/21   Fayrene Helper, MD   UNABLE TO FIND Tdap Vaccine DX : Z23 07/23/22   Fayrene Helper, MD  valsartan-hydrochlorothiazide (DIOVAN-HCT) 160-25 MG tablet TAKE 1 TABLET BY MOUTH DAILY 11/05/22   Fayrene Helper, MD    Family History Family History  Problem Relation Age of Onset   Arthritis Mother    Cancer Sister    Diabetes Sister    Hyperlipidemia Sister    Hypertension Sister     Social History Social History   Tobacco Use   Smoking status: Former    Types: Cigarettes    Quit date: 09/08/1981    Years since quitting: 41.2   Smokeless tobacco: Never  Substance Use Topics   Alcohol use: No   Drug use: No     Allergies   Patient has no known allergies.   Review of Systems Review of Systems PER HPI  Physical Exam Triage Vital Signs ED Triage Vitals  Enc Vitals Group     BP 11/17/22 0826 127/78     Pulse Rate 11/17/22 0826 96     Resp 11/17/22 0826 20     Temp 11/17/22 0826 98 F (36.7 C)     Temp Source 11/17/22 0826 Oral     SpO2 11/17/22 0826 93 %     Weight --      Height --      Head Circumference --      Peak Flow --      Pain Score 11/17/22 0824 10     Pain Loc --      Pain Edu? --      Excl. in [place]? --    No data found.  Updated Vital Signs BP 127/78 (BP Location: Right Arm)   Pulse 96   Temp 98 F (36.7 C) (Oral)   Resp 20   SpO2 93%   Visual Acuity Right Eye Distance:   Left Eye Distance:   Bilateral Distance:    Right Eye Near:   Left Eye Near:    Bilateral Near:     Physical Exam Vitals and nursing note reviewed.  Constitutional:      Appearance: Normal appearance. She is not ill-appearing.  HENT:     Head: Atraumatic.  Eyes:     Extraocular Movements: Extraocular movements intact.     Conjunctiva/sclera: Conjunctivae normal.  Cardiovascular:     Rate and Rhythm: Normal rate and regular rhythm.     Heart sounds: Normal heart sounds.  Pulmonary:     Effort: Pulmonary effort is normal.     Breath sounds: Normal breath sounds.   Musculoskeletal:        General: Swelling and tenderness present. No deformity or signs of injury. Normal range of motion.     Cervical back: Normal range of motion and neck supple.     Comments: Diffuse edema, tenderness to palpation across right  wrist particularly dorsally.  Tender to palpation diffusely, range of motion overall intact  Skin:    General: Skin is warm and dry.     Findings: No bruising or erythema.  Neurological:     Mental Status: She is alert and oriented to person, place, and time.     Comments: Right upper extremity neurovascularly intact  Psychiatric:        Mood and Affect: Mood normal.        Thought Content: Thought content normal.        Judgment: Judgment normal.      UC Treatments / Results  Labs (all labs ordered are listed, but only abnormal results are displayed) Labs Reviewed - No data to display  EKG   Radiology No results found.  Procedures Procedures (including critical care time)  Medications Ordered in UC Medications - No data to display  Initial Impression / Assessment and Plan / UC Course  I have reviewed the triage vital signs and the nursing notes.  Pertinent labs & imaging results that were available during my care of the patient were reviewed by me and considered in my medical decision making (see chart for details).     X-ray negative for acute bony abnormality, does show some arthritis and suspect inflammatory etiology today such as tendinitis, arthritis flare.  Treat with prednisone, topical rubs, ice, elevation, compression.  Return for worsening symptoms.  Final Clinical Impressions(s) / UC Diagnoses   Final diagnoses:  Right wrist pain     Discharge Instructions      You may wear a compression wrap on the wrist, elevate at rest, ice off-and-on, over-the-counter pain relievers and rubs and I have sent over a short course of prednisone to help with the pain and inflammation additionally.  This medication will  increase her blood sugars temporarily so keep a close eye on that.  Follow-up with your primary care provider if not fully resolving.    ED Prescriptions     Medication Sig Dispense Auth. Provider   predniSONE (DELTASONE) 20 MG tablet Take 2 tablets (40 mg total) by mouth daily with breakfast. 6 tablet Volney American, [place]      PDMP not reviewed this encounter.   Volney American, [place] 11/20/22 1730

## 2022-11-21 LAB — COPPER, SERUM: Copper: 135 ug/dL (ref 80–158)

## 2022-11-23 LAB — PROTEIN ELECTROPHORESIS, SERUM
A/G Ratio: 1.1 (ref 0.7–1.7)
Albumin ELP: 3.7 g/dL (ref 2.9–4.4)
Alpha-1-Globulin: 0.3 g/dL (ref 0.0–0.4)
Alpha-2-Globulin: 0.8 g/dL (ref 0.4–1.0)
Beta Globulin: 1 g/dL (ref 0.7–1.3)
Gamma Globulin: 1.4 g/dL (ref 0.4–1.8)
Globulin, Total: 3.5 g/dL (ref 2.2–3.9)
M-Spike, %: 0.8 g/dL — ABNORMAL HIGH
Total Protein ELP: 7.2 g/dL (ref 6.0–8.5)

## 2022-11-24 LAB — METHYLMALONIC ACID, SERUM: Methylmalonic Acid, Quantitative: 236 nmol/L (ref 0–378)

## 2022-11-25 NOTE — Progress Notes (Unsigned)
[place], [place] [address]   CLINIC:  Medical Oncology/Hematology  PCP:  Fayrene Helper, MD [address] [place] [address] [phone]   REASON FOR VISIT:  Follow-up for IgG lambda MGUS   PRIOR THERAPY: None  CURRENT THERAPY: Surveillance  INTERVAL HISTORY:   Victoria Mcconnell 77 y.o. female returns for routine follow-up of MGUS.  She was last seen by Tarri Abernethy PA-C on 05/28/2022.   At today's visit, she reports feeling well.   She denies any recent hospitalizations, new diagnoses, or major changes in her baseline health status since her last visit.   She had some new onset right wrist pain since her last visit, and was diagnosed with osteoarthritis; her pain improved after a course of oral prednisone.  She denies any other new bone pain or fractures.  She denies any B symptoms such as fever, chills, night sweats, unintentional weight loss.  No abnormal fatigue. No new neurologic symptoms such as tinnitus, new-onset hearing loss, blurred vision, headache, or dizziness. Denies any numbness or tingling in hands or feet.  No new masses or lymphadenopathy per her report.  She has 100% energy and 100% appetite. She endorses that she is maintaining a stable weight.   ASSESSMENT & PLAN:  1.  MGUS, IgG lambda - Seen at the request of her nephrologist (Dr. Madelon Lips) for work-up of possible MGUS. - Laboratory work-up sent by her nephrologist (10/14/2021) shows M spike of 1.0 and immunofixation showing IgG lambda monoclonal protein.  Kappa light chains 27.4 with lambda light chains 32.1 and ratio 0.85. - 24-hour urine with UPEP/U IFE was negative.  Normal beta-2 microglobulin.  Normal LDH. - Most recent skeletal survey (11/19/2022) was negative for any suspicious osseous lesions - Most recent MGUS/myeloma panel (11/19/2022): M spike 0.8 (stable) Light chain stable (elevated kappa 23.9, elevated lambda 29.3, normal ratio  0.82) Creatinine 1.28, calcium 9.6, Hgb 11.1.  Normal LDH. - She denies any new bone pain, B symptoms, or neurologic changes. - Patient's sister has multiple myeloma in remission after stem cell transplant - Patient meets diagnostic criteria for MGUS only.  No smoldering myeloma or multiple myeloma at this time. - PLAN: MGUS labs are stable, no concern for progression to myeloma at this time.  No indication for treatment or bone marrow biopsy at this time. - We will continue close monitoring with repeat labs in 6 months and annual skeletal survey  2.  Normocytic anemia - Mild normocytic anemia since at least 2016, with Hgb baseline 11.0-12.0 - She has CKD stage IIIa/b - most recent creatinine 1.28/GFR 44 (11/19/2022) - Hematology workup (11/19/2022): Ferritin 130, iron saturation 24%.  Normal folate, LDH, copper - Borderline B12 deficiency with low-normal vitamin B12 223, normal MMA 236 - Most recent CBC/differential (11/19/2022): Hgb 11.1/MCV 87.6 - PLAN: Suspect anemia secondary to CKD.  Recommend starting vitamin B12 500 mcg daily.  Otherwise, continue surveillance at this time.  Repeat B12/MMA with labs in 6 months.  3.  Other history - PMH: CKD stage IIIa, hypertension, type 2 diabetes mellitus - SOCIAL: She is retired.  She is a former smoker, who smokes cigarettes x5 years but quit 40+ years ago.  She denies any alcohol or illicit drugs. - FAMILY: Patient's sister had multiple myeloma, underwent SCT but relapsed and is now on maintenance medications.  Patient's father had prostate cancer.  PLAN SUMMARY: >> Labs in 6 months = SPEP, light chains, LDH, CMP, CBC/differential,  B12, MMA >> OFFICE visit in 6 months (1 week after labs)     REVIEW OF SYSTEMS:   Review of Systems  Constitutional:  Negative for appetite change, chills, diaphoresis, fatigue, fever and unexpected weight change.  HENT:   Negative for lump/mass and nosebleeds.   Eyes:  Negative for eye problems.  Respiratory:   Negative for cough, hemoptysis and shortness of breath.   Cardiovascular:  Negative for chest pain, leg swelling and palpitations.  Gastrointestinal:  Negative for abdominal pain, blood in stool, constipation, diarrhea, nausea and vomiting.  Genitourinary:  Negative for hematuria.   Skin: Negative.   Neurological:  Negative for dizziness, headaches and light-headedness.  Hematological:  Does not bruise/bleed easily.  Psychiatric/Behavioral:  The patient is nervous/anxious.      PHYSICAL EXAM:  ECOG PERFORMANCE STATUS: 0 - Asymptomatic  There were no vitals filed for this visit. There were no vitals filed for this visit. Physical Exam Constitutional:      Appearance: Normal appearance. She is obese.  Cardiovascular:     Heart sounds: Normal heart sounds.  Pulmonary:     Breath sounds: Normal breath sounds.  Neurological:     General: No focal deficit present.     Mental Status: Mental status is at baseline.  Psychiatric:        Behavior: Behavior normal. Behavior is cooperative.     PAST MEDICAL/SURGICAL HISTORY:  Past Medical History:  Diagnosis Date   Allergy    seasonal allergies   Anxiety    Arthritis    Depression, major, single episode, moderate ([place]) 07/01/2015   Diabetes mellitus    Glaucoma    Hyperlipidemia    Hypertension    Osteopenia    Past Surgical History:  Procedure Laterality Date   ABDOMINAL HYSTERECTOMY     SPINE SURGERY N/A 11/11/2020   lam facetectomy & foramotomy 1 vrt sgm lumbar    SOCIAL HISTORY:  Social History   Socioeconomic History   Marital status: Widowed    Spouse name: Not on file   Number of children: Not on file   Years of education: Not on file   Highest education level: Not on file  Occupational History   Not on file  Tobacco Use   Smoking status: Former    Types: Cigarettes    Quit date: 09/08/1981    Years since quitting: 41.2   Smokeless tobacco: Never  Substance and Sexual Activity   Alcohol use: No   Drug  use: No   Sexual activity: Not Currently  Other Topics Concern   Not on file  Social History Narrative   Not on file   Social Determinants of Health   Financial Resource Strain: Low Risk  (04/29/2020)   Overall Financial Resource Strain (CARDIA)    Difficulty of Paying Living Expenses: Not very hard  Food Insecurity: Not on file  Transportation Needs: Not on file  Physical Activity: Not on file  Stress: Not on file  Social Connections: Not on file  Intimate Partner Violence: Not on file    FAMILY HISTORY:  Family History  Problem Relation Age of Onset   Arthritis Mother    Cancer Sister    Diabetes Sister    Hyperlipidemia Sister    Hypertension Sister     CURRENT MEDICATIONS:  Outpatient Encounter Medications as of 11/26/2022  Medication Sig Note   acetaminophen (TYLENOL) 500 MG tablet Take 500 mg by mouth 2 (two) times daily as needed.    amLODipine (NORVASC) 5  MG tablet TAKE 1 TABLET BY MOUTH  DAILY    brimonidine (ALPHAGAN) 0.2 % ophthalmic solution Place 1 drop into both eyes in the morning and at bedtime.    Cholecalciferol (VITAMIN D) 50 MCG (2000 UT) tablet Take 1 tablet (2,000 Units total) by mouth daily. 10/22/2020: Takes 2 daily   clindamycin-benzoyl peroxide (BENZACLIN) gel Apply topically to the entire face every morning (Patient not taking: Reported on 07/23/2022)    dorzolamide-timolol (COSOPT) 22.3-6.8 MG/ML ophthalmic solution Place 1 drop into both eyes 2 times daily.    empagliflozin (JARDIANCE) 10 MG TABS tablet Take 10 mg by mouth daily.    escitalopram (LEXAPRO) 20 MG tablet Take 1 tablet (20 mg total) by mouth daily.    gabapentin (NEURONTIN) 100 MG capsule TAKE 1 TO 2 CAPSULES BY  MOUTH AT BEDTIME    glucose blood (ONETOUCH ULTRA) test strip Use as instructed    Lancets MISC Please dispense based on patient and insurance preference. Use as directed to monitor FSBS 1x daily. Dx: E11.9.    LINZESS 72 MCG capsule TAKE 1 CAPSULE BY MOUTH  DAILY BEFORE  BREAKFAST    montelukast (SINGULAIR) 10 MG tablet TAKE 1 TABLET BY MOUTH AT  BEDTIME    predniSONE (DELTASONE) 20 MG tablet Take 2 tablets (40 mg total) by mouth daily with breakfast.    UNABLE TO FIND Tdap Vaccine DX : Z23    valsartan-hydrochlorothiazide (DIOVAN-HCT) 160-25 MG tablet TAKE 1 TABLET BY MOUTH DAILY    No facility-administered encounter medications on file as of 11/26/2022.    ALLERGIES:  No Known Allergies  LABORATORY DATA:  I have reviewed the labs as listed.  CBC    Component Value Date/Time   WBC 11.0 (H) 11/19/2022 1045   RBC 3.70 (L) 11/19/2022 1045   HGB 11.1 (L) 11/19/2022 1045   HCT 32.4 (L) 11/19/2022 1045   PLT 232 11/19/2022 1045   MCV 87.6 11/19/2022 1045   MCH 30.0 11/19/2022 1045   MCHC 34.3 11/19/2022 1045   RDW 13.7 11/19/2022 1045   LYMPHSABS 2.4 11/19/2022 1045   MONOABS 0.9 11/19/2022 1045   EOSABS 0.0 11/19/2022 1045   BASOSABS 0.1 11/19/2022 1045      Latest Ref Rng & Units 11/19/2022   10:45 AM 07/16/2022    8:41 AM 05/21/2022   10:43 AM  CMP  Glucose 70 - 99 mg/dL 95  112  111   BUN 8 - 23 mg/dL '27  11  13   '$ Creatinine 0.44 - 1.00 mg/dL 1.28  1.19  1.13   Sodium 135 - 145 mmol/L 135  143  139   Potassium 3.5 - 5.1 mmol/L 3.1  3.8  3.4   Chloride 98 - 111 mmol/L 105  106  108   CO2 22 - 32 mmol/L '22  24  26   '$ Calcium 8.9 - 10.3 mg/dL 9.6  9.9  9.9   Total Protein 6.5 - 8.1 g/dL 7.9  7.1  7.7   Total Bilirubin 0.3 - 1.2 mg/dL 0.7  0.5  0.9   Alkaline Phos 38 - 126 U/L 63  82  66   AST 15 - 41 U/L '18  16  15   '$ ALT 0 - 44 U/L '14  9  10     '$ DIAGNOSTIC IMAGING:  I have independently reviewed the relevant imaging and discussed with the patient.   WRAP UP:  All questions were answered. The patient knows to call the clinic with any problems, questions  or concerns.  Medical decision making: Moderate  Time spent on visit: I spent 20 minutes counseling the patient face to face. The total time spent in the appointment was 30 minutes and  more than 50% was on counseling.  Harriett Rush, PA-C  11/26/22 10:07 AM

## 2022-11-26 ENCOUNTER — Inpatient Hospital Stay: Payer: Medicare Other | Admitting: Physician Assistant

## 2022-11-26 ENCOUNTER — Ambulatory Visit: Payer: Medicare Other | Admitting: Physician Assistant

## 2022-11-26 ENCOUNTER — Other Ambulatory Visit: Payer: Self-pay

## 2022-11-26 VITALS — BP 124/73 | HR 71 | Temp 98.1°F | Resp 16 | Wt 203.7 lb

## 2022-11-26 DIAGNOSIS — D649 Anemia, unspecified: Secondary | ICD-10-CM

## 2022-11-26 DIAGNOSIS — E538 Deficiency of other specified B group vitamins: Secondary | ICD-10-CM | POA: Diagnosis not present

## 2022-11-26 DIAGNOSIS — E1122 Type 2 diabetes mellitus with diabetic chronic kidney disease: Secondary | ICD-10-CM | POA: Diagnosis not present

## 2022-11-26 DIAGNOSIS — N183 Chronic kidney disease, stage 3 unspecified: Secondary | ICD-10-CM | POA: Diagnosis not present

## 2022-11-26 DIAGNOSIS — D472 Monoclonal gammopathy: Secondary | ICD-10-CM

## 2022-11-26 DIAGNOSIS — D631 Anemia in chronic kidney disease: Secondary | ICD-10-CM

## 2022-11-26 DIAGNOSIS — N1832 Chronic kidney disease, stage 3b: Secondary | ICD-10-CM | POA: Diagnosis not present

## 2022-11-26 DIAGNOSIS — I129 Hypertensive chronic kidney disease with stage 1 through stage 4 chronic kidney disease, or unspecified chronic kidney disease: Secondary | ICD-10-CM | POA: Diagnosis not present

## 2022-11-26 DIAGNOSIS — Z87891 Personal history of nicotine dependence: Secondary | ICD-10-CM | POA: Diagnosis not present

## 2022-11-26 MED ORDER — CYANOCOBALAMIN 500 MCG PO TABS: 500.0000 ug | ORAL_TABLET | Freq: Every day | ORAL | 2 refills | Status: DC

## 2022-11-26 NOTE — Patient Instructions (Signed)
[place] at [facility] Discharge Instructions  You were seen today by Tarri Abernethy PA-C for your MGUS (abnormal protein).  You do NOT have any signs of myeloma cancer at this time.  We will check your labs again in 6 months to make sure your labs are staying stable.    You did have some mild anemia which is related to your chronic kidney disease.  You do not need any treatment for that at this time, but we will continue to monitor it.  You do also have some very mild vitamin B12 deficiency.  I would like you to start taking vitamin B12 (cyanocobalamin) 500 mcg daily.  This is available over-the-counter, but I have also sent a prescription to your local pharmacy.  LABS: Return in 6 months for repeat labs  FOLLOW-UP APPOINTMENT: Office visit in 6 months, after labs  ** Thank you for trusting me with your healthcare!  I strive to provide all of my patients with quality care at each visit.  If you receive a survey for this visit, I would be so grateful to you for taking the time to provide feedback.  Thank you in advance!  ~ Janalynn Eder                   Dr. Derek Jack   &   Tarri Abernethy, PA-C   - - - - - - - - - - - - - - - - - -    Thank you for choosing [place] at [facility] to provide your oncology and hematology care.  To afford each patient quality time with our provider, please arrive at least 15 minutes before your scheduled appointment time.   If you have a lab appointment with the [facility] please come in thru the Main Entrance and check in at the main information desk.  You need to re-schedule your appointment should you arrive 10 or more minutes late.  We strive to give you quality time with our providers, and arriving late affects you and other patients whose appointments are after yours.  Also, if you no show three or more times for appointments you may be dismissed from the clinic at the providers discretion.      Again, thank you for choosing [facility]  Our hope is that these requests will decrease the amount of time that you wait before being seen by our physicians.       _____________________________________________________________  Should you have questions after your visit to [facility], please contact our office at [phone] and follow the prompts.  Our office hours are 8:00 a.m. and 4:30 p.m. Monday - Friday.  Please note that voicemails left after 4:00 p.m. may not be returned until the following business day.  We are closed weekends and major holidays.  You do have access to a nurse 24-7, just call the main number to the clinic [phone] and do not press any options, hold on the line and a nurse will answer the phone.    For prescription refill requests, have your pharmacy contact our office and allow 72 hours.    Due to Covid, you will need to wear a mask upon entering the hospital. If you do not have a mask, a mask will be given to you at the Main Entrance upon arrival. For doctor visits, patients may have 1 support person age 70 or older with them. For treatment  visits, patients can not have anyone with them due to social distancing guidelines and our immunocompromised population.

## 2022-11-30 DIAGNOSIS — H5213 Myopia, bilateral: Secondary | ICD-10-CM | POA: Diagnosis not present

## 2022-11-30 DIAGNOSIS — H524 Presbyopia: Secondary | ICD-10-CM | POA: Diagnosis not present

## 2022-11-30 DIAGNOSIS — H25813 Combined forms of age-related cataract, bilateral: Secondary | ICD-10-CM | POA: Diagnosis not present

## 2022-11-30 DIAGNOSIS — H52203 Unspecified astigmatism, bilateral: Secondary | ICD-10-CM | POA: Diagnosis not present

## 2022-11-30 DIAGNOSIS — E119 Type 2 diabetes mellitus without complications: Secondary | ICD-10-CM | POA: Diagnosis not present

## 2022-11-30 DIAGNOSIS — H401131 Primary open-angle glaucoma, bilateral, mild stage: Secondary | ICD-10-CM | POA: Diagnosis not present

## 2022-12-09 ENCOUNTER — Encounter: Payer: Self-pay | Admitting: Family Medicine

## 2022-12-09 ENCOUNTER — Ambulatory Visit (INDEPENDENT_AMBULATORY_CARE_PROVIDER_SITE_OTHER): Payer: Medicare Other | Admitting: Family Medicine

## 2022-12-09 VITALS — BP 137/75 | HR 75 | Ht 68.0 in | Wt 206.1 lb

## 2022-12-09 DIAGNOSIS — M25531 Pain in right wrist: Secondary | ICD-10-CM | POA: Diagnosis not present

## 2022-12-09 DIAGNOSIS — L84 Corns and callosities: Secondary | ICD-10-CM | POA: Insufficient documentation

## 2022-12-09 DIAGNOSIS — M25539 Pain in unspecified wrist: Secondary | ICD-10-CM

## 2022-12-09 MED ORDER — PREDNISONE 20 MG PO TABS: 20.0000 mg | ORAL_TABLET | Freq: Two times a day (BID) | ORAL | 0 refills | Status: AC

## 2022-12-09 NOTE — Assessment & Plan Note (Signed)
Education as follow: Home treatment for foot callus includes: -Soak the corn or callus in warm water. Do this for about five to 10 minutes or until the skin softens. -Apply moisturizing lotion or cream to the area daily. Look for a moisturizing lotion or cream with salicylic acid, ammonium lactate, or urea. These ingredients will help gradually soften hard corns and calluses. -Use padding. To protect calluses from further irritation during activity, cut a piece of moleskin - available at your local drugstore - into two half-moon shapes and place around the callus.

## 2022-12-09 NOTE — Patient Instructions (Signed)
I appreciate the opportunity to provide care to you today!    Follow up:  Dr. Moshe Cipro as needed  Labs: none today  Home treatment for foot callus includes: -Soak the corn or callus in warm water. Do this for about five to 10 minutes or until the skin softens. -Apply moisturizing lotion or cream to the area daily. Look for a moisturizing lotion or cream with salicylic acid, ammonium lactate, or urea. These ingredients will help gradually soften hard corns and calluses. -Use padding. To protect calluses from further irritation during activity, cut a piece of moleskin - available at your local drugstore - into two half-moon shapes and place around the callus.     Please pick your medication at the pharmacy and start therapy  Right Wrist Pain -Take prednisone 20 mg BID -Alternate with heat and cold therapy  -When  your swelling subside, I recommend wearing  a wrist brace   Apply heat to the affected area such as a moist heat pack or a heating pad. Place a towel between your skin and the heat source. Leave the heat on for 20-30 minutes. Remove the heat if your skin turns bright red. This is especially important if you are unable to feel pain, heat, or cold. You may have a greater risk of getting burned. Apply  ice on the painful area. To do this: If you have a removable splint, remove it as told by your health care provider. Put ice in a plastic bag. Place a towel between your skin and the bag or between your splint and the bag. Leave the ice on for 20 minutes, 2-3 times a day.     Referrals today-  orthopedics surgery    Please continue to a heart-healthy diet and increase your physical activities. Try to exercise for 35mins at least five days a week.      It was a pleasure to see you and I look forward to continuing to work together on your health and well-being. Please do not hesitate to call the office if you need care or have questions about your care.   Have a wonderful day  and week. With Gratitude, Alvira Monday MSN, FNP-BC

## 2022-12-09 NOTE — Progress Notes (Signed)
Acute Office Visit  Subjective:    Patient ID: Victoria Mcconnell, female    DOB: June 29, 1946, 77 y.o.   MRN: [id number]  Chief Complaint  Patient presents with   Wrist Pain    Started 11/17/2022 and went UC dull aching pain.    Toe Injury    Sore on right foot in between big toe and next toe. Noticed it 11/17/22    HPI Patient is in today with complaints of right wrist pain.  Right wrist pain: She was seen in the ED on 11/17/2022 with similar complaints of right wrist pain and swelling and was treated with prednisone 40 mg daily for 3 days.  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  She is in today with similar complaints.  No recent injury or trauma  was reported.  She reports wrist pain with lifting, extension, and ulnar deviation of the right rates.  Pain is rated today 9 out of 10.   Foot callus: No recent injury or trauma to the right foot.  The patient does has interdigital callus at the medial aspect of the second digital.  Onset of symptoms 11/17/2022.  No pain with ambulation.she does note discomfort at the affected site with certain type of shoes.  She denies walking barefooted.  Past Medical History:  Diagnosis Date   Allergy    seasonal allergies   Anxiety    Arthritis    Depression, major, single episode, moderate ([place]) 07/01/2015   Diabetes mellitus    Glaucoma    Hyperlipidemia    Hypertension    Osteopenia     Past Surgical History:  Procedure Laterality Date   ABDOMINAL HYSTERECTOMY     SPINE SURGERY N/A 11/11/2020   lam facetectomy & foramotomy 1 vrt sgm lumbar    Family History  Problem Relation Age of Onset   Arthritis Mother    Cancer Sister    Diabetes Sister    Hyperlipidemia Sister    Hypertension Sister     Social History   Socioeconomic History   Marital status: Widowed    Spouse name: Not on file   Number of children: Not on file   Years of education: Not on file   Highest education level: Bachelor's degree  (e.g., BA, AB, BS)  Occupational History   Not on file  Tobacco Use   Smoking status: Former    Types: Cigarettes    Quit date: 09/08/1981    Years since quitting: 41.2   Smokeless tobacco: Never  Substance and Sexual Activity   Alcohol use: No   Drug use: No   Sexual activity: Not Currently  Other Topics Concern   Not on file  Social History Narrative   Not on file   Social Determinants of Health   Financial Resource Strain: Low Risk  (12/08/2022)   Overall Financial Resource Strain (CARDIA)    Difficulty of Paying Living Expenses: Not hard at all  Food Insecurity: No Food Insecurity (12/08/2022)   Hunger Vital Sign    Worried About Running Out of Food in the Last Year: Never true    Ran Out of Food in the Last Year: Never true  Transportation Needs: No Transportation Needs (12/08/2022)   PRAPARE - Hydrologist (Medical): No    Lack of Transportation (Non-Medical): No  Physical Activity: Sufficiently Active (12/08/2022)   Exercise Vital Sign    Days of Exercise per Week: 4 days    Minutes of  Exercise per Session: 60 min  Stress: No Stress Concern Present (12/08/2022)   [place]    Feeling of Stress : Only a little  Social Connections: Moderately Integrated (12/08/2022)   Social Connection and Isolation Panel [NHANES]    Frequency of Communication with Friends and Family: More than three times a week    Frequency of Social Gatherings with Friends and Family: Once a week    Attends Religious Services: More than 4 times per year    Active Member of [employer] or Organizations: Yes    Attends Archivist Meetings: More than 4 times per year    Marital Status: Widowed  Intimate Partner Violence: Not on file    Outpatient Medications Prior to Visit  Medication Sig Dispense Refill   acetaminophen (TYLENOL) 500 MG tablet Take 500 mg by mouth 2 (two) times daily as needed.      amLODipine (NORVASC) 5 MG tablet TAKE 1 TABLET BY MOUTH  DAILY 90 tablet 3   brimonidine (ALPHAGAN) 0.2 % ophthalmic solution Place 1 drop into both eyes in the morning and at bedtime.     Cholecalciferol (VITAMIN D) 50 MCG (2000 UT) tablet Take 1 tablet (2,000 Units total) by mouth daily.     clindamycin-benzoyl peroxide (BENZACLIN) gel      cyanocobalamin (VITAMIN B12) 500 MCG tablet Take 1 tablet (500 mcg total) by mouth daily. 100 tablet 2   dorzolamide-timolol (COSOPT) 22.3-6.8 MG/ML ophthalmic solution Place 1 drop into both eyes 2 times daily.     empagliflozin (JARDIANCE) 10 MG TABS tablet Take 10 mg by mouth daily.     escitalopram (LEXAPRO) 20 MG tablet Take 1 tablet (20 mg total) by mouth daily. 90 tablet 2   gabapentin (NEURONTIN) 100 MG capsule TAKE 1 TO 2 CAPSULES BY  MOUTH AT BEDTIME 180 capsule 3   glucose blood (ONETOUCH ULTRA) test strip Use as instructed 100 strip 3   Lancets MISC Please dispense based on patient and insurance preference. Use as directed to monitor FSBS 1x daily. Dx: E11.9. 100 each 1   LINZESS 72 MCG capsule TAKE 1 CAPSULE BY MOUTH  DAILY BEFORE BREAKFAST 90 capsule 3   montelukast (SINGULAIR) 10 MG tablet TAKE 1 TABLET BY MOUTH AT  BEDTIME 90 tablet 3   UNABLE TO FIND Tdap Vaccine DX : Z23 1 each 0   valsartan-hydrochlorothiazide (DIOVAN-HCT) 160-25 MG tablet TAKE 1 TABLET BY MOUTH DAILY 100 tablet 2   No facility-administered medications prior to visit.    No Known Allergies  Review of Systems  Constitutional:  Negative for chills and fever.  Eyes:  Negative for visual disturbance.  Respiratory:  Negative for chest tightness and shortness of breath.   Musculoskeletal:        Right wrist pain  Neurological:  Negative for dizziness and headaches.       Objective:    Physical Exam HENT:     Head: Normocephalic.     Mouth/Throat:     Mouth: Mucous membranes are moist.  Cardiovascular:     Rate and Rhythm: Normal rate.     Heart sounds: Normal  heart sounds.  Pulmonary:     Effort: Pulmonary effort is normal.     Breath sounds: Normal breath sounds.  Musculoskeletal:     Right wrist: Swelling and tenderness present. Normal range of motion.     Left wrist: No swelling or tenderness.     Right foot: Normal range  of motion. No swelling, deformity or tenderness.     Comments: Rt foot callus  Neurological:     Mental Status: She is alert.     BP 137/75   Pulse 75   Ht 5\' 8"  (1.727 m)   Wt 206 lb 1.9 oz (93.5 kg)   SpO2 97%   BMI 31.34 kg/m  Wt Readings from Last 3 Encounters:  12/09/22 206 lb 1.9 oz (93.5 kg)  11/26/22 203 lb 11.3 oz (92.4 kg)  07/23/22 203 lb (92.1 kg)       Assessment & Plan:  Right wrist pain Assessment & Plan: Ongoing symptoms since 11/17/2022 Encouraged alternating with ice and heat therapy at the affected site Encouraged the use of a wrist brace when swelling subsides Will treat today with prednisone 20 mg twice daily for 7 days Encouraged to take Tylenol as needed for pain relief Referral placed to orthopedic surgery for steroid injections, if possible Encouraged to avoid activities that aggravates her symptoms   right wrist pain -     Ambulatory referral to Orthopedic Surgery -     predniSONE; Take 1 tablet (20 mg total) by mouth 2 (two) times daily with a meal for 7 days.  Dispense: 14 tablet; Refill: 0  Callus of foot Assessment & Plan: Education as follow: Home treatment for foot callus includes: -Soak the corn or callus in warm water. Do this for about five to 10 minutes or until the skin softens. -Apply moisturizing lotion or cream to the area daily. Look for a moisturizing lotion or cream with salicylic acid, ammonium lactate, or urea. These ingredients will help gradually soften hard corns and calluses. -Use padding. To protect calluses from further irritation during activity, cut a piece of moleskin - available at your local drugstore - into two half-moon shapes and place around  the callus.     Alvira Monday, FNP

## 2022-12-09 NOTE — Assessment & Plan Note (Signed)
Ongoing symptoms since 11/17/2022 Encouraged alternating with ice and heat therapy at the affected site Encouraged the use of a wrist brace when swelling subsides Will treat today with prednisone 20 mg twice daily for 7 days Encouraged to take Tylenol as needed for pain relief Referral placed to orthopedic surgery for steroid injections, if possible Encouraged to avoid activities that aggravates her symptoms

## 2022-12-10 ENCOUNTER — Ambulatory Visit: Payer: Medicare Other | Admitting: Internal Medicine

## 2022-12-13 ENCOUNTER — Other Ambulatory Visit: Payer: Self-pay | Admitting: Family Medicine

## 2022-12-18 ENCOUNTER — Encounter: Payer: Self-pay | Admitting: Family Medicine

## 2022-12-18 ENCOUNTER — Telehealth (INDEPENDENT_AMBULATORY_CARE_PROVIDER_SITE_OTHER): Payer: Medicare Other | Admitting: Family Medicine

## 2022-12-18 VITALS — Ht 68.0 in | Wt 204.0 lb

## 2022-12-18 DIAGNOSIS — J40 Bronchitis, not specified as acute or chronic: Secondary | ICD-10-CM

## 2022-12-18 MED ORDER — PREDNISONE 20 MG PO TABS: 20.0000 mg | ORAL_TABLET | Freq: Two times a day (BID) | ORAL | 0 refills | Status: AC

## 2022-12-18 MED ORDER — FLUTICASONE PROPIONATE 50 MCG/ACT NA SUSP: 1.0000 | Freq: Every day | NASAL | 1 refills | Status: DC

## 2022-12-18 MED ORDER — BENZONATATE 100 MG PO CAPS: 100.0000 mg | ORAL_CAPSULE | Freq: Two times a day (BID) | ORAL | 0 refills | Status: DC | PRN

## 2022-12-18 NOTE — Progress Notes (Signed)
Virtual Visit via Video Note  I connected with Victoria MangesLinda M Mcconnell on 12/18/22 at 11:40 AM EDT by a video enabled telemedicine application and verified that I am speaking with the correct person using two identifiers.  Patient Location: Home Provider Location: Office/Clinic  I discussed the limitations, risks, security, and privacy concerns of performing an evaluation and management service by video and the availability of in person appointments. I also discussed with the patient that there may be a patient responsible charge related to this service. The patient expressed understanding and agreed to proceed.  Subjective: PCP: Kerri PerchesSimpson, Margaret E, MD  Chief Complaint  Patient presents with   Cough    Patient complains of scratchy throat, congestion and cough for 2 days.      Cough This is a new problem. The current episode started yesterday. The problem has been gradually worsening. The problem occurs constantly. The cough is Non-productive. Associated symptoms include chills, ear congestion, nasal congestion, rhinorrhea and a sore throat. Pertinent negatives include no ear pain, fever, headaches, heartburn, shortness of breath, sweats or wheezing. The symptoms are aggravated by stress. Risk factors for lung disease include smoking/tobacco exposure. She has tried OTC cough suppressant for the symptoms. The treatment provided no relief. Her past medical history is significant for asthma.     ROS: Per HPI  Current Outpatient Medications:    acetaminophen (TYLENOL) 500 MG tablet, Take 500 mg by mouth 2 (two) times daily as needed., Disp: , Rfl:    amLODipine (NORVASC) 5 MG tablet, TAKE 1 TABLET BY MOUTH DAILY, Disp: 100 tablet, Rfl: 2   benzonatate (TESSALON) 100 MG capsule, Take 1 capsule (100 mg total) by mouth 2 (two) times daily as needed for cough., Disp: 20 capsule, Rfl: 0   brimonidine (ALPHAGAN) 0.2 % ophthalmic solution, Place 1 drop into both eyes in the morning and at bedtime.,  Disp: , Rfl:    Cholecalciferol (VITAMIN D) 50 MCG (2000 UT) tablet, Take 1 tablet (2,000 Units total) by mouth daily., Disp: , Rfl:    clindamycin-benzoyl peroxide (BENZACLIN) gel, , Disp: , Rfl:    cyanocobalamin (VITAMIN B12) 500 MCG tablet, Take 1 tablet (500 mcg total) by mouth daily., Disp: 100 tablet, Rfl: 2   dorzolamide-timolol (COSOPT) 22.3-6.8 MG/ML ophthalmic solution, Place 1 drop into both eyes 2 times daily., Disp: , Rfl:    empagliflozin (JARDIANCE) 10 MG TABS tablet, Take 10 mg by mouth daily., Disp: , Rfl:    escitalopram (LEXAPRO) 20 MG tablet, Take 1 tablet (20 mg total) by mouth daily., Disp: 90 tablet, Rfl: 2   fluticasone (FLONASE) 50 MCG/ACT nasal spray, Place 1 spray into both nostrils daily., Disp: 16 g, Rfl: 1   gabapentin (NEURONTIN) 100 MG capsule, TAKE 1 TO 2 CAPSULES BY MOUTH AT BEDTIME, Disp: 200 capsule, Rfl: 2   glucose blood (ONETOUCH ULTRA) test strip, Use as instructed, Disp: 100 strip, Rfl: 3   Lancets MISC, Please dispense based on patient and insurance preference. Use as directed to monitor FSBS 1x daily. Dx: E11.9., Disp: 100 each, Rfl: 1   LINZESS 72 MCG capsule, TAKE 1 CAPSULE BY MOUTH  DAILY BEFORE BREAKFAST, Disp: 90 capsule, Rfl: 3   montelukast (SINGULAIR) 10 MG tablet, TAKE 1 TABLET BY MOUTH AT  BEDTIME, Disp: 100 tablet, Rfl: 2   predniSONE (DELTASONE) 20 MG tablet, Take 1 tablet (20 mg total) by mouth 2 (two) times daily with a meal for 5 days., Disp: 10 tablet, Rfl: 0   UNABLE  TO FIND, Tdap Vaccine DX : Z23, Disp: 1 each, Rfl: 0   valsartan-hydrochlorothiazide (DIOVAN-HCT) 160-25 MG tablet, TAKE 1 TABLET BY MOUTH DAILY, Disp: 100 tablet, Rfl: 2  Observations/Objective: Today's Vitals   12/18/22 1126  Weight: 204 lb (92.5 kg)  Height: 5\' 8"  (1.727 m)   Physical Exam Telehealth Visit- Patient is alert and no acute distress noted.   Assessment and Plan: Bronchitis Assessment & Plan: Prednisone 20 mg x 5 days Benzonatate 100 mg PRN and  Flonase nasal spray Discussed symptomatic treatment, rest, increase oral fluid intake.Follow-up for worsening or persistent symptoms. Patient verbalizes understanding regarding plan of care and all questions answered.   Orders: -     predniSONE; Take 1 tablet (20 mg total) by mouth 2 (two) times daily with a meal for 5 days.  Dispense: 10 tablet; Refill: 0 -     Fluticasone Propionate; Place 1 spray into both nostrils daily.  Dispense: 16 g; Refill: 1 -     Benzonatate; Take 1 capsule (100 mg total) by mouth 2 (two) times daily as needed for cough.  Dispense: 20 capsule; Refill: 0    Follow Up Instructions: No follow-ups on file.   I discussed the assessment and treatment plan with the patient. The patient was provided an opportunity to ask questions, and all were answered. The patient agreed with the plan and demonstrated an understanding of the instructions.   The patient was advised to call back or seek an in-person evaluation if the symptoms worsen or if the condition fails to improve as anticipated.  The above assessment and management plan was discussed with the patient. The patient verbalized understanding of and has agreed to the management plan.   Cruzita Lederer Newman Nip, FNP

## 2022-12-18 NOTE — Assessment & Plan Note (Addendum)
Prednisone 20 mg x 5 days Benzonatate 100 mg PRN and Flonase nasal spray Discussed symptomatic treatment, rest, increase oral fluid intake.Follow-up for worsening or persistent symptoms. Patient verbalizes understanding regarding plan of care and all questions answered.

## 2023-01-01 ENCOUNTER — Other Ambulatory Visit: Payer: Self-pay | Admitting: *Deleted

## 2023-01-01 DIAGNOSIS — E538 Deficiency of other specified B group vitamins: Secondary | ICD-10-CM

## 2023-01-01 MED ORDER — CYANOCOBALAMIN 500 MCG PO TABS: 500.0000 ug | ORAL_TABLET | Freq: Every day | ORAL | 3 refills | Status: DC

## 2023-01-01 NOTE — Telephone Encounter (Signed)
Okay per last office note to refill vitamin B-12.

## 2023-01-04 ENCOUNTER — Ambulatory Visit (INDEPENDENT_AMBULATORY_CARE_PROVIDER_SITE_OTHER): Payer: Medicare Other | Admitting: Family Medicine

## 2023-01-04 ENCOUNTER — Encounter: Payer: Self-pay | Admitting: Family Medicine

## 2023-01-04 DIAGNOSIS — Z0000 Encounter for general adult medical examination without abnormal findings: Secondary | ICD-10-CM

## 2023-01-04 DIAGNOSIS — I10 Essential (primary) hypertension: Secondary | ICD-10-CM

## 2023-01-04 DIAGNOSIS — Z0001 Encounter for general adult medical examination with abnormal findings: Secondary | ICD-10-CM | POA: Diagnosis not present

## 2023-01-04 DIAGNOSIS — Z0100 Encounter for examination of eyes and vision without abnormal findings: Secondary | ICD-10-CM

## 2023-01-04 NOTE — Progress Notes (Signed)
Subjective:   Victoria Mcconnell is a 77 y.o. female who presents for Medicare Annual (Subsequent) preventive examination. I connected with  Victoria Mcconnell on 01/04/23 by a audio enabled telemedicine application and verified that I am speaking with the correct person using two identifiers.  Patient Location: Home  Provider Location: Office/Clinic  I discussed the limitations of evaluation and management by telemedicine. The patient expressed understanding and agreed to proceed.  Review of Systems    Patient denies pain, fever, chills, chest pain, palpations ,shortness of breath, blurred vision,cough, abdominal pain, nausea, vomiting, headache, dizziness. Patient is not feeling nervous or anxious.    Cardiac Risk Factors include: advanced age (>28men, >54 women)     Objective:    Today's Vitals   01/04/23 1438  PainSc: 7    There is no height or weight on file to calculate BMI.     01/04/2023    3:55 PM 11/26/2022    9:39 AM 05/28/2022   10:45 AM 11/06/2021    8:46 AM 09/13/2019   10:13 AM 08/31/2018    2:53 PM 06/23/2018   10:36 AM  Advanced Directives  Does Patient Have a Medical Advance Directive? No No No No No No No  Would patient like information on creating a medical advance directive? No - Patient declined No - Patient declined No - Patient declined No - Patient declined Yes (MAU/Ambulatory/Procedural Areas - Information given) Yes (MAU/Ambulatory/Procedural Areas - Information given) No - Patient declined    Current Medications (verified) Outpatient Encounter Medications as of 01/04/2023  Medication Sig   acetaminophen (TYLENOL) 500 MG tablet Take 500 mg by mouth 2 (two) times daily as needed.   amLODipine (NORVASC) 5 MG tablet TAKE 1 TABLET BY MOUTH DAILY   brimonidine (ALPHAGAN) 0.2 % ophthalmic solution Place 1 drop into both eyes in the morning and at bedtime.   Cholecalciferol (VITAMIN D) 50 MCG (2000 UT) tablet Take 1 tablet (2,000 Units total) by mouth daily.    clindamycin-benzoyl peroxide (BENZACLIN) gel    cyanocobalamin (VITAMIN B12) 500 MCG tablet Take 1 tablet (500 mcg total) by mouth daily.   dorzolamide-timolol (COSOPT) 22.3-6.8 MG/ML ophthalmic solution Place 1 drop into both eyes 2 times daily.   empagliflozin (JARDIANCE) 10 MG TABS tablet Take 10 mg by mouth daily.   escitalopram (LEXAPRO) 20 MG tablet Take 1 tablet (20 mg total) by mouth daily.   fluticasone (FLONASE) 50 MCG/ACT nasal spray Place 1 spray into both nostrils daily.   gabapentin (NEURONTIN) 100 MG capsule TAKE 1 TO 2 CAPSULES BY MOUTH AT BEDTIME   glucose blood (ONETOUCH ULTRA) test strip Use as instructed   Lancets MISC Please dispense based on patient and insurance preference. Use as directed to monitor FSBS 1x daily. Dx: E11.9.   LINZESS 72 MCG capsule TAKE 1 CAPSULE BY MOUTH  DAILY BEFORE BREAKFAST   montelukast (SINGULAIR) 10 MG tablet TAKE 1 TABLET BY MOUTH AT  BEDTIME   UNABLE TO FIND Tdap Vaccine DX : Z23   valsartan-hydrochlorothiazide (DIOVAN-HCT) 160-25 MG tablet TAKE 1 TABLET BY MOUTH DAILY   [DISCONTINUED] benzonatate (TESSALON) 100 MG capsule Take 1 capsule (100 mg total) by mouth 2 (two) times daily as needed for cough.   No facility-administered encounter medications on file as of 01/04/2023.    Allergies (verified) Patient has no known allergies.   History: Past Medical History:  Diagnosis Date   Allergy    seasonal allergies   Anxiety    Arthritis  Depression, major, single episode, moderate 07/01/2015   Diabetes mellitus    Glaucoma    Hyperlipidemia    Hypertension    Osteopenia    Past Surgical History:  Procedure Laterality Date   ABDOMINAL HYSTERECTOMY     SPINE SURGERY N/A 11/11/2020   lam facetectomy & foramotomy 1 vrt sgm lumbar   Family History  Problem Relation Age of Onset   Arthritis Mother    Cancer Sister    Diabetes Sister    Hyperlipidemia Sister    Hypertension Sister    Social History   Socioeconomic History    Marital status: Widowed    Spouse name: Not on file   Number of children: Not on file   Years of education: Not on file   Highest education level: Bachelor's degree (e.g., BA, AB, BS)  Occupational History   Not on file  Tobacco Use   Smoking status: Former    Types: Cigarettes    Quit date: 09/08/1981    Years since quitting: 41.3   Smokeless tobacco: Never  Substance and Sexual Activity   Alcohol use: No   Drug use: No   Sexual activity: Not Currently  Other Topics Concern   Not on file  Social History Narrative   Not on file   Social Determinants of Health   Financial Resource Strain: Low Risk  (01/04/2023)   Overall Financial Resource Strain (CARDIA)    Difficulty of Paying Living Expenses: Not hard at all  Food Insecurity: No Food Insecurity (01/04/2023)   Hunger Vital Sign    Worried About Running Out of Food in the Last Year: Never true    Ran Out of Food in the Last Year: Never true  Transportation Needs: No Transportation Needs (01/04/2023)   PRAPARE - Administrator, Civil Service (Medical): No    Lack of Transportation (Non-Medical): No  Physical Activity: Sufficiently Active (01/04/2023)   Exercise Vital Sign    Days of Exercise per Week: 4 days    Minutes of Exercise per Session: 60 min  Stress: No Stress Concern Present (01/04/2023)   [employer] of Occupational Health - Occupational Stress Questionnaire    Feeling of Stress : Not at all  Social Connections: Moderately Integrated (01/04/2023)   Social Connection and Isolation Panel [NHANES]    Frequency of Communication with Friends and Family: More than three times a week    Frequency of Social Gatherings with Friends and Family: More than three times a week    Attends Religious Services: More than 4 times per year    Active Member of [employer] or Organizations: Yes    Attends Banker Meetings: More than 4 times per year    Marital Status: Widowed    Tobacco Counseling Counseling  given: Not Answered   Clinical Intake:  Pre-visit preparation completed: No  Pain : 0-10 Pain Score: 7  Pain Type: Chronic pain Pain Location: Wrist Pain Orientation: Right     Diabetes: Yes CBG done?: Yes CBG resulted in Enter/ Edit results?: Yes Did pt. bring in CBG monitor from home?: No  How often do you need to have someone help you when you read instructions, pamphlets, or other written materials from your doctor or pharmacy?: 1 - Never  Diabetic - Hemoglobin A1c 6.1  Nutrition Risk Assessment:  Has the patient had any N/V/D within the last 2 months?  No  Does the patient have any non-healing wounds?  No  Has the patient had any  unintentional weight loss or weight gain?  No   Diabetes:  Is the patient diabetic?  Yes  If diabetic, was a CBG obtained today?  Yes  Did the patient bring in their glucometer from home?  No  How often do you monitor your CBG's? 115.   Financial Strains and Diabetes Management:  Are you having any financial strains with the device, your supplies or your medication? No .  Does the patient want to be seen by Chronic Care Management for management of their diabetes?  No  Would the patient like to be referred to a Nutritionist or for Diabetic Management?  Yes   Diabetic Exams:  Diabetic Eye Exam: Completed 11/2022 Diabetic Foot Exam: Overdue, Pt has been advised about the importance in completing this exam. Pt is scheduled for diabetic foot exam on 01/22/23.  Interpreter Needed?: No  Information entered by :: Toria C., RMA   Activities of Daily Living    01/04/2023    2:37 PM 12/31/2022   11:05 AM  In your present state of health, do you have any difficulty performing the following activities:  Hearing? 0 0  Vision? 0 0  Difficulty concentrating or making decisions? 0 0  Walking or climbing stairs? 0 0  Dressing or bathing? 0 0  Doing errands, shopping? 0 0  Preparing Food and eating ? N N  Using the Toilet? N N  In the past  six months, have you accidently leaked urine? N N  Do you have problems with loss of bowel control? N N  Managing your Medications? N N  Managing your Finances? N N  Housekeeping or managing your Housekeeping? N N    Patient Care Team: Kerri Perches, MD as PCP - General (Family Medicine) Erroll Luna, [facility] as Pharmacist (Pharmacist)  Indicate any recent Medical Services you may have received from other than [facility] providers in the past year (date may be approximate).     Assessment:   This is a routine wellness examination for [place].  Hearing/Vision screen No results found.  Dietary issues and exercise activities discussed: Current Exercise Habits: Home exercise routine, Type of exercise: calisthenics, Time (Minutes): 60, Frequency (Times/Week): 4, Weekly Exercise (Minutes/Week): 240 Discussed DASH diet which includes vegetables,fruits,whole grains, fat free or low fat diary,fish,poultry,beans,nuts and seeds,vegetable oils. Find an activity that you will enjoy and start to be active at least 5 days a week for 30 minutes each day.    Goals Addressed   None   Depression Screen    01/04/2023    3:57 PM 12/09/2022   10:03 AM 01/20/2022   11:15 AM 12/15/2021    4:12 PM 11/20/2021   12:06 PM 11/20/2021   11:26 AM 08/21/2021   11:19 AM  PHQ 2/9 Scores  PHQ - 2 Score 0 0 0 0 4 0 0  PHQ- 9 Score  0  0 7  0    Fall Risk    01/04/2023    2:40 PM 12/31/2022   11:05 AM 12/09/2022   10:03 AM 01/20/2022   11:14 AM 12/15/2021    4:12 PM  Fall Risk   Falls in the past year? 0 0 0 0 0  Number falls in past yr: 0 0 0 0 0  Injury with Fall? 0 0 0 0 0  Risk for fall due to : No Fall Risks  No Fall Risks No Fall Risks No Fall Risks  Follow up Falls evaluation completed  Falls evaluation completed Falls evaluation  completed Falls evaluation completed    FALL RISK PREVENTION PERTAINING TO THE HOME:  Any stairs in or around the home? Yes  If so, are there any without handrails? No  Home  free of loose throw rugs in walkways, pet beds, electrical cords, etc? Yes  Adequate lighting in your home to reduce risk of falls? Yes   ASSISTIVE DEVICES UTILIZED TO PREVENT FALLS:  Life alert? No  Use of a cane, walker or w/c? No  Grab bars in the bathroom? Yes  Shower chair or bench in shower? No  Elevated toilet seat or a handicapped toilet? No   TIMED UP AND GO:  Was the test performed? No .    Cognitive Function:        01/04/2023    3:58 PM 12/15/2021    4:14 PM  6CIT Screen  What Year? 0 points 0 points  What month? 0 points 0 points  What time? 0 points 0 points  Count back from 20 0 points 0 points  Months in reverse 0 points 0 points  Repeat phrase 0 points 0 points  Total Score 0 points 0 points    Immunizations Immunization History  Administered Date(s) Administered   Fluad Quad(high Dose 65+) 05/23/2019, 06/27/2020, 07/09/2021, 06/03/2022   Influenza, High Dose Seasonal PF 07/07/2017   Influenza,inj,Quad PF,6+ Mos 07/01/2015, 05/18/2018   Influenza-Unspecified 08/16/2002, 01/20/2014, 07/06/2016   Moderna Covid-19 Vaccine Bivalent Booster 63yrs & up 12/31/2020   Moderna SARS-COV2 Booster Vaccination 12/22/2019   Moderna Sars-Covid-2 Vaccination 10/22/2019, 11/22/2019, 05/24/2020, 07/14/2022   Pneumococcal Conjugate-13 08/27/2015   Pneumococcal Polysaccharide-23 09/08/2013, 01/20/2014, 02/20/2021   Zoster Recombinat (Shingrix) 12/21/2020, 03/29/2021   Zoster, Live 09/13/2013    TDAP status: Due, Education has been provided regarding the importance of this vaccine. Advised may receive this vaccine at local pharmacy or Health Dept. Aware to provide a copy of the vaccination record if obtained from local pharmacy or Health Dept. Verbalized acceptance and understanding.  Flu Vaccine status: Up to date  Pneumococcal vaccine status: Up to date  Covid-19 vaccine status: Information provided on how to obtain vaccines.   Qualifies for Shingles Vaccine? Yes    Zostavax completed No   Shingrix Completed?: Yes  Screening Tests Health Maintenance  Topic Date Due   DTaP/Tdap/Td (1 - Tdap) Never done   Diabetic kidney evaluation - Urine ACR  01/11/2021   OPHTHALMOLOGY EXAM  06/11/2021   FOOT EXAM  10/16/2021   COVID-19 Vaccine (6 - 2023-24 season) 01/20/2023 (Originally 09/08/2022)   HEMOGLOBIN A1C  01/14/2023   INFLUENZA VACCINE  04/15/2023   Diabetic kidney evaluation - eGFR measurement  11/19/2023   Medicare Annual Wellness (AWV)  01/04/2024   COLONOSCOPY (Pts 45-57yrs Insurance coverage will need to be confirmed)  05/06/2031   Pneumonia Vaccine 24+ Years old  Completed   DEXA SCAN  Completed   Hepatitis C Screening  Completed   Zoster Vaccines- Shingrix  Completed   HPV VACCINES  Aged Out    Health Maintenance  Health Maintenance Due  Topic Date Due   DTaP/Tdap/Td (1 - Tdap) Never done   Diabetic kidney evaluation - Urine ACR  01/11/2021   OPHTHALMOLOGY EXAM  06/11/2021   FOOT EXAM  10/16/2021    Colorectal cancer screening: No longer required.   Mammogram status: No longer required due to age.  Bone Density status: Completed 05/25/18. Results reflect: Bone density results: OSTEOPENIA. Repeat every 2 years.  Lung Cancer Screening: (Low Dose CT Chest recommended if Age 60-80  years, 30 pack-year currently smoking OR have quit w/in 15years.) does not qualify.   Additional Screening:  Hepatitis C Screening: does qualify; Completed 08/17/17  Vision Screening: Recommended annual ophthalmology exams for early detection of glaucoma and other disorders of the eye. Is the patient up to date with their annual eye exam?  Yes  Who is the provider or what is the name of the office in which the patient attends annual eye exams? Dr. Anette Guarneri    Dental Screening: Recommended annual dental exams for proper oral hygiene  Community Resource Referral / Chronic Care Management: CRR required this visit?  No   CCM required this visit?  No       Plan:     I have personally reviewed and noted the following in the patient's chart:   Medical and social history Use of alcohol, tobacco or illicit drugs  Current medications and supplements including opioid prescriptions. Patient is not currently taking opioid prescriptions. Functional ability and status Nutritional status Physical activity Advanced directives List of other physicians Hospitalizations, surgeries, and ER visits in previous 12 months Vitals Screenings to include cognitive, depression, and falls Referrals and appointments  In addition, I have reviewed and discussed with patient certain preventive protocols, quality metrics, and best practice recommendations. A written personalized care plan for preventive services as well as general preventive health recommendations were provided to patient.     Maxie Barb, CMA   01/04/2023  Rica Records, FNP            01/04/2023  Nurse Notes: Non face to face 12 minutes.  Ms. Smay , Thank you for taking time to come for your Medicare Wellness Visit. I appreciate your ongoing commitment to your health goals. Please review the following plan we discussed and let me know if I can assist you in the future.   These are the goals we discussed:  Goals      CCM Expected Outcome:  Monitor, Self-Manage and Reduce Symptoms of:     Patient Stated     Patient states that her goal is to try to stay well        This is a list of the screening recommended for you and due dates:  Health Maintenance  Topic Date Due   DTaP/Tdap/Td vaccine (1 - Tdap) Never done   Yearly kidney health urinalysis for diabetes  01/11/2021   Eye exam for diabetics  06/11/2021   Complete foot exam   10/16/2021   COVID-19 Vaccine (6 - 2023-24 season) 01/20/2023*   Hemoglobin A1C  01/14/2023   Flu Shot  04/15/2023   Yearly kidney function blood test for diabetes  11/19/2023   Medicare Annual Wellness Visit  01/04/2024   Colon Cancer  Screening  05/06/2031   Pneumonia Vaccine  Completed   DEXA scan (bone density measurement)  Completed   Hepatitis C Screening: USPSTF Recommendation to screen - Ages 68-79 yo.  Completed   Zoster (Shingles) Vaccine  Completed   HPV Vaccine  Aged Out  *Topic was postponed. The date shown is not the original due date.

## 2023-01-05 ENCOUNTER — Telehealth: Payer: Self-pay | Admitting: *Deleted

## 2023-01-05 NOTE — Progress Notes (Signed)
  Care Coordination   Note   01/05/2023 Name: Victoria Mcconnell MRN: [id number] DOB: [birth year]  [name] is a 77 y.o. year old female who sees Lodema Hong, Milus Mallick, MD for primary care. I reached out to [name] by phone today to offer care coordination services.  Ms. Morrow was given information about Care Coordination services today including:   The Care Coordination services include support from the care team which includes your Nurse Coordinator, Clinical Social Worker, or Pharmacist.  The Care Coordination team is here to help remove barriers to the health concerns and goals most important to you. Care Coordination services are voluntary, and the patient may decline or stop services at any time by request to their care team member.   Care Coordination Consent Status: Patient agreed to services and verbal consent obtained.   Follow up plan:  Telephone appointment with care coordination team member scheduled for:  01/11/23  Encounter Outcome:  Pt. Scheduled  [facility] Coordination Care Guide  Direct Dial: [phone]

## 2023-01-07 ENCOUNTER — Other Ambulatory Visit: Payer: Self-pay

## 2023-01-07 DIAGNOSIS — J40 Bronchitis, not specified as acute or chronic: Secondary | ICD-10-CM

## 2023-01-07 MED ORDER — FLUTICASONE PROPIONATE 50 MCG/ACT NA SUSP: 1.0000 | Freq: Every day | NASAL | 1 refills | Status: DC

## 2023-01-11 ENCOUNTER — Ambulatory Visit: Payer: Self-pay | Admitting: *Deleted

## 2023-01-11 NOTE — Patient Instructions (Addendum)
Visit Information  Thank you for taking time to visit with me today. Please don't hesitate to contact me if I can be of assistance to you.    [email or web address]  Following are the goals we discussed today:   Goals Addressed             This Visit's Progress    [facility] care coordination services       Interventions Today    Flowsheet Row Most Recent Value  Chronic Disease   Chronic disease during today's visit Diabetes, Chronic Kidney Disease/End Stage Renal Disease (ESRD), Other  General Interventions   General Interventions Discussed/Reviewed General Interventions Discussed, Doctor Visits, Durable Medical Equipment (DME), Labs, Annual Eye Exam, Annual Foot Exam, Community Resources  Labs Hgb A1c every 6 months  Doctor Visits Discussed/Reviewed Doctor Visits Discussed, PCP, Specialist  Durable Medical Equipment (DME) Glucomoter  PCP/Specialist Visits Compliance with follow-up visit  Exercise Interventions   Exercise Discussed/Reviewed Exercise Discussed, Physical Activity  Physical Activity Discussed/Reviewed Physical Activity Discussed, Types of exercise, Gym  [Go to the gym and ride the bike for an hour]  Education Interventions   Education Provided Provided Web-based Education, Provided Education  [Web based education on BUN, Creatinine, dehydration, wrist pain, joint steroid injection]  Provided Verbal Education On Nutrition, Foot Care, Eye Care, Labs, Blood Sugar Monitoring, Exercise, Medication  Labs Reviewed Hgb A1c  Mental Health Interventions   Mental Health Discussed/Reviewed Mental Health Discussed, Coping Strategies  Nutrition Interventions   Nutrition Discussed/Reviewed Nutrition Discussed, Portion sizes, Fluid intake, Decreasing sugar intake, Carbohydrate meal planning  Pharmacy Interventions   Pharmacy Dicussed/Reviewed Pharmacy Topics Discussed, Affording Medications  Safety Interventions   Safety Discussed/Reviewed Safety  Discussed, Home Safety              Our next appointment is by telephone on 02/10/23 at 1000  Please call the care guide team at [phone] if you need to cancel or reschedule your appointment.   If you are experiencing a Mental Health or Behavioral Health Crisis or need someone to talk to, please call the Suicide and Crisis Lifeline: 988 call the [place] National Suicide Prevention Lifeline: 512-146-4725 or TTY: (936)485-3174 TTY (407)627-6138) to talk to a trained counselor call 1-800-273-TALK (toll free, 24 hour hotline) call the [facility]: 4406827368 call 911   Patient verbalizes understanding of instructions and care plan provided today and agrees to view in MyChart. Active MyChart status and patient understanding of how to access instructions and care plan via MyChart confirmed with patient.     The patient has been provided with contact information for the care management team and has been advised to call with any health related questions or concerns.    Baker Moronta L. Noelle Penner, RN, BSN, CCM [facility] Care Management Community Coordinator Office number [phone]

## 2023-01-11 NOTE — Patient Outreach (Signed)
  Care Coordination   Initial Visit Note   01/11/2023 Name: Victoria Mcconnell MRN: [id number] DOB: September 22, 1945  Victoria Mcconnell is a 77 y.o. year old female who sees Lodema Hong, Milus Mallick, MD for primary care. I spoke with  Victoria Mcconnell by phone today.  What matters to the patients health and wellness today?  Still having wrist pain even after use of prescribed brace for right wrist Has completed her oral prednisone which is reported to have helped some Works with computer frequently  Callus of right foot  she "had a bout" of pain over the weekend   Not connected with a podiatrist nor orthopedic provider at this time Pending an orthopedic referral  Patient questioned if her shoes used while riding the bike at her gym were too tight- denies rubbing on callus by her shoes  Diabetes last HgA1c 6.1 07/16/22 portions discussed, Manages well with Jardiance  Cough much better with use of Perles   Bilateral Glaucoma Ophthalmologist Dr Nile Riggs is retiring in June 2024 - she has been seeing him every 4 months Patient preference for providers are in [place] or [facility]   Renal lab elevations/abnormalities elevated BUN/Creatinine 11/19/22 - will work on staying hydrated & monitor intake of protein   Goals Addressed             This Visit's Progress    [facility] care coordination services       Interventions Today    Flowsheet Row Most Recent Value  Chronic Disease   Chronic disease during today's visit Diabetes, Chronic Kidney Disease/End Stage Renal Disease (ESRD), Other  General Interventions   General Interventions Discussed/Reviewed General Interventions Discussed, Doctor Visits, Durable Medical Equipment (DME), Labs, Annual Eye Exam, Annual Foot Exam, Community Resources  Labs Hgb A1c every 6 months  Doctor Visits Discussed/Reviewed Doctor Visits Discussed, PCP, Specialist  Durable Medical Equipment (DME) Glucomoter  PCP/Specialist Visits Compliance with follow-up visit  Exercise  Interventions   Exercise Discussed/Reviewed Exercise Discussed, Physical Activity  Physical Activity Discussed/Reviewed Physical Activity Discussed, Types of exercise, Gym  [Go to the gym and ride the bike for an hour]  Education Interventions   Education Provided Provided Web-based Education, Provided Education  [Web based education on BUN, Creatinine, dehydration, wrist pain, joint steroid injection]  Provided Verbal Education On Nutrition, Foot Care, Eye Care, Labs, Blood Sugar Monitoring, Exercise, Medication  Labs Reviewed Hgb A1c  Mental Health Interventions   Mental Health Discussed/Reviewed Mental Health Discussed, Coping Strategies  Nutrition Interventions   Nutrition Discussed/Reviewed Nutrition Discussed, Portion sizes, Fluid intake, Decreasing sugar intake, Carbohydrate meal planning  Pharmacy Interventions   Pharmacy Dicussed/Reviewed Pharmacy Topics Discussed, Affording Medications  Safety Interventions   Safety Discussed/Reviewed Safety Discussed, Home Safety              SDOH assessments and interventions completed:  Yes     Care Coordination Interventions:  Yes, provided   Follow up plan: Follow up call scheduled for 02/10/23    Encounter Outcome:  Pt. Visit Completed   Aslan Himes L. Noelle Penner, RN, BSN, CCM [facility] Care Management Community Coordinator Office number [phone]

## 2023-01-18 DIAGNOSIS — I10 Essential (primary) hypertension: Secondary | ICD-10-CM | POA: Diagnosis not present

## 2023-01-18 DIAGNOSIS — R7303 Prediabetes: Secondary | ICD-10-CM | POA: Diagnosis not present

## 2023-01-19 LAB — HEMOGLOBIN A1C
Est. average glucose Bld gHb Est-mCnc: 128 mg/dL
Hgb A1c MFr Bld: 6.1 % — ABNORMAL HIGH (ref 4.8–5.6)

## 2023-01-19 LAB — LIPID PANEL
Chol/HDL Ratio: 3.5 ratio (ref 0.0–4.4)
Cholesterol, Total: 245 mg/dL — ABNORMAL HIGH (ref 100–199)
HDL: 71 mg/dL (ref >39)
LDL Chol Calc (NIH): 152 mg/dL — ABNORMAL HIGH (ref 0–99)
Triglycerides: 125 mg/dL (ref 0–149)
VLDL Cholesterol Cal: 22 mg/dL (ref 5–40)

## 2023-01-19 LAB — CMP14+EGFR
ALT: 10 IU/L (ref 0–32)
AST: 16 IU/L (ref 0–40)
Albumin/Globulin Ratio: 1.5 (ref 1.2–2.2)
Albumin: 4.1 g/dL (ref 3.8–4.8)
Alkaline Phosphatase: 80 IU/L (ref 44–121)
BUN/Creatinine Ratio: 11 — ABNORMAL LOW (ref 12–28)
BUN: 12 mg/dL (ref 8–27)
Bilirubin Total: 0.3 mg/dL (ref 0.0–1.2)
CO2: 23 mmol/L (ref 20–29)
Calcium: 10 mg/dL (ref 8.7–10.3)
Chloride: 105 mmol/L (ref 96–106)
Creatinine, Ser: 1.06 mg/dL — ABNORMAL HIGH (ref 0.57–1.00)
Globulin, Total: 2.7 g/dL (ref 1.5–4.5)
Glucose: 99 mg/dL (ref 70–99)
Potassium: 3.6 mmol/L (ref 3.5–5.2)
Sodium: 142 mmol/L (ref 134–144)
Total Protein: 6.8 g/dL (ref 6.0–8.5)
eGFR: 55 mL/min/{1.73_m2} — ABNORMAL LOW (ref >59)

## 2023-01-20 ENCOUNTER — Other Ambulatory Visit (INDEPENDENT_AMBULATORY_CARE_PROVIDER_SITE_OTHER): Payer: Medicare Other

## 2023-01-20 ENCOUNTER — Ambulatory Visit: Payer: Medicare Other | Admitting: Orthopedic Surgery

## 2023-01-20 ENCOUNTER — Encounter: Payer: Self-pay | Admitting: Orthopedic Surgery

## 2023-01-20 VITALS — BP 124/74 | HR 70 | Ht 68.0 in | Wt 209.0 lb

## 2023-01-20 DIAGNOSIS — M25531 Pain in right wrist: Secondary | ICD-10-CM

## 2023-01-20 NOTE — Progress Notes (Signed)
New Patient Visit  Assessment: Victoria Mcconnell is a 77 y.o. female with the following: 1. Pain in right wrist  Plan: NEHA WALDRIDGE has started to have pain in the right wrist over the past month.  No specific injury.  She isolates the pain to the wrist.  She notes some swelling.  She has tried over-the-counter medications, bracing as well as topical treatments.  She has had no improvement in her symptoms.  Pain gets worse with continued movements and activities.  We discussed radiographs, which demonstrates some mild degenerative changes.  I offered her steroid injection, and she elected proceed.  This was completed in clinic today without issues.  She will follow-up as needed.  Procedure note injection - Right Wrist joint   Verbal consent was obtained to inject the Right wrist joint  Timeout was completed to confirm the site of injection.  The skin was prepped with alcohol and ethyl chloride was sprayed at the injection site.  A 21-gauge needle was used to inject 40 mg of Depo Medrol and 1% lidocaine (1 cc) into the Right wrist using a direct dorsal approach.  There were no complications. A sterile bandage was applied.   Follow-up: Return if symptoms worsen or fail to improve.  Subjective:  Chief Complaint  Patient presents with   Wrist Pain    R wrist pain for 1 mo, no injuries or pain going up arm or into fingers.     History of Present Illness: Victoria Mcconnell is a 77 y.o. female who has been referred by Gilmore Laroche, FNP for evaluation of right wrist pain.  She has had pain over the past months.  No specific injury.  She has noticed some swelling about the wrist.  She has tried medications, bracing, topical medications all with minimal sustained relief.  She has pain with flexion extension of the right wrist.  No numbness or tingling.  No shooting pains.  Pain gets worse when she moves it.   Review of Systems: No fevers or chills No numbness or tingling No chest pain No  shortness of breath No bowel or bladder dysfunction No GI distress No headaches   Medical History:  Past Medical History:  Diagnosis Date   Allergy    seasonal allergies   Anxiety    Arthritis    Depression, major, single episode, moderate (HCC) 07/01/2015   Diabetes mellitus    Glaucoma    Hyperlipidemia    Hypertension    Osteopenia     Past Surgical History:  Procedure Laterality Date   ABDOMINAL HYSTERECTOMY     SPINE SURGERY N/A 11/11/2020   lam facetectomy & foramotomy 1 vrt sgm lumbar    Family History  Problem Relation Age of Onset   Arthritis Mother    Cancer Sister    Diabetes Sister    Hyperlipidemia Sister    Hypertension Sister    Social History   Tobacco Use   Smoking status: Former    Types: Cigarettes    Quit date: 09/08/1981    Years since quitting: 41.3   Smokeless tobacco: Never  Substance Use Topics   Alcohol use: No   Drug use: No    No Known Allergies  Current Meds  Medication Sig   acetaminophen (TYLENOL) 500 MG tablet Take 500 mg by mouth 2 (two) times daily as needed.   amLODipine (NORVASC) 5 MG tablet TAKE 1 TABLET BY MOUTH DAILY   brimonidine (ALPHAGAN) 0.2 % ophthalmic solution Place 1 drop  into both eyes in the morning and at bedtime.   Cholecalciferol (VITAMIN D) 50 MCG (2000 UT) tablet Take 1 tablet (2,000 Units total) by mouth daily.   clindamycin-benzoyl peroxide (BENZACLIN) gel    cyanocobalamin (VITAMIN B12) 500 MCG tablet Take 1 tablet (500 mcg total) by mouth daily.   dorzolamide-timolol (COSOPT) 22.3-6.8 MG/ML ophthalmic solution Place 1 drop into both eyes 2 times daily.   empagliflozin (JARDIANCE) 10 MG TABS tablet Take 10 mg by mouth daily.   escitalopram (LEXAPRO) 20 MG tablet Take 1 tablet (20 mg total) by mouth daily.   fluticasone (FLONASE) 50 MCG/ACT nasal spray Place 1 spray into both nostrils daily.   gabapentin (NEURONTIN) 100 MG capsule TAKE 1 TO 2 CAPSULES BY MOUTH AT BEDTIME   glucose blood (ONETOUCH  ULTRA) test strip Use as instructed   Lancets MISC Please dispense based on patient and insurance preference. Use as directed to monitor FSBS 1x daily. Dx: E11.9.   LINZESS 72 MCG capsule TAKE 1 CAPSULE BY MOUTH  DAILY BEFORE BREAKFAST   montelukast (SINGULAIR) 10 MG tablet TAKE 1 TABLET BY MOUTH AT  BEDTIME   UNABLE TO FIND Tdap Vaccine DX : Z23   valsartan-hydrochlorothiazide (DIOVAN-HCT) 160-25 MG tablet TAKE 1 TABLET BY MOUTH DAILY    Objective: BP 124/74   Pulse 70   Ht 5\' 8"  (1.727 m)   Wt 209 lb (94.8 kg)   BMI 31.78 kg/m   Physical Exam:  General: Alert and oriented. and No acute distress. Gait: Normal gait.  Evaluation of the right wrist demonstrates some mild swelling dorsally.  No bruising.  She has mild tenderness to palpation along the wrist joint.  No tenderness over the first dorsal compartment.  No pain with CMC grind testing.  Pain gets worse with flexion and extension.  Mild discomfort with radial and ulnar deviation.  Fingers warm and well-perfused.  Sensation intact throughout the left hand.  IMAGING: I personally ordered and reviewed the following images   X-rays of the right wrist were obtained in clinic today.  No acute injuries are noted.  Mild loss of joint space within the radiocarpal joint.  There are some degenerative changes at the first [facility] joint.  No evidence of SL interval widening.  Appropriate mineralization.  No bony lesions.  Impression: Negative right wrist x-ray with mild degenerative changes overall.   New Medications:  No orders of the defined types were placed in this encounter.     Oliver Barre, MD  01/20/2023 3:37 PM

## 2023-01-20 NOTE — Patient Instructions (Signed)

## 2023-01-22 ENCOUNTER — Encounter: Payer: Self-pay | Admitting: Family Medicine

## 2023-01-22 ENCOUNTER — Other Ambulatory Visit: Payer: Self-pay

## 2023-01-22 ENCOUNTER — Ambulatory Visit (INDEPENDENT_AMBULATORY_CARE_PROVIDER_SITE_OTHER): Payer: Medicare Other | Admitting: Family Medicine

## 2023-01-22 VITALS — BP 120/75 | HR 70 | Ht 68.0 in | Wt 207.0 lb

## 2023-01-22 DIAGNOSIS — B351 Tinea unguium: Secondary | ICD-10-CM

## 2023-01-22 DIAGNOSIS — F329 Major depressive disorder, single episode, unspecified: Secondary | ICD-10-CM

## 2023-01-22 DIAGNOSIS — E1159 Type 2 diabetes mellitus with other circulatory complications: Secondary | ICD-10-CM

## 2023-01-22 DIAGNOSIS — D472 Monoclonal gammopathy: Secondary | ICD-10-CM | POA: Diagnosis not present

## 2023-01-22 DIAGNOSIS — H409 Unspecified glaucoma: Secondary | ICD-10-CM

## 2023-01-22 DIAGNOSIS — I10 Essential (primary) hypertension: Secondary | ICD-10-CM | POA: Diagnosis not present

## 2023-01-22 DIAGNOSIS — Z7984 Long term (current) use of oral hypoglycemic drugs: Secondary | ICD-10-CM | POA: Diagnosis not present

## 2023-01-22 DIAGNOSIS — E1169 Type 2 diabetes mellitus with other specified complication: Secondary | ICD-10-CM | POA: Insufficient documentation

## 2023-01-22 DIAGNOSIS — L700 Acne vulgaris: Secondary | ICD-10-CM

## 2023-01-22 DIAGNOSIS — E6609 Other obesity due to excess calories: Secondary | ICD-10-CM | POA: Diagnosis not present

## 2023-01-22 MED ORDER — ATORVASTATIN CALCIUM 40 MG PO TABS
40.0000 mg | ORAL_TABLET | Freq: Every day | ORAL | 3 refills | Status: DC
Start: 2023-01-22 — End: 2024-01-10

## 2023-01-22 MED ORDER — TERBINAFINE HCL 250 MG PO TABS: 250.0000 mg | ORAL_TABLET | Freq: Every day | ORAL | 1 refills | Status: DC

## 2023-01-22 MED ORDER — EMPAGLIFLOZIN 25 MG PO TABS
25.0000 mg | ORAL_TABLET | Freq: Every day | ORAL | 5 refills | Status: DC
Start: 2023-01-22 — End: 2023-04-14

## 2023-01-22 NOTE — Assessment & Plan Note (Addendum)
Diagnosed approx 20 years ago, needs new Provider, Nile Riggs

## 2023-01-22 NOTE — Patient Instructions (Signed)
Annual exam in 6 to 8 weeks, call if you need me sooner  Dose increase in Jardiance to 25 mg 1 daily.  Please get nonfasting Chem-7 and EGFR  To 7 days before your next visit.  Cholesterol is very high.  Please reduce and cut out fried and fatty foods.  New is atorvastatin 40 mg daily for cholesterol.  New medication to treat fungal toenail infection is terbinafine tablet 1 daily for a total of 12 weeks.  You are referred for diabetic eye exam with glaucoma.  You are referred to podiatry.  Thanks for choosing [facility], we consider it a privelige to serve you.

## 2023-01-22 NOTE — Assessment & Plan Note (Signed)
Victoria Mcconnell is reminded of the importance of commitment to daily physical activity for 30 minutes or more, as able and the need to limit carbohydrate intake to 30 to 60 grams per meal to help with blood sugar control.   The need to take medication as prescribed, test blood sugar as directed, and to call between visits if there is a concern that blood sugar is uncontrolled is also discussed.   Victoria Mcconnell is reminded of the importance of daily foot exam, annual eye examination, and good blood sugar, blood pressure and cholesterol control.     Latest Ref Rng & Units 01/18/2023   10:32 AM 11/19/2022   10:45 AM 07/16/2022    8:41 AM 05/21/2022   10:43 AM 01/20/2022   11:36 AM  Diabetic Labs  HbA1c 4.8 - 5.6 % 6.1   6.1   5.7   Chol 100 - 199 mg/dL 782   956     HDL >21 mg/dL 71   80     Calc LDL 0 - 99 mg/dL 308   657     Triglycerides 0 - 149 mg/dL 846   83     Creatinine 0.57 - 1.00 mg/dL 9.62  9.52  8.41  3.24        01/22/2023   10:52 AM 01/20/2023    3:04 PM 12/18/2022   11:26 AM 12/09/2022    9:59 AM 11/26/2022    9:40 AM 11/17/2022    8:26 AM 07/23/2022   11:10 AM  BP/Weight  Systolic BP 120 124  137 124 127 119  Diastolic BP 75 74  75 73 78 76  Wt. (Lbs) 207 209 204 206.12 203.71  203  BMI 31.47 kg/m2 31.78 kg/m2 31.02 kg/m2 31.34 kg/m2 30.97 kg/m2  30.87 kg/m2      01/22/2023   10:40 AM 09/13/2019   10:30 AM  Foot/eye exam completion dates  Foot Form Completion Done Done      Increase dose jardiance

## 2023-01-25 DIAGNOSIS — B351 Tinea unguium: Secondary | ICD-10-CM | POA: Insufficient documentation

## 2023-01-25 MED ORDER — ESCITALOPRAM OXALATE 20 MG PO TABS: 20.0000 mg | ORAL_TABLET | Freq: Every day | ORAL | 2 refills | Status: DC

## 2023-01-25 NOTE — Progress Notes (Signed)
Victoria Mcconnell     MRN: [id number]      DOB: [birth year]  Chief Complaint  Patient presents with   Follow-up    Follow up hasn't heard from referrals  C/o thickenng and cracking of toenails  HPI Victoria Mcconnell is here for follow up and re-evaluation of chronic medical conditions, medication management and review of any available recent lab and radiology data.  Preventive health is updated, specifically  Cancer screening and Immunization.   Questions or concerns regarding consultations or procedures which the PT has had in the interim are  addressed. The PT denies any adverse reactions to current medications since the last visit.  There are no new concerns.   ROS Denies recent fever or chills. Denies sinus pressure, nasal congestion, ear pain or sore throat. Denies chest congestion, productive cough or wheezing. Denies chest pains, palpitations and leg swelling Denies abdominal pain, nausea, vomiting,diarrhea or constipation.   Denies dysuria, frequency, hesitancy or incontinence. Denies ucontrolled  joint pain, swelling and limitation in mobility. Denies headaches, seizures, numbness, or tingling. Denies uncontrolled depression, anxiety or insomnia.  PE  BP 120/75 (BP Location: Right Arm, Patient Position: Sitting, Cuff Size: Large)   Pulse 70   Ht 5\' 8"  (1.727 m)   Wt 207 lb (93.9 kg)   SpO2 96%   BMI 31.47 kg/m   Patient alert and oriented and in no cardiopulmonary distress.  HEENT: No facial asymmetry, EOMI,     Neck supple .  Chest: Clear to auscultation bilaterally.  CVS: S1, S2 no murmurs, no S3.Regular rate.  ABD: Soft non tender.   Ext: No edema  MS: Adequate though reduced  ROM spine, shoulders, hips and knees.  Skin: Intact, no ulcerations or rash noted.Onychomycosis present  Psych: Good eye contact, normal affect. Memory intact not anxious or depressed appearing.  CNS: CN 2-12 intact, power,  normal throughout.no focal deficits noted.   Assessment &  Plan  Glaucoma Diagnosed approx 20 years ago, needs new Provider, Nile Riggs   Type 2 diabetes mellitus with vascular disease (HCC) Victoria Mcconnell is reminded of the importance of commitment to daily physical activity for 30 minutes or more, as able and the need to limit carbohydrate intake to 30 to 60 grams per meal to help with blood sugar control.   The need to take medication as prescribed, test blood sugar as directed, and to call between visits if there is a concern that blood sugar is uncontrolled is also discussed.   Victoria Mcconnell is reminded of the importance of daily foot exam, annual eye examination, and good blood sugar, blood pressure and cholesterol control.     Latest Ref Rng & Units 01/18/2023   10:32 AM 11/19/2022   10:45 AM 07/16/2022    8:41 AM 05/21/2022   10:43 AM 01/20/2022   11:36 AM  Diabetic Labs  HbA1c 4.8 - 5.6 % 6.1   6.1   5.7   Chol 100 - 199 mg/dL 562   130     HDL >86 mg/dL 71   80     Calc LDL 0 - 99 mg/dL 578   469     Triglycerides 0 - 149 mg/dL 629   83     Creatinine 0.57 - 1.00 mg/dL 5.28  4.13  2.44  0.10        01/22/2023   10:52 AM 01/20/2023    3:04 PM 12/18/2022   11:26 AM 12/09/2022    9:59 AM 11/26/2022  9:40 AM 11/17/2022    8:26 AM 07/23/2022   11:10 AM  BP/Weight  Systolic BP 120 124  137 124 127 119  Diastolic BP 75 74  75 73 78 76  Wt. (Lbs) 207 209 204 206.12 203.71  203  BMI 31.47 kg/m2 31.78 kg/m2 31.02 kg/m2 31.34 kg/m2 30.97 kg/m2  30.87 kg/m2      01/22/2023   10:40 AM 09/13/2019   10:30 AM  Foot/eye exam completion dates  Foot Form Completion Done Done      Increase dose jardiance when next filling Updated lab needed at/ before next visit.   Obesity  Patient re-educated about  the importance of commitment to a  minimum of 150 minutes of exercise per week as able.  The importance of healthy food choices with portion control discussed, as well as eating regularly and within a 12 hour window most days. The need to choose "clean ,  green" food 50 to 75% of the time is discussed, as well as to make water the primary drink and set a goal of 64 ounces water daily.       01/22/2023   10:52 AM 01/20/2023    3:04 PM 12/18/2022   11:26 AM  Weight /BMI  Weight 207 lb 209 lb 204 lb  Height 5\' 8"  (1.727 m) 5\' 8"  (1.727 m) 5\' 8"  (1.727 m)  BMI 31.47 kg/m2 31.78 kg/m2 31.02 kg/m2    Unchanged  IgG Lamda MGUS Annual f/u with Oncology  Acne vulgaris Treated by Dematology  Depression Controlled, no change in medication   Onychomycosis Terbinafine x 12 weeks  Essential (primary) hypertension Controlled, no change in medication DASH diet and commitment to daily physical activity for a minimum of 30 minutes discussed and encouraged, as a part of hypertension management. The importance of attaining a healthy weight is also discussed.     01/22/2023   10:52 AM 01/20/2023    3:04 PM 12/18/2022   11:26 AM 12/09/2022    9:59 AM 11/26/2022    9:40 AM 11/17/2022    8:26 AM 07/23/2022   11:10 AM  BP/Weight  Systolic BP 120 124  137 124 127 119  Diastolic BP 75 74  75 73 78 76  Wt. (Lbs) 207 209 204 206.12 203.71  203  BMI 31.47 kg/m2 31.78 kg/m2 31.02 kg/m2 31.34 kg/m2 30.97 kg/m2  30.87 kg/m2

## 2023-01-25 NOTE — Assessment & Plan Note (Signed)
Controlled, no change in medication DASH diet and commitment to daily physical activity for a minimum of 30 minutes discussed and encouraged, as a part of hypertension management. The importance of attaining a healthy weight is also discussed.     01/22/2023   10:52 AM 01/20/2023    3:04 PM 12/18/2022   11:26 AM 12/09/2022    9:59 AM 11/26/2022    9:40 AM 11/17/2022    8:26 AM 07/23/2022   11:10 AM  BP/Weight  Systolic BP 120 124  137 124 127 119  Diastolic BP 75 74  75 73 78 76  Wt. (Lbs) 207 209 204 206.12 203.71  203  BMI 31.47 kg/m2 31.78 kg/m2 31.02 kg/m2 31.34 kg/m2 30.97 kg/m2  30.87 kg/m2

## 2023-01-25 NOTE — Assessment & Plan Note (Signed)
Terbinafine x 12 weeks 

## 2023-01-25 NOTE — Assessment & Plan Note (Signed)
  Patient re-educated about  the importance of commitment to a  minimum of 150 minutes of exercise per week as able.  The importance of healthy food choices with portion control discussed, as well as eating regularly and within a 12 hour window most days. The need to choose "clean , green" food 50 to 75% of the time is discussed, as well as to make water the primary drink and set a goal of 64 ounces water daily.       01/22/2023   10:52 AM 01/20/2023    3:04 PM 12/18/2022   11:26 AM  Weight /BMI  Weight 207 lb 209 lb 204 lb  Height 5\' 8"  (1.727 m) 5\' 8"  (1.727 m) 5\' 8"  (1.727 m)  BMI 31.47 kg/m2 31.78 kg/m2 31.02 kg/m2    Unchanged

## 2023-01-25 NOTE — Assessment & Plan Note (Signed)
Controlled, no change in medication  

## 2023-01-25 NOTE — Assessment & Plan Note (Signed)
Treated by Dematology

## 2023-01-25 NOTE — Assessment & Plan Note (Signed)
Annual f/u with Oncology

## 2023-02-01 ENCOUNTER — Ambulatory Visit: Payer: Medicare Other | Admitting: Podiatry

## 2023-02-01 DIAGNOSIS — M2041 Other hammer toe(s) (acquired), right foot: Secondary | ICD-10-CM

## 2023-02-01 DIAGNOSIS — B351 Tinea unguium: Secondary | ICD-10-CM

## 2023-02-01 DIAGNOSIS — L84 Corns and callosities: Secondary | ICD-10-CM | POA: Diagnosis not present

## 2023-02-01 DIAGNOSIS — M79672 Pain in left foot: Secondary | ICD-10-CM

## 2023-02-01 DIAGNOSIS — E1151 Type 2 diabetes mellitus with diabetic peripheral angiopathy without gangrene: Secondary | ICD-10-CM

## 2023-02-01 DIAGNOSIS — M79671 Pain in right foot: Secondary | ICD-10-CM | POA: Diagnosis not present

## 2023-02-01 NOTE — Progress Notes (Signed)
       Subjective:  Patient ID: Victoria Mcconnell, female    DOB: 1946-09-08,  MRN: [id number]  Victoria Mcconnell presents to clinic today for:  Chief Complaint  Patient presents with   Nail Problem    Rm 23 RFC bilateral nail trim 1-5.    Callouses    Bilateral dry places on the lateral side of both feet.   . Patient notes nails are thick and elongated, causing pain in shoe gear when ambulating.  He notes a painful corn on the medial aspect of the right second toe.  She also has some calluses that covers the bottom of both heels.  She is interested in diabetic shoes.  She has never had them before.  Denies numbness in the feet.  She does check her blood sugar daily  PCP is Kerri Perches, MD.  No Known Allergies  Review of Systems: Negative except as noted in the HPI.  Objective:  There were no vitals filed for this visit.  Victoria Mcconnell is a pleasant 77 y.o. female in NAD. AAO x 3.  Vascular Examination: Patient has palpable DP pulse, absent PT pulse bilateral.  Delayed capillary refill bilateral toes.  Sparse digital hair bilateral.  Proximal to distal cooling WNL bilateral.    Dermatological Examination: Interspaces are clear with no open lesions noted bilateral.  Nails are 3-7mm thick, with yellowish/brown discoloration, subungual debris and distal onycholysis x10.  There is pain with compression of nails x10.  There are hyperkeratotic lesions noted on the medial aspect of the right second toe distal interphalangeal joint.  Neurological Examination: Protective sensation intact b/l LE. Vibratory sensation intact b/l LE.  Musculoskeletal Examination: Muscle strength 5/5 to all LE muscle groups b/l.  There is mild contracture of the right second toe at the level of the DIPJ.  Reducible by manipulation     Latest Ref Rng & Units 01/18/2023   10:32 AM 07/16/2022    8:41 AM  Hemoglobin A1C  Hemoglobin-A1c 4.8 - 5.6 % 6.1  6.1    Patient qualifies for at-risk foot care because  of diabetes with PVD (Q8).  Assessment/Plan: 1. Type 2 diabetes with peripheral circulatory disorder, controlled (HCC)   2.  Mild hammertoe deformity right second toe  Mycotic nails x10 were sharply debrided with sterile nail nippers and power debriding burr to decrease bulk and length.  Hyperkeratotic lesion was shaved with #312 blade.  Discussed diabetic shoes with the patient today.  She would be a good candidate for the diabetic shoe program due to her diabetes and mild hammertoe on the right second toe which has been causing the painful corn.  Reviewed diabetic footcare with the patient and good daily habits to get into.   Return in about 3 months (around 05/04/2023) for [facility]   Victoria Mcconnell, DPM, FACFAS [facility]     [address] [place], [place] [address]                Office [phone]  Fax [phone]

## 2023-02-10 ENCOUNTER — Ambulatory Visit: Payer: Self-pay | Admitting: *Deleted

## 2023-02-10 NOTE — Patient Outreach (Addendum)
  Care Coordination   Follow Up Visit Note   02/10/2023 Name: CITLALI RUS MRN: [id number] DOB: October 21, 1945  [name] is a 77 y.o. year old female who sees Lodema Hong, Milus Mallick, MD for primary care. I spoke with  [name] by phone today.  What matters to the patients health and wellness today?  Received injection for wrist by Dr Dallas Schimke that helped   Diabetes- cbg 103 but varies depending on her intake She discussed possible intake of mucinex as one cause of cbg increase  Dr Cathey Endow (in [place]) appointment is on March 23 2023 at 0830   congestive Heart Failure (CHF) Nephrologist seen in January 2024 and is seen annually  Voiced understanding of food label information for some of her preferred foods like Popcorn 1 serving intake-  Calories: 30 Protein: 1g Fat: 0.34g Carbohydrates: 6.2g Fiber: 1.2g Voiced understanding of 10-35% of total calories protein intake  Voiced understanding of Protein-rich vegetables include: Cruciferous-(Broccoli, Cauliflower, [address] Sprouts, Bok [place], Watercress) [place], Mustard Greens, Psychologist, educational) Perennial/Fibrous-(Asparagus) Herbs-(Alfalfa Sprouts)  Voiced understanding of options for drinks includes Zero drinks  For sweets includes Fiber one products For bread includes sara lee delightful 45   Voiced understanding of the Importance of carbohydrates for energy & not excluding it in her diet    Goals Addressed             This Visit's Progress    [facility] care coordination services   On track    Interventions Today    Flowsheet Row Most Recent Value  Chronic Disease   Chronic disease during today's visit Diabetes, Chronic Kidney Disease/End Stage Renal Disease (ESRD), Other  [wrist pain steriod injection (how often, effectiveness)]  General Interventions   General Interventions Discussed/Reviewed General Interventions Reviewed, Labs, Durable Medical Equipment (DME), Doctor Visits  Labs Kidney Function  Doctor  Visits Discussed/Reviewed Doctor Visits Reviewed, PCP, Specialist  Durable Medical Equipment (DME) Glucomoter  PCP/Specialist Visits Compliance with follow-up visit  Exercise Interventions   Exercise Discussed/Reviewed Exercise Reviewed, Physical Activity, Weight Managment  Physical Activity Discussed/Reviewed Physical Activity Reviewed, Home Exercise Program (HEP)  Weight Management Weight maintenance  Education Interventions   Education Provided Provided Education, Provided Web-based Education  [Education sent via my chart ion chronic kidney disease & food basics, protein diet , diabetes and kidney disease, reading food labels, carbohydrate, protein content in food, pre diabetes eating plan + videos]  Provided Verbal Education On Nutrition, Labs, Medication, Other  Labs Reviewed Kidney Function  Mental Health Interventions   Mental Health Discussed/Reviewed Mental Health Reviewed, Coping Strategies  Nutrition Interventions   Nutrition Discussed/Reviewed Nutrition Reviewed, Carbohydrate meal planning, Adding fruits and vegetables, Portion sizes, Decreasing sugar intake, Increasing proteins  Pharmacy Interventions   Pharmacy Dicussed/Reviewed Pharmacy Topics Reviewed, Medications and their functions  Safety Interventions   Safety Discussed/Reviewed Safety Reviewed, Home Safety              SDOH assessments and interventions completed:  No     Care Coordination Interventions:  Yes, provided   Follow up plan: Follow up call scheduled for 05/13/23    Encounter Outcome:  Pt. Visit Completed   Kree Armato L. Noelle Penner, RN, BSN, CCM [facility] Care Management Community Coordinator Office number [phone]

## 2023-02-10 NOTE — Patient Instructions (Addendum)
Visit Information  Thank you for taking time to visit with me today. Please don't hesitate to contact me if I can be of assistance to you.    Popcorn 1 serving intake-  Calories: 30 Protein: 1g Fat: 0.34g Carbohydrates: 6.2g Fiber: 1.2g Voiced understanding of 10-35% of total calories protein intake  Voiced understanding of Protein-rich vegetables include: Cruciferous-(Broccoli, Cauliflower, [address] Sprouts, Bok [place], Watercress) [place], Mustard Greens, Psychologist, educational) Perennial/Fibrous-(Asparagus) Herbs-(Alfalfa Sprouts)  Voiced understanding of options for drinks includes Zero drinks  For sweets includes Fiber one products For bread includes sara lee delightful 45    Following are the goals we discussed today:   Goals Addressed             This Visit's Progress    [facility] care coordination services   On track    Interventions Today    Flowsheet Row Most Recent Value  Chronic Disease   Chronic disease during today's visit Diabetes, Chronic Kidney Disease/End Stage Renal Disease (ESRD), Other  [wrist pain steriod injection (how often, effectiveness)]  General Interventions   General Interventions Discussed/Reviewed General Interventions Reviewed, Labs, Durable Medical Equipment (DME), Doctor Visits  Labs Kidney Function  Doctor Visits Discussed/Reviewed Doctor Visits Reviewed, PCP, Specialist  Durable Medical Equipment (DME) Glucomoter  PCP/Specialist Visits Compliance with follow-up visit  Exercise Interventions   Exercise Discussed/Reviewed Exercise Reviewed, Physical Activity, Weight Managment  Physical Activity Discussed/Reviewed Physical Activity Reviewed, Home Exercise Program (HEP)  Weight Management Weight maintenance  Education Interventions   Education Provided Provided Education, Provided Web-based Education  [Education sent via my chart ion chronic kidney disease & food basics, protein diet , diabetes and kidney disease, reading food labels,  carbohydrate, protein content in food, pre diabetes eating plan + videos]  Provided Verbal Education On Nutrition, Labs, Medication, Other  Labs Reviewed Kidney Function  Mental Health Interventions   Mental Health Discussed/Reviewed Mental Health Reviewed, Coping Strategies  Nutrition Interventions   Nutrition Discussed/Reviewed Nutrition Reviewed, Carbohydrate meal planning, Adding fruits and vegetables, Portion sizes, Decreasing sugar intake, Increasing proteins  Pharmacy Interventions   Pharmacy Dicussed/Reviewed Pharmacy Topics Reviewed, Medications and their functions  Safety Interventions   Safety Discussed/Reviewed Safety Reviewed, Home Safety              Our next appointment is by telephone on 05/13/23 at 3:30 pm  Please call the care guide team at [phone] if you need to cancel or reschedule your appointment.   If you are experiencing a Mental Health or Behavioral Health Crisis or need someone to talk to, please call the Suicide and Crisis Lifeline: 988 call the [place] National Suicide Prevention Lifeline: 308-443-4603 or TTY: (240)802-6925 TTY (760)600-8290) to talk to a trained counselor call 1-800-273-TALK (toll free, 24 hour hotline) call the [facility]: (573) 512-7370 call 911   Patient verbalizes understanding of instructions and care plan provided today and agrees to view in MyChart. Active MyChart status and patient understanding of how to access instructions and care plan via MyChart confirmed with patient.     The patient has been provided with contact information for the care management team and has been advised to call with any health related questions or concerns.   Tnia Anglada L. Noelle Penner, RN, BSN, CCM [facility] Care Management Community Coordinator Office number [phone]

## 2023-02-16 ENCOUNTER — Ambulatory Visit: Payer: Self-pay | Admitting: *Deleted

## 2023-02-16 NOTE — Patient Outreach (Signed)
  Care Coordination   Follow Up Visit Note   12/08/2023 updated noted for 02/16/23 Name: Victoria Mcconnell MRN: [id number] DOB: [birth year]  Victoria Mcconnell is a 77 y.o. year old female who sees Victoria Perches, MD for primary care. I spoke with  Victoria Mcconnell by phone today.  What matters to the patients health and wellness today?  Return call to patient to offer assistance with locating the web base education sent via mychart on 02/10/23    Goals Addressed             This Visit's Progress    Home management/improvement of Chronic Kidney Disease, hypertension, diabetes, obtain medication assistance- care coordination services       Patient will have patient assistance forms completed for [employer] Patient will continue to attend all medical appointments   Interventions Today    Flowsheet Row Most Recent Value  Chronic Disease   Chronic disease during today's visit Other  [assisted to find education sent in my chart]  General Interventions   General Interventions Discussed/Reviewed General Interventions Reviewed, [employer]  Education Interventions   Education Provided Provided Education  [assisted to find education sent in my chart]  Provided Verbal Education On Other             SDOH assessments and interventions completed:  No     Care Coordination Interventions:  Yes, provided   Follow up plan: Follow up call scheduled for 05/13/23    Encounter Outcome:  Pt. Visit Completed   Victoria Mcconnell L. Noelle Penner, RN, BSN, CCM [facility] Care Management Community Coordinator Office number [phone]

## 2023-02-22 ENCOUNTER — Ambulatory Visit (INDEPENDENT_AMBULATORY_CARE_PROVIDER_SITE_OTHER): Payer: Medicare Other | Admitting: Podiatry

## 2023-02-22 DIAGNOSIS — E1151 Type 2 diabetes mellitus with diabetic peripheral angiopathy without gangrene: Secondary | ICD-10-CM

## 2023-02-22 DIAGNOSIS — M2041 Other hammer toe(s) (acquired), right foot: Secondary | ICD-10-CM

## 2023-02-22 DIAGNOSIS — L84 Corns and callosities: Secondary | ICD-10-CM

## 2023-02-22 NOTE — Progress Notes (Signed)
Patient presents to the office today for diabetic shoe and insole measuring.  Patient was measured with brannock device to determine size and width for 1 pair of extra depth shoes and foam casted for 3 pair of insoles.   ABN signed.   Documentation of medical necessity will be sent to patient's treating diabetic doctor to verify and sign.   Patient's diabetic provider: Dr Lodema Hong, Julieanne Cotton and insoles will be ordered at that time and patient will be notified for an appointment for fitting when they arrive.   Brannock measurement: 11.5  Patient shoe selection-   1st   Shoe choice:   [id number] OrthoFeet [place]  2nd  Shoe choice:   843 OrthoFeet [place]  Shoe size ordered: 11.5

## 2023-03-17 DIAGNOSIS — E1159 Type 2 diabetes mellitus with other circulatory complications: Secondary | ICD-10-CM | POA: Diagnosis not present

## 2023-03-18 LAB — BMP8+EGFR
BUN/Creatinine Ratio: 10 — ABNORMAL LOW (ref 12–28)
BUN: 12 mg/dL (ref 8–27)
CO2: 22 mmol/L (ref 20–29)
Calcium: 10.2 mg/dL (ref 8.7–10.3)
Chloride: 106 mmol/L (ref 96–106)
Creatinine, Ser: 1.22 mg/dL — ABNORMAL HIGH (ref 0.57–1.00)
Glucose: 101 mg/dL — ABNORMAL HIGH (ref 70–99)
Potassium: 4 mmol/L (ref 3.5–5.2)
Sodium: 141 mmol/L (ref 134–144)
eGFR: 46 mL/min/{1.73_m2} — ABNORMAL LOW (ref >59)

## 2023-03-19 ENCOUNTER — Ambulatory Visit (INDEPENDENT_AMBULATORY_CARE_PROVIDER_SITE_OTHER): Payer: Medicare Other | Admitting: Podiatry

## 2023-03-19 DIAGNOSIS — M2041 Other hammer toe(s) (acquired), right foot: Secondary | ICD-10-CM | POA: Diagnosis not present

## 2023-03-19 DIAGNOSIS — E1151 Type 2 diabetes mellitus with diabetic peripheral angiopathy without gangrene: Secondary | ICD-10-CM | POA: Diagnosis not present

## 2023-03-19 DIAGNOSIS — L84 Corns and callosities: Secondary | ICD-10-CM | POA: Diagnosis not present

## 2023-03-19 DIAGNOSIS — M79672 Pain in left foot: Secondary | ICD-10-CM

## 2023-03-19 DIAGNOSIS — M79671 Pain in right foot: Secondary | ICD-10-CM

## 2023-03-19 NOTE — Progress Notes (Unsigned)
Patient presents today to pick up diabetic shoes and insoles.  Patient was dispensed 1 pair of diabetic shoes and 3 pairs of foam casted diabetic insoles. Fit was satisfactory. Instructions for break-in and wear was reviewed and a copy was given to the patient.   Re-appointment for regularly scheduled diabetic foot care visits or if they should experience any trouble with the shoes or insoles.  

## 2023-03-23 DIAGNOSIS — H2513 Age-related nuclear cataract, bilateral: Secondary | ICD-10-CM | POA: Diagnosis not present

## 2023-03-23 DIAGNOSIS — H401131 Primary open-angle glaucoma, bilateral, mild stage: Secondary | ICD-10-CM | POA: Diagnosis not present

## 2023-03-23 DIAGNOSIS — E119 Type 2 diabetes mellitus without complications: Secondary | ICD-10-CM | POA: Diagnosis not present

## 2023-03-23 LAB — HM DIABETES EYE EXAM

## 2023-03-24 ENCOUNTER — Encounter: Payer: Self-pay | Admitting: Family Medicine

## 2023-03-24 ENCOUNTER — Other Ambulatory Visit: Payer: Self-pay

## 2023-03-24 ENCOUNTER — Other Ambulatory Visit (HOSPITAL_COMMUNITY): Payer: Self-pay | Admitting: Family Medicine

## 2023-03-24 ENCOUNTER — Ambulatory Visit (INDEPENDENT_AMBULATORY_CARE_PROVIDER_SITE_OTHER): Payer: Medicare Other | Admitting: Family Medicine

## 2023-03-24 VITALS — BP 142/84 | HR 73 | Ht 68.0 in | Wt 209.0 lb

## 2023-03-24 DIAGNOSIS — F329 Major depressive disorder, single episode, unspecified: Secondary | ICD-10-CM | POA: Diagnosis not present

## 2023-03-24 DIAGNOSIS — I10 Essential (primary) hypertension: Secondary | ICD-10-CM

## 2023-03-24 DIAGNOSIS — E1159 Type 2 diabetes mellitus with other circulatory complications: Secondary | ICD-10-CM | POA: Diagnosis not present

## 2023-03-24 DIAGNOSIS — F411 Generalized anxiety disorder: Secondary | ICD-10-CM

## 2023-03-24 DIAGNOSIS — Z1231 Encounter for screening mammogram for malignant neoplasm of breast: Secondary | ICD-10-CM

## 2023-03-24 DIAGNOSIS — Z6831 Body mass index (BMI) 31.0-31.9, adult: Secondary | ICD-10-CM

## 2023-03-24 DIAGNOSIS — Z599 Problem related to housing and economic circumstances, unspecified: Secondary | ICD-10-CM

## 2023-03-24 DIAGNOSIS — Z0001 Encounter for general adult medical examination with abnormal findings: Secondary | ICD-10-CM | POA: Diagnosis not present

## 2023-03-24 DIAGNOSIS — E6609 Other obesity due to excess calories: Secondary | ICD-10-CM

## 2023-03-24 MED ORDER — GABAPENTIN 100 MG PO CAPS: ORAL_CAPSULE | ORAL | 3 refills | Status: DC

## 2023-03-24 MED ORDER — AMLODIPINE BESYLATE 10 MG PO TABS: 10.0000 mg | ORAL_TABLET | Freq: Every day | ORAL | 2 refills | Status: DC

## 2023-03-24 NOTE — Patient Instructions (Addendum)
F/U re evaluate blood pressure and blood sugar mid August, call if you need me sooner  Nurse please send for urine / labs from Nephrologist Dr Bufford Buttner in [place], labs done in 10/2022.  Please schedule mammogram due in December at checkout at [facility] higher dose amlodipine is 10 mg daily, pls collect at [facility] today and start taking tomorrow, stop amlodipine 5 mg tablets tomorrow, once you have the 10 mg talet  Reduce lexapro for depression and anxiety to half tablet daily  Stop montelukast and flonase fr allergies and see how you do, if allergy symptoms start then resume the tablet  HBA1C, fasting liid, cmp and EGFR 3 to 5 days before nexzt appt  Nurse pls send for eye exzam from Dr Cathey Endow yesterday to diocumnt retinopathy presence/ absence  Thanks for choosing [facility], we consider it a privelige to serve you.

## 2023-03-24 NOTE — Progress Notes (Unsigned)
Victoria Mcconnell     MRN: [id number]      DOB: 1945/10/25  Chief Complaint  Patient presents with   Annual Exam    CPE    HPI: Patient is in for annual physical exam. Uncontrolled hTN is addressed and medicartions are reviewed, adjutment made in antidepressaant , and advised to stop daily allergy medications Recent labs,  are reviewed. Immunization is reviewed , and  updated if needed.   PE: BP (!) 142/84   Pulse 73   Ht 5\' 8"  (1.727 m)   Wt 209 lb (94.8 kg)   SpO2 95%   BMI 31.78 kg/m   Pleasant  female, alert and oriented x 3, in no cardio-pulmonary distress. Afebrile. HEENT No facial trauma or asymetry. Sinuses non tender.  Extra occullar muscles intact.. External ears normal, . Neck: supple, no adenopathy,JVD or thyromegaly.No bruits.  Chest: Clear to ascultation bilaterally.No crackles or wheezes. Non tender to palpation  Breast: Normal mammogram, not examined, denies lumps   Cardiovascular system; Heart sounds normal,  S1 and  S2 ,no S3.  No murmur, or thrill. Apical beat not displaced Peripheral pulses normal.  Abdomen: Soft, non tender,.   .   Musculoskeletal exam: Full ROM of spine, hips , shoulders and knees. No deformity ,swelling or crepitus noted. No muscle wasting or atrophy.   Neurologic: Cranial nerves 2 to 12 intact. Power, tone ,sensation  normal throughout. No disturbance in gait. No tremor.  Skin: Intact, no ulceration, erythema , scaling or rash noted. Pigmentation normal throughout  Psych; Normal mood and affect. Judgement and concentration normal   Assessment & Plan:  Encounter for Medicare annual examination with abnormal findings Annual exam as documented. Counseling done  re healthy lifestyle involving commitment to 150 minutes exercise per week, heart healthy diet, and attaining healthy weight.The importance of adequate sleep also discussed. Regular seat belt use and home safety, is also discussed. Changes in  health habits are decided on by the patient with goals and time frames  set for achieving them. Immunization and cancer screening needs are specifically addressed at this visit.   GAD (generalized anxiety disorder) Reduce dose of lexapro and re eval in 6  weeks approx  Depression Reduce dose of lexapro and re evaluate in 4 to 6 weeks  Type 2 diabetes mellitus with vascular disease (HCC) Victoria Mcconnell is reminded of the importance of commitment to daily physical activity for 30 minutes or more, as able and the need to limit carbohydrate intake to 30 to 60 grams per meal to help with blood sugar control.   The need to take medication as prescribed, test blood sugar as directed, and to call between visits if there is a concern that blood sugar is uncontrolled is also discussed.   Victoria Mcconnell is reminded of the importance of daily foot exam, annual eye examination, and good blood sugar, blood pressure and cholesterol control.     Latest Ref Rng & Units 03/17/2023   11:06 AM 01/18/2023   10:32 AM 11/19/2022   10:45 AM 07/16/2022    8:41 AM 05/21/2022   10:43 AM  Diabetic Labs  HbA1c 4.8 - 5.6 %  6.1   6.1    Chol 100 - 199 mg/dL  829   562    HDL >13 mg/dL  71   80    Calc LDL 0 - 99 mg/dL  086   578    Triglycerides 0 - 149 mg/dL  469   83  Creatinine 0.57 - 1.00 mg/dL 1.61  0.96  0.45  4.09  1.13       03/24/2023    3:57 PM 03/24/2023    3:56 PM 03/24/2023    3:12 PM 01/22/2023   10:52 AM 01/20/2023    3:04 PM 12/18/2022   11:26 AM 12/09/2022    9:59 AM  BP/Weight  Systolic BP 142 142 132 120 124  137  Diastolic BP 84 82 77 75 74  75  Wt. (Lbs)   209 207 209 204 206.12  BMI   31.78 kg/m2 31.47 kg/m2 31.78 kg/m2 31.02 kg/m2 31.34 kg/m2      01/22/2023   10:40 AM 09/13/2019   10:30 AM  Foot/eye exam completion dates  Foot Form Completion Done Done    Increase  jardiance dose and re eval also pt assistance with meds  refer pharmacy    Financial difficulty Reports that jardiance costs  over $100 / month will refer top pharmacist for eval and assistance  Obesity  Patient re-educated about  the importance of commitment to a  minimum of 150 minutes of exercise per week as able.  The importance of healthy food choices with portion control discussed, as well as eating regularly and within a 12 hour window most days. The need to choose "clean , green" food 50 to 75% of the time is discussed, as well as to make water the primary drink and set a goal of 64 ounces water daily.       03/24/2023    3:12 PM 01/22/2023   10:52 AM 01/20/2023    3:04 PM  Weight /BMI  Weight 209 lb 207 lb 209 lb  Height 5\' 8"  (1.727 m) 5\' 8"  (1.727 m) 5\' 8"  (1.727 m)  BMI 31.78 kg/m2 31.47 kg/m2 31.78 kg/m2      Essential (primary) hypertension Uncontrolled, inc amlodipine to 10 mg daily and re eval in 4 to 6 weeks DASH diet and commitment to daily physical activity for a minimum of 30 minutes discussed and encouraged, as a part of hypertension management. The importance of attaining a healthy weight is also discussed.     03/24/2023    3:57 PM 03/24/2023    3:56 PM 03/24/2023    3:12 PM 01/22/2023   10:52 AM 01/20/2023    3:04 PM 12/18/2022   11:26 AM 12/09/2022    9:59 AM  BP/Weight  Systolic BP 142 142 132 120 124  137  Diastolic BP 84 82 77 75 74  75  Wt. (Lbs)   209 207 209 204 206.12  BMI   31.78 kg/m2 31.47 kg/m2 31.78 kg/m2 31.02 kg/m2 31.34 kg/m2

## 2023-03-25 ENCOUNTER — Encounter: Payer: Self-pay | Admitting: Family Medicine

## 2023-03-25 DIAGNOSIS — Z599 Problem related to housing and economic circumstances, unspecified: Secondary | ICD-10-CM | POA: Insufficient documentation

## 2023-03-25 DIAGNOSIS — Z0001 Encounter for general adult medical examination with abnormal findings: Secondary | ICD-10-CM | POA: Insufficient documentation

## 2023-03-25 NOTE — Assessment & Plan Note (Addendum)
Victoria Mcconnell is reminded of the importance of commitment to daily physical activity for 30 minutes or more, as able and the need to limit carbohydrate intake to 30 to 60 grams per meal to help with blood sugar control.   The need to take medication as prescribed, test blood sugar as directed, and to call between visits if there is a concern that blood sugar is uncontrolled is also discussed.   Victoria Mcconnell is reminded of the importance of daily foot exam, annual eye examination, and good blood sugar, blood pressure and cholesterol control.     Latest Ref Rng & Units 03/17/2023   11:06 AM 01/18/2023   10:32 AM 11/19/2022   10:45 AM 07/16/2022    8:41 AM 05/21/2022   10:43 AM  Diabetic Labs  HbA1c 4.8 - 5.6 %  6.1   6.1    Chol 100 - 199 mg/dL  308   657    HDL >84 mg/dL  71   80    Calc LDL 0 - 99 mg/dL  696   295    Triglycerides 0 - 149 mg/dL  284   83    Creatinine 0.57 - 1.00 mg/dL 1.32  4.40  1.02  7.25  1.13       03/24/2023    3:57 PM 03/24/2023    3:56 PM 03/24/2023    3:12 PM 01/22/2023   10:52 AM 01/20/2023    3:04 PM 12/18/2022   11:26 AM 12/09/2022    9:59 AM  BP/Weight  Systolic BP 142 142 132 120 124  137  Diastolic BP 84 82 77 75 74  75  Wt. (Lbs)   209 207 209 204 206.12  BMI   31.78 kg/m2 31.47 kg/m2 31.78 kg/m2 31.02 kg/m2 31.34 kg/m2      01/22/2023   10:40 AM 09/13/2019   10:30 AM  Foot/eye exam completion dates  Foot Form Completion Done Done    Increase  jardiance dose and re eval also pt assistance with meds  refer pharmacy

## 2023-03-25 NOTE — Assessment & Plan Note (Signed)
Uncontrolled, inc amlodipine to 10 mg daily and re eval in 4 to 6 weeks DASH diet and commitment to daily physical activity for a minimum of 30 minutes discussed and encouraged, as a part of hypertension management. The importance of attaining a healthy weight is also discussed.     03/24/2023    3:57 PM 03/24/2023    3:56 PM 03/24/2023    3:12 PM 01/22/2023   10:52 AM 01/20/2023    3:04 PM 12/18/2022   11:26 AM 12/09/2022    9:59 AM  BP/Weight  Systolic BP 142 142 132 120 124  137  Diastolic BP 84 82 77 75 74  75  Wt. (Lbs)   209 207 209 204 206.12  BMI   31.78 kg/m2 31.47 kg/m2 31.78 kg/m2 31.02 kg/m2 31.34 kg/m2

## 2023-03-25 NOTE — Assessment & Plan Note (Signed)
Reports that jardiance costs over $100 / month will refer top pharmacist for eval and assistance

## 2023-03-25 NOTE — Assessment & Plan Note (Signed)
Reduce dose of lexapro and re eval in 6  weeks approx

## 2023-03-25 NOTE — Assessment & Plan Note (Signed)
  Patient re-educated about  the importance of commitment to a  minimum of 150 minutes of exercise per week as able.  The importance of healthy food choices with portion control discussed, as well as eating regularly and within a 12 hour window most days. The need to choose "clean , green" food 50 to 75% of the time is discussed, as well as to make water the primary drink and set a goal of 64 ounces water daily.       03/24/2023    3:12 PM 01/22/2023   10:52 AM 01/20/2023    3:04 PM  Weight /BMI  Weight 209 lb 207 lb 209 lb  Height 5\' 8"  (1.727 m) 5\' 8"  (1.727 m) 5\' 8"  (1.727 m)  BMI 31.78 kg/m2 31.47 kg/m2 31.78 kg/m2

## 2023-03-25 NOTE — Assessment & Plan Note (Signed)

## 2023-03-25 NOTE — Assessment & Plan Note (Signed)
Reduce dose of lexapro and re evaluate in 4 to 6 weeks

## 2023-03-27 ENCOUNTER — Other Ambulatory Visit: Payer: Self-pay | Admitting: Family Medicine

## 2023-04-14 ENCOUNTER — Other Ambulatory Visit: Payer: Self-pay | Admitting: Family Medicine

## 2023-04-14 ENCOUNTER — Other Ambulatory Visit: Payer: Medicare Other | Admitting: Pharmacist

## 2023-04-14 MED ORDER — DAPAGLIFLOZIN PROPANEDIOL 5 MG PO TABS: 5.0000 mg | ORAL_TABLET | Freq: Every day | ORAL | 5 refills | Status: DC

## 2023-04-14 NOTE — Progress Notes (Signed)
Pharmacist has been able to get farxiga at affordable cost over jardiance , 5 mg dose is sent in, and jardiance discontinued

## 2023-04-14 NOTE — Progress Notes (Signed)
04/14/2023 Name: Victoria Mcconnell MRN: [id number] DOB: 04-May-1946  Chief Complaint  Patient presents with   Diabetes   Victoria Mcconnell is a 77 y.o. year old female who presented for a telephone visit.   They were referred to the pharmacist by their PCP for assistance in managing diabetes.   Subjective:  Care Team: Primary Care Provider: Kerri Perches, MD ; Next Scheduled Visit: 04/2023 Medication Access/Adherence  Current Pharmacy:  [facility] [phone] - [place], [place] - [address] AT [facility] [address] [place] [address] Phone: [phone] Fax: [phone]  Patient reports affordability concerns with their medications: Yes  Patient reports access/transportation concerns to their pharmacy: No  Patient reports adherence concerns with their medications:  No     Diabetes:  Current medications:  Jardiance  Medications tried in the past: metformin   Current glucose readings: FBG 115, usually <130 Using one touch ultra meter  Current physical activity: exercise 4 days week; [employer]  Current medication access support: none  Objective:  Lab Results  Component Value Date   HGBA1C 6.1 (H) 01/18/2023     Medications Reviewed Today     Reviewed by Victoria Mcconnell, [facility] (Pharmacist) on 04/30/23 at 1249  Med List Status: <None>   Medication Order Taking? Sig Documenting Provider Last Dose Status Informant  acetaminophen (TYLENOL) 500 MG tablet [id number] No Take 500 mg by mouth 2 (two) times daily as needed. [provider] Taking Active Self  amLODipine (NORVASC) 10 MG tablet [id number] No Take 1 tablet (10 mg total) by mouth daily. Kerri Perches, MD Taking Active   atorvastatin (LIPITOR) 40 MG tablet [id number] No Take 1 tablet (40 mg total) by mouth daily. Kerri Perches, MD Taking Active   brimonidine [facility]) 0.2 % ophthalmic solution [id number] No Place 1 drop into both eyes in the morning and at bedtime. [provider]  Taking Active   Cholecalciferol (VITAMIN D) 50 MCG (2000 UT) tablet [id number] No Take 1 tablet (2,000 Units total) by mouth daily. Kerri Perches, MD Taking Active   clotrimazole-betamethasone [facility] Butts) cream [id number]  Apply twice daily to affected are(s) for 10 days , then as needed Kerri Perches, MD  Active   cyanocobalamin (VITAMIN B12) 500 MCG tablet [id number] No Take 1 tablet (500 mcg total) by mouth daily. Rojelio Brenner M, PA-C Taking Active   dorzolamide-timolol (COSOPT) 22.3-6.8 MG/ML ophthalmic solution [id number] No Place 1 drop into both eyes 2 times daily. [provider] Taking Active   escitalopram (LEXAPRO) 20 MG tablet [id number] No Take half tablet by  mouth every day Kerri Perches, MD Taking Active   fluticasone [facility]) 50 MCG/ACT nasal spray [id number] No Place 1 spray into both nostrils daily. Kerri Perches, MD Taking Active   glucose blood [facility] ULTRA) test strip [id number] No USE AS DIRECTED TO MONITOR FASTING BLOOD SUGAR ONCE DAILY Kerri Perches, MD Taking Active   JARDIANCE 25 MG TABS tablet [id number] No Take 25 mg by mouth daily. [provider] Taking Active   Lancets MISC [id number] No Please dispense based on patient and insurance preference. Use as directed to monitor FSBS 1x daily. Dx: E11.9. Salley Scarlet, MD Taking Active   LINZESS 72 MCG capsule [id number] No TAKE 1 CAPSULE BY MOUTH  DAILY BEFORE BREAKFAST Kerri Perches, MD Taking Active   montelukast (SINGULAIR) 10 MG tablet [id number] No Take one tablet at bedtimeas needed, try to wean off Kerri Perches, MD Taking  Active   valsartan-hydrochlorothiazide (DIOVAN-HCT) 160-25 MG tablet [id number] No TAKE 1 TABLET BY MOUTH DAILY Kerri Perches, MD Taking Active             Assessment/Plan:   Diabetes: - Currently controlled - Reviewed long term cardiovascular and renal outcomes of uncontrolled blood sugar - Reviewed goal A1c, goal  fasting, and goal 2 hour post prandial glucose - Recommend to transition from [place] 25mg  daily to [place] 10mg  daily due to ease of patient assistance apply for Farxiga 10mg  daily medication assistance program via [place] patient [place]   - Recommend to check glucose daily (fasting) or if symptomatic - Meets financial criteria for Farxiga patient assistance program through [place] PAP. Will collaborate with provider, CPhT, and patient to pursue assistance.     Follow Up Plan: 4 weeks w/ pharmD   Kieth Brightly, PharmD, BCACP Clinical Pharmacist, [facility]

## 2023-04-23 ENCOUNTER — Other Ambulatory Visit: Payer: Self-pay

## 2023-04-23 DIAGNOSIS — I10 Essential (primary) hypertension: Secondary | ICD-10-CM

## 2023-04-23 DIAGNOSIS — E785 Hyperlipidemia, unspecified: Secondary | ICD-10-CM | POA: Diagnosis not present

## 2023-04-23 DIAGNOSIS — E1159 Type 2 diabetes mellitus with other circulatory complications: Secondary | ICD-10-CM

## 2023-04-24 LAB — CMP14+EGFR
ALT: 12 [IU]/L (ref 0–32)
AST: 19 [IU]/L (ref 0–40)
Albumin: 4.6 g/dL (ref 3.8–4.8)
Alkaline Phosphatase: 86 [IU]/L (ref 44–121)
BUN/Creatinine Ratio: 10 — ABNORMAL LOW (ref 12–28)
BUN: 12 mg/dL (ref 8–27)
Bilirubin Total: 0.4 mg/dL (ref 0.0–1.2)
CO2: 21 mmol/L (ref 20–29)
Calcium: 10.7 mg/dL — ABNORMAL HIGH (ref 8.7–10.3)
Chloride: 105 mmol/L (ref 96–106)
Creatinine, Ser: 1.24 mg/dL — ABNORMAL HIGH (ref 0.57–1.00)
Globulin, Total: 2.7 g/dL (ref 1.5–4.5)
Glucose: 96 mg/dL (ref 70–99)
Potassium: 3.8 mmol/L (ref 3.5–5.2)
Sodium: 141 mmol/L (ref 134–144)
Total Protein: 7.3 g/dL (ref 6.0–8.5)
eGFR: 45 mL/min/{1.73_m2} — ABNORMAL LOW

## 2023-04-24 LAB — HEMOGLOBIN A1C
Est. average glucose Bld gHb Est-mCnc: 128 mg/dL
Hgb A1c MFr Bld: 6.1 % — ABNORMAL HIGH (ref 4.8–5.6)

## 2023-04-29 ENCOUNTER — Ambulatory Visit (INDEPENDENT_AMBULATORY_CARE_PROVIDER_SITE_OTHER): Payer: Medicare Other | Admitting: Family Medicine

## 2023-04-29 ENCOUNTER — Encounter: Payer: Self-pay | Admitting: Family Medicine

## 2023-04-29 VITALS — BP 128/72 | HR 77 | Ht 68.0 in | Wt 204.1 lb

## 2023-04-29 DIAGNOSIS — E559 Vitamin D deficiency, unspecified: Secondary | ICD-10-CM | POA: Diagnosis not present

## 2023-04-29 DIAGNOSIS — F329 Major depressive disorder, single episode, unspecified: Secondary | ICD-10-CM

## 2023-04-29 DIAGNOSIS — B359 Dermatophytosis, unspecified: Secondary | ICD-10-CM | POA: Diagnosis not present

## 2023-04-29 DIAGNOSIS — I10 Essential (primary) hypertension: Secondary | ICD-10-CM | POA: Diagnosis not present

## 2023-04-29 DIAGNOSIS — E785 Hyperlipidemia, unspecified: Secondary | ICD-10-CM | POA: Diagnosis not present

## 2023-04-29 DIAGNOSIS — F411 Generalized anxiety disorder: Secondary | ICD-10-CM

## 2023-04-29 DIAGNOSIS — E1159 Type 2 diabetes mellitus with other circulatory complications: Secondary | ICD-10-CM | POA: Diagnosis not present

## 2023-04-29 MED ORDER — CLOTRIMAZOLE-BETAMETHASONE 1-0.05 % EX CREA: TOPICAL_CREAM | CUTANEOUS | 1 refills | Status: DC

## 2023-04-29 NOTE — Assessment & Plan Note (Signed)
Controlled, no change in medication  

## 2023-04-29 NOTE — Assessment & Plan Note (Signed)
Clotrimazole/ betameth prescribed and pt educated

## 2023-04-29 NOTE — Patient Instructions (Addendum)
F/U first week in January, call if you need me sooner  Blood pressure is at goal  Stay on same medications, no more gabapentin since pain is not an issue  It is important that you exercise regularly at least 30 minutes 5 times a week. If you develop chest pain, have severe difficulty breathing, or feel very tired, stop exercising immediately and seek medical attention     Pls put in urine aCR done 10/2022  Fasting CBC, lipid, cmp and eGFr, HBA1C , TSH and vit D  Thanks for choosing [facility], we consider it a privelige to serve you.

## 2023-04-29 NOTE — Assessment & Plan Note (Signed)
Controlled, no change in medication DASH diet and commitment to daily physical activity for a minimum of 30 minutes discussed and encouraged, as a part of hypertension management. The importance of attaining a healthy weight is also discussed.     04/29/2023    1:53 PM 04/29/2023    1:18 PM 03/24/2023    3:57 PM 03/24/2023    3:56 PM 03/24/2023    3:12 PM 01/22/2023   10:52 AM 01/20/2023    3:04 PM  BP/Weight  Systolic BP 128 128 142 142 132 120 124  Diastolic BP 72 71 84 82 77 75 74  Wt. (Lbs)  204.12   209 207 209  BMI  31.04 kg/m2   31.78 kg/m2 31.47 kg/m2 31.78 kg/m2

## 2023-04-29 NOTE — Assessment & Plan Note (Signed)
Victoria Mcconnell is reminded of the importance of commitment to daily physical activity for 30 minutes or more, as able and the need to limit carbohydrate intake to 30 to 60 grams per meal to help with blood sugar control.   The need to take medication as prescribed, test blood sugar as directed, and to call between visits if there is a concern that blood sugar is uncontrolled is also discussed.   Victoria Mcconnell is reminded of the importance of daily foot exam, annual eye examination, and good blood sugar, blood pressure and cholesterol control.     Latest Ref Rng & Units 04/23/2023   11:04 AM 03/17/2023   11:06 AM 01/18/2023   10:32 AM 11/19/2022   10:45 AM 10/21/2022    8:00 AM  Diabetic Labs  HbA1c 4.8 - 5.6 % 6.1   6.1     Microalbumin      0.2      Chol 100 - 199 mg/dL 130   865     HDL >78 mg/dL 83   71     Calc LDL 0 - 99 mg/dL 70   469     Triglycerides 0 - 149 mg/dL 86   629     Creatinine 0.57 - 1.00 mg/dL 5.28  4.13  2.44  0.10       This result is from an external source.      04/29/2023    1:53 PM 04/29/2023    1:18 PM 03/24/2023    3:57 PM 03/24/2023    3:56 PM 03/24/2023    3:12 PM 01/22/2023   10:52 AM 01/20/2023    3:04 PM  BP/Weight  Systolic BP 128 128 142 142 132 120 124  Diastolic BP 72 71 84 82 77 75 74  Wt. (Lbs)  204.12   209 207 209  BMI  31.04 kg/m2   31.78 kg/m2 31.47 kg/m2 31.78 kg/m2      Latest Ref Rng & Units 03/23/2023   12:00 AM 01/22/2023   10:40 AM  Foot/eye exam completion dates  Eye Exam No Retinopathy No Retinopathy       Foot Form Completion   Done     This result is from an external source.

## 2023-04-29 NOTE — Progress Notes (Signed)
Victoria Mcconnell     MRN: [id number]      DOB: 07-28-1946  Chief Complaint  Patient presents with   Follow-up    Bp follow up    HPI Victoria Mcconnell is here for follow up and re-evaluation of chronic medical conditions, medication management and review of any available recent lab and radiology data.  Preventive health is updated, specifically  Cancer screening and Immunization.   Questions or concerns regarding consultations or procedures which the PT has had in the interim are  addressed. The PT denies any adverse reactions to current medications since the last visit.  C/o itchy rash on upper right breast, no redness or warmth or drainage x 2 weeks, recurrs periodically States she has stopped gaapentin, not helping her pain and does not need it ROS Denies recent fever or chills. Denies sinus pressure, nasal congestion, ear pain or sore throat. Denies chest congestion, productive cough or wheezing. Denies chest pains, palpitations and leg swelling Denies abdominal pain, nausea, vomiting,diarrhea or constipation.   Denies dysuria, frequency, hesitancy or incontinence. Denies uncontrolled  joint pain, swelling and limitation in mobility. Denies headaches, seizures, numbness, or tingling. Denies depression, anxiety or insomnia. PE  BP 128/72   Pulse 77   Ht 5\' 8"  (1.727 m)   Wt 204 lb 1.9 oz (92.6 kg)   SpO2 98%   BMI 31.04 kg/m   Patient alert and oriented and in no cardiopulmonary distress.  HEENT: No facial asymmetry, EOMI,     Neck supple .  Chest: Clear to auscultation bilaterally.  CVS: S1, S2 no murmurs, no S3.Regular rate.  ABD: Soft non tender.   Ext: No edema  MS: Adequate ROM spine, shoulders, hips and knees.  Skin: Intact, no ulcerations or rash noted.  Psych: Good eye contact, normal affect. Memory intact not anxious or depressed appearing.  CNS: CN 2-12 intact, power,  normal throughout.no focal deficits noted.   Assessment & Plan  Essential (primary)  hypertension Controlled, no change in medication DASH diet and commitment to daily physical activity for a minimum of 30 minutes discussed and encouraged, as a part of hypertension management. The importance of attaining a healthy weight is also discussed.     04/29/2023    1:53 PM 04/29/2023    1:18 PM 03/24/2023    3:57 PM 03/24/2023    3:56 PM 03/24/2023    3:12 PM 01/22/2023   10:52 AM 01/20/2023    3:04 PM  BP/Weight  Systolic BP 128 128 142 142 132 120 124  Diastolic BP 72 71 84 82 77 75 74  Wt. (Lbs)  204.12   209 207 209  BMI  31.04 kg/m2   31.78 kg/m2 31.47 kg/m2 31.78 kg/m2       GAD (generalized anxiety disorder) Controlled, no change in medication   Depression Controlled, no change in medication   Type 2 diabetes mellitus with vascular disease (HCC) Victoria Mcconnell is reminded of the importance of commitment to daily physical activity for 30 minutes or more, as able and the need to limit carbohydrate intake to 30 to 60 grams per meal to help with blood sugar control.   The need to take medication as prescribed, test blood sugar as directed, and to call between visits if there is a concern that blood sugar is uncontrolled is also discussed.   Victoria Mcconnell is reminded of the importance of daily foot exam, annual eye examination, and good blood sugar, blood pressure and cholesterol control.  Latest Ref Rng & Units 04/23/2023   11:04 AM 03/17/2023   11:06 AM 01/18/2023   10:32 AM 11/19/2022   10:45 AM 10/21/2022    8:00 AM  Diabetic Labs  HbA1c 4.8 - 5.6 % 6.1   6.1     Microalbumin      0.2      Chol 100 - 199 mg/dL 956   213     HDL >08 mg/dL 83   71     Calc LDL 0 - 99 mg/dL 70   657     Triglycerides 0 - 149 mg/dL 86   846     Creatinine 0.57 - 1.00 mg/dL 9.62  9.52  8.41  3.24       This result is from an external source.      04/29/2023    1:53 PM 04/29/2023    1:18 PM 03/24/2023    3:57 PM 03/24/2023    3:56 PM 03/24/2023    3:12 PM 01/22/2023   10:52 AM 01/20/2023     3:04 PM  BP/Weight  Systolic BP 128 128 142 142 132 120 124  Diastolic BP 72 71 84 82 77 75 74  Wt. (Lbs)  204.12   209 207 209  BMI  31.04 kg/m2   31.78 kg/m2 31.47 kg/m2 31.78 kg/m2      Latest Ref Rng & Units 03/23/2023   12:00 AM 01/22/2023   10:40 AM  Foot/eye exam completion dates  Eye Exam No Retinopathy No Retinopathy       Foot Form Completion   Done     This result is from an external source.        Tinea Clotrimazole/ betameth prescribed and pt educated

## 2023-04-30 ENCOUNTER — Telehealth: Payer: Self-pay | Admitting: Pharmacist

## 2023-04-30 NOTE — Telephone Encounter (Signed)
New application for Farxiga 10mg  daily via [place] patient assistance program Send patient portion to home Can you send PCP portion to PCP office--Dr. [facility] Switching from [facility] to [place] (pt does not meet the [place] program eligibility requirements)

## 2023-05-03 NOTE — Telephone Encounter (Signed)
MAILING AZ&ME APPLICATION TO PATIENTS HOME

## 2023-05-10 ENCOUNTER — Ambulatory Visit: Payer: Medicare Other | Admitting: Podiatry

## 2023-05-10 DIAGNOSIS — B351 Tinea unguium: Secondary | ICD-10-CM

## 2023-05-10 DIAGNOSIS — M79674 Pain in right toe(s): Secondary | ICD-10-CM

## 2023-05-10 DIAGNOSIS — M79675 Pain in left toe(s): Secondary | ICD-10-CM

## 2023-05-10 NOTE — Progress Notes (Signed)
       Subjective:  Patient ID: Victoria Mcconnell, female    DOB: 05/31/1946,  MRN: [id number]   Victoria Mcconnell presents to clinic today for:  Chief Complaint  Patient presents with   Diabetes    [facility] BS - 100 A1C -6.1   . Patient notes nails are thick, discolored, elongated and painful in shoegear when trying to ambulate.  Patient got her diabetic shoes and is pleased.  PCP is Kerri Perches, MD.  Past Medical History:  Diagnosis Date   Allergy    seasonal allergies   Anxiety    Arthritis    Depression, major, single episode, moderate (HCC) 07/01/2015   Diabetes mellitus    Glaucoma    Hyperlipidemia    Hypertension    Osteopenia     No Known Allergies  Review of Systems: Negative except as noted in the HPI.  Objective:  There were no vitals filed for this visit.  Victoria Mcconnell is a pleasant 77 y.o. female in NAD. AAO x 3.  Vascular Examination: Capillary refill time is 3-5 seconds to toes bilateral. Palpable pedal pulses b/l LE. Digital hair present b/l.  Skin temperature gradient WNL b/l. No varicosities b/l. No cyanosis noted b/l.   Dermatological Examination: Pedal skin with normal turgor, texture and tone b/l. No open wounds. No interdigital macerations b/l. Toenails x10 are 3mm thick, discolored, dystrophic with subungual debris. There is pain with compression of the nail plates.  They are elongated x10      Latest Ref Rng & Units 04/23/2023   11:04 AM 01/18/2023   10:32 AM 07/16/2022    8:41 AM  Hemoglobin A1C  Hemoglobin-A1c 4.8 - 5.6 % 6.1  6.1  6.1     Assessment/Plan: 1. Pain due to onychomycosis of toenails of both feet    The mycotic toenails were sharply debrided x10 with sterile nail nippers and a power debriding burr to decrease bulk/thickness and length.    Return in about 3 months (around 08/10/2023) for [facility].   Clerance Lav, DPM, FACFAS [facility]     [address] [place], [place] [address]                Office [phone]  Fax [phone]

## 2023-05-13 ENCOUNTER — Ambulatory Visit: Payer: Self-pay | Admitting: *Deleted

## 2023-05-13 NOTE — Patient Outreach (Signed)
  Care Coordination   Follow Up Visit Note   05/13/2023 Name: Victoria Mcconnell MRN: [id number] DOB: 03/22/1946  Victoria Mcconnell is a 77 y.o. year old female who sees Lodema Hong, Milus Mallick, MD for primary care. I spoke with  Victoria Mcconnell by phone today.  What matters to the patients health and wellness today?  Medicine assistance Will continue to use Jardiance Had a patient assistance form sent to her by office pharmacist but will not fill out after discussed with Dr Lodema Hong  Diabetes- managed A1c 6.1 Has diabetic shoes   Tinea of foot Saw podiatrist on 8.26.24 Will continue treatment as ordered Press on  Chronic Kidney disease (CKD)/Hypertension - BP value 110/61 inquired about how to manage CKD Voiced understanding of the need to keep her blood pressure (BP) & cbg (capillary blood glucose) under control to keep CKD  Asked various questions about CKD, proteins, how to lower BP, how to eat a well balanced meal to manage her chronic illnesses. Voiced understanding of the answers   Goals Addressed             This Visit's Progress    Home management/improvement of hypertension, diabetes, obtain medicinesTHN care coordination services       Interventions Today    Flowsheet Row Most Recent Value  Chronic Disease   Chronic disease during today's visit Diabetes, Chronic Kidney Disease/End Stage Renal Disease (ESRD), Hypertension (HTN)  General Interventions   General Interventions Discussed/Reviewed General Interventions Reviewed, Labs, Annual Foot Exam, Durable Medical Equipment (DME), Doctor Visits  Labs Kidney Function, Hgb A1c every 3 months  Doctor Visits Discussed/Reviewed Doctor Visits Reviewed, PCP, Specialist  Durable Medical Equipment (DME) BP Cuff, Glucomoter  PCP/Specialist Visits Compliance with follow-up visit  Exercise Interventions   Exercise Discussed/Reviewed Exercise Reviewed, Physical Activity, Weight Managment  Physical Activity Discussed/Reviewed Physical Activity  Reviewed  Weight Management Weight maintenance  Education Interventions   Education Provided --  [white coat syndrome (increased office BP), Tinea, nail care (press on), hypertension/nutrition, how to keep renal function maintained, NSAIDs, well balanced meal, myplate,]  Provided Verbal Education On --  [Education sent on protein content in foods, high protein & high calorie diet so she can know what to avoid, MyPlate to understand a balance meal, food basics for CKD, managing hypertension, Basic metabolic panel  Answered various questions.]  Labs Reviewed Hgb A1c, Kidney Function, Lipid Profile  Mental Health Interventions   Mental Health Discussed/Reviewed Mental Health Reviewed, Coping Strategies  Nutrition Interventions   Nutrition Discussed/Reviewed Nutrition Reviewed, Adding fruits and vegetables, Fluid intake, Portion sizes, Decreasing sugar intake, Decreasing fats, Decreasing salt  Pharmacy Interventions   Pharmacy Dicussed/Reviewed Pharmacy Topics Reviewed, Affording Medications, Medications and their functions  Safety Interventions   Safety Discussed/Reviewed Safety Reviewed              SDOH assessments and interventions completed:  No     Care Coordination Interventions:  Yes, provided   Follow up plan: Follow up call scheduled for 08/13/23    Encounter Outcome:  Pt. Visit Completed   Lydiah Pong L. Noelle Penner, RN, BSN, CCM, Care Management Coordinator [phone]

## 2023-05-13 NOTE — Patient Instructions (Addendum)
Visit Information  Thank you for taking time to visit with me today. Please don't hesitate to contact me if I can be of assistance to you.   Following are the goals we discussed today:   Patient goals Patient will eat a well balance meals to include on each plate a protein (meat+, like peanut butter, chicken, [place], beans, eggs, tofu, fish- air fried, baked, boiled, broiled), 2-3 vegetables/fruits, carbohydrate (whole grain foods, beans, low fat diary (milk, cheese, yogurt give energy))   Patient will help manage chronic kidney disease by: Lowering high blood pressure. Managing blood sugar levels. Reducing salt intake.   Get an annual flu shot. Moderate protein consumption. Avoid NSAIDs, a type of painkiller. NSAIDs are ibuprofen, naproxen, diclofenac,       celecoxib, mefenamic acid, etoricoxib, indomethacin, Aspirin higher than 81 mg You can take Tylenol  Patient will review web based education provided today    Goals Addressed             This Visit's Progress    Home management/improvement of hypertension, diabetes, obtain medicinesTHN care coordination services       Interventions Today    Flowsheet Row Most Recent Value  Chronic Disease   Chronic disease during today's visit Diabetes, Chronic Kidney Disease/End Stage Renal Disease (ESRD), Hypertension (HTN)  General Interventions   General Interventions Discussed/Reviewed General Interventions Reviewed, Labs, Annual Foot Exam, Durable Medical Equipment (DME), Doctor Visits  Labs Kidney Function, Hgb A1c every 3 months  Doctor Visits Discussed/Reviewed Doctor Visits Reviewed, PCP, Specialist  Durable Medical Equipment (DME) BP Cuff, Glucomoter  PCP/Specialist Visits Compliance with follow-up visit  Exercise Interventions   Exercise Discussed/Reviewed Exercise Reviewed, Physical Activity, Weight Managment  Physical Activity Discussed/Reviewed Physical Activity Reviewed  Weight Management Weight maintenance  Education  Interventions   Education Provided --  [white coat syndrome (increased office BP), Tinea, nail care (press on), hypertension/nutrition, how to keep renal function maintained, NSAIDs, well balanced meal, myplate,]  Provided Verbal Education On --  [Education sent on protein content in foods, high protein & high calorie diet so she can know what to avoid, MyPlate to understand a balance meal, food basics for CKD, managing hypertension, Basic metabolic panel  Answered various questions.]  Labs Reviewed Hgb A1c, Kidney Function, Lipid Profile  Mental Health Interventions   Mental Health Discussed/Reviewed Mental Health Reviewed, Coping Strategies  Nutrition Interventions   Nutrition Discussed/Reviewed Nutrition Reviewed, Adding fruits and vegetables, Fluid intake, Portion sizes, Decreasing sugar intake, Decreasing fats, Decreasing salt  Pharmacy Interventions   Pharmacy Dicussed/Reviewed Pharmacy Topics Reviewed, Affording Medications, Medications and their functions  Safety Interventions   Safety Discussed/Reviewed Safety Reviewed              Our next appointment is by telephone on 08/13/23 at 3:15 pm  Please call the care guide team at [phone] if you need to cancel or reschedule your appointment.   If you are experiencing a Mental Health or Behavioral Health Crisis or need someone to talk to, please call the Suicide and Crisis Lifeline: 988 call the [place] National Suicide Prevention Lifeline: 978-344-6070 or TTY: (302) 171-6839 TTY 509-392-1501) to talk to a trained counselor call 1-800-273-TALK (toll free, 24 hour hotline) call the [facility]: [phone] call 911   Patient verbalizes understanding of instructions and care plan provided today and agrees to view in MyChart. Active MyChart status and patient understanding of how to access instructions and care plan via MyChart confirmed with patient.     The  patient has been provided with contact information  for the care management team and has been advised to call with any health related questions or concerns.   Rane Blitch L. Noelle Penner, RN, BSN, CCM, Care Management Coordinator [phone]

## 2023-05-21 ENCOUNTER — Inpatient Hospital Stay: Payer: Medicare Other | Attending: Hematology

## 2023-05-21 DIAGNOSIS — D649 Anemia, unspecified: Secondary | ICD-10-CM | POA: Diagnosis not present

## 2023-05-21 DIAGNOSIS — D631 Anemia in chronic kidney disease: Secondary | ICD-10-CM

## 2023-05-21 DIAGNOSIS — Z807 Family history of other malignant neoplasms of lymphoid, hematopoietic and related tissues: Secondary | ICD-10-CM | POA: Diagnosis not present

## 2023-05-21 DIAGNOSIS — I129 Hypertensive chronic kidney disease with stage 1 through stage 4 chronic kidney disease, or unspecified chronic kidney disease: Secondary | ICD-10-CM | POA: Diagnosis not present

## 2023-05-21 DIAGNOSIS — Z87891 Personal history of nicotine dependence: Secondary | ICD-10-CM | POA: Diagnosis not present

## 2023-05-21 DIAGNOSIS — E1122 Type 2 diabetes mellitus with diabetic chronic kidney disease: Secondary | ICD-10-CM | POA: Insufficient documentation

## 2023-05-21 DIAGNOSIS — N1831 Chronic kidney disease, stage 3a: Secondary | ICD-10-CM | POA: Insufficient documentation

## 2023-05-21 DIAGNOSIS — D472 Monoclonal gammopathy: Secondary | ICD-10-CM | POA: Diagnosis not present

## 2023-05-21 DIAGNOSIS — E538 Deficiency of other specified B group vitamins: Secondary | ICD-10-CM

## 2023-05-21 LAB — COMPREHENSIVE METABOLIC PANEL
ALT: 15 U/L (ref 0–44)
AST: 17 U/L (ref 15–41)
Albumin: 4.1 g/dL (ref 3.5–5.0)
Alkaline Phosphatase: 76 U/L (ref 38–126)
Anion gap: 8 (ref 5–15)
BUN: 13 mg/dL (ref 8–23)
CO2: 25 mmol/L (ref 22–32)
Calcium: 10.1 mg/dL (ref 8.9–10.3)
Chloride: 104 mmol/L (ref 98–111)
Creatinine, Ser: 1.15 mg/dL — ABNORMAL HIGH (ref 0.44–1.00)
GFR, Estimated: 50 mL/min — ABNORMAL LOW (ref >60)
Glucose, Bld: 107 mg/dL — ABNORMAL HIGH (ref 70–99)
Potassium: 3.3 mmol/L — ABNORMAL LOW (ref 3.5–5.1)
Sodium: 137 mmol/L (ref 135–145)
Total Bilirubin: 0.9 mg/dL (ref 0.3–1.2)
Total Protein: 8 g/dL (ref 6.5–8.1)

## 2023-05-21 LAB — CBC WITH DIFFERENTIAL/PLATELET
Abs Immature Granulocytes: 0.02 10*3/uL (ref 0.00–0.07)
Basophils Absolute: 0.1 10*3/uL (ref 0.0–0.1)
Basophils Relative: 1 %
Eosinophils Absolute: 0.2 10*3/uL (ref 0.0–0.5)
Eosinophils Relative: 2 %
HCT: 38.2 % (ref 36.0–46.0)
Hemoglobin: 12.7 g/dL (ref 12.0–15.0)
Immature Granulocytes: 0 %
Lymphocytes Relative: 21 %
Lymphs Abs: 1.7 10*3/uL (ref 0.7–4.0)
MCH: 29.1 pg (ref 26.0–34.0)
MCHC: 33.2 g/dL (ref 30.0–36.0)
MCV: 87.6 fL (ref 80.0–100.0)
Monocytes Absolute: 0.6 10*3/uL (ref 0.1–1.0)
Monocytes Relative: 7 %
Neutro Abs: 5.7 10*3/uL (ref 1.7–7.7)
Neutrophils Relative %: 69 %
Platelets: 227 10*3/uL (ref 150–400)
RBC: 4.36 MIL/uL (ref 3.87–5.11)
RDW: 13.5 % (ref 11.5–15.5)
WBC: 8.3 10*3/uL (ref 4.0–10.5)
nRBC: 0 % (ref 0.0–0.2)

## 2023-05-21 LAB — LACTATE DEHYDROGENASE: LDH: 126 U/L (ref 98–192)

## 2023-05-21 LAB — VITAMIN B12: Vitamin B-12: 815 pg/mL (ref 180–914)

## 2023-05-24 LAB — PROTEIN ELECTROPHORESIS, SERUM
A/G Ratio: 1.1 (ref 0.7–1.7)
Albumin ELP: 3.8 g/dL (ref 2.9–4.4)
Alpha-1-Globulin: 0.3 g/dL (ref 0.0–0.4)
Alpha-2-Globulin: 0.8 g/dL (ref 0.4–1.0)
Beta Globulin: 1.1 g/dL (ref 0.7–1.3)
Gamma Globulin: 1.3 g/dL (ref 0.4–1.8)
Globulin, Total: 3.5 g/dL (ref 2.2–3.9)
M-Spike, %: 0.8 g/dL — ABNORMAL HIGH
Total Protein ELP: 7.3 g/dL (ref 6.0–8.5)

## 2023-05-24 LAB — KAPPA/LAMBDA LIGHT CHAINS
Kappa free light chain: 19.9 mg/L — ABNORMAL HIGH (ref 3.3–19.4)
Kappa, lambda light chain ratio: 0.82 (ref 0.26–1.65)
Lambda free light chains: 24.4 mg/L (ref 5.7–26.3)

## 2023-05-24 LAB — METHYLMALONIC ACID, SERUM: Methylmalonic Acid, Quantitative: 130 nmol/L (ref 0–378)

## 2023-05-27 ENCOUNTER — Inpatient Hospital Stay (HOSPITAL_BASED_OUTPATIENT_CLINIC_OR_DEPARTMENT_OTHER): Payer: Medicare Other | Admitting: Oncology

## 2023-05-27 ENCOUNTER — Encounter: Payer: Self-pay | Admitting: Oncology

## 2023-05-27 VITALS — BP 137/76 | HR 71 | Temp 97.7°F | Resp 18 | Wt 204.0 lb

## 2023-05-27 DIAGNOSIS — D649 Anemia, unspecified: Secondary | ICD-10-CM | POA: Diagnosis not present

## 2023-05-27 DIAGNOSIS — Z87891 Personal history of nicotine dependence: Secondary | ICD-10-CM | POA: Diagnosis not present

## 2023-05-27 DIAGNOSIS — I129 Hypertensive chronic kidney disease with stage 1 through stage 4 chronic kidney disease, or unspecified chronic kidney disease: Secondary | ICD-10-CM | POA: Diagnosis not present

## 2023-05-27 DIAGNOSIS — D472 Monoclonal gammopathy: Secondary | ICD-10-CM | POA: Diagnosis not present

## 2023-05-27 DIAGNOSIS — Z807 Family history of other malignant neoplasms of lymphoid, hematopoietic and related tissues: Secondary | ICD-10-CM | POA: Diagnosis not present

## 2023-05-27 DIAGNOSIS — N1831 Chronic kidney disease, stage 3a: Secondary | ICD-10-CM | POA: Diagnosis not present

## 2023-05-27 DIAGNOSIS — E1122 Type 2 diabetes mellitus with diabetic chronic kidney disease: Secondary | ICD-10-CM | POA: Diagnosis not present

## 2023-05-27 NOTE — Progress Notes (Signed)
[facility] [address], [place] [address]   CLINIC:  Medical Oncology/Hematology  PCP:  Kerri Perches, MD [address], [address] [place] [address] [phone]   REASON FOR VISIT:  Follow-up for IgG lambda MGUS   PRIOR THERAPY: None  CURRENT THERAPY: Surveillance  INTERVAL HISTORY:   Victoria Mcconnell 77 y.o. female returns for routine follow-up of MGUS.  She was last seen in clinic by Rojelio Brenner on 11/26/2022.  In the interim, she denies any hospitalizations, surgeries or changes in her baseline health.  She was evaluated by [facility] for onychomycosis of toenails.   Today, she reports doing well.  Energy and appetite is 100%.  Denies any pain.  She is taking 500 mcg B12 supplements daily.  Denies any B symptoms including unintentional weight loss, fatigue, drenching night sweats or change in appetite.  Denies any bone pain.   ASSESSMENT & PLAN:  1.  MGUS, IgG lambda - Seen at the request of her nephrologist (Dr. Bufford Buttner) for work-up of possible MGUS. - Laboratory work-up sent by her nephrologist (10/14/2021) shows M spike of 1.0 and immunofixation showing IgG lambda monoclonal protein.  Kappa light chains 27.4 with lambda light chains 32.1 and ratio 0.85. - 24-hour urine with UPEP/U IFE was negative.  Normal beta-2 microglobulin.  Normal LDH. - Most recent skeletal survey (11/19/2022) was negative for any suspicious osseous lesions - She denies any new bone pain, B symptoms, or neurologic changes. - Patient's sister has multiple myeloma in remission after stem cell transplant - Patient meets diagnostic criteria for MGUS only.  No smoldering myeloma or multiple myeloma at this time. -Labs from 05/21/2023 reveal an M spike of 0.8 (0.8).  CBC is unremarkable.  Kappa free light chains elevated at 19.9.  Kappa lambda light chain ratio normal at 0.82.  Creatinine 1.15 which is at baseline.  Potassium 3.3.  -There is no  concern for progression to myeloma at this time.  No indication for treatment of bone marrow biopsy.  We will continue to monitor every 6 months. -Repeat bone survey annually-next due in March 2025.  2.  Normocytic anemia - Hemoglobin ranged from 10.9-normal over the past 5 years. -Anemia likely secondary to B12 deficiency (resolved) and CKD. -Hemoglobin from 05/21/2023 is 12.7.  Most recent creatinine is 1.15.  - Hematology workup (11/19/2022): Ferritin 130, iron saturation 24%.  Normal folate, LDH, copper. - B12 level from 05/21/2023 is 815 (223) and MMA is 130. -Continue B12 supplements. -Will recheck labs in 6 months.  3.  Other history - PMH: CKD stage IIIa, hypertension, type 2 diabetes mellitus - SOCIAL: She is retired.  She is a former smoker, who smokes cigarettes x5 years but quit 40+ years ago.  She denies any alcohol or illicit drugs. - FAMILY: Patient's sister had multiple myeloma, underwent SCT but relapsed and is now on maintenance medications.  Patient's father had prostate cancer.  PLAN SUMMARY: >> Continue B12 supplements. >> Return to clinic in 6 months with labs a few days before (CBC with differential, CMP, B12, MMA, protein electrophoresis, immunofixation and light chains).  >> Will need bone scan prior to next visit.  Orders placed.     REVIEW OF SYSTEMS:   Review of Systems  Psychiatric/Behavioral:  The patient is nervous/anxious.      PHYSICAL EXAM:  ECOG PERFORMANCE STATUS: 0 - Asymptomatic  Vitals:   05/27/23 1347  BP: 137/76  Pulse: 71  Resp:  18  Temp: 97.7 F (36.5 C)  SpO2: 95%   Filed Weights   05/27/23 1347  Weight: 204 lb (92.5 kg)   Physical Exam Constitutional:      Appearance: Normal appearance.  Cardiovascular:     Rate and Rhythm: Normal rate and regular rhythm.  Pulmonary:     Effort: Pulmonary effort is normal.     Breath sounds: Normal breath sounds.  Abdominal:     General: Bowel sounds are normal.     Palpations: Abdomen is  soft.  Musculoskeletal:        General: No swelling. Normal range of motion.  Neurological:     Mental Status: She is alert and oriented to person, place, and time. Mental status is at baseline.     PAST MEDICAL/SURGICAL HISTORY:  Past Medical History:  Diagnosis Date   Allergy    seasonal allergies   Anxiety    Arthritis    Depression, major, single episode, moderate (HCC) 07/01/2015   Diabetes mellitus    Glaucoma    Hyperlipidemia    Hypertension    Osteopenia    Past Surgical History:  Procedure Laterality Date   ABDOMINAL HYSTERECTOMY     SPINE SURGERY N/A 11/11/2020   lam facetectomy & foramotomy 1 vrt sgm lumbar    SOCIAL HISTORY:  Social History   Socioeconomic History   Marital status: Widowed    Spouse name: Not on file   Number of children: Not on file   Years of education: Not on file   Highest education level: Bachelor's degree (e.g., BA, AB, BS)  Occupational History   Not on file  Tobacco Use   Smoking status: Former    Current packs/day: 0.00    Types: Cigarettes    Quit date: 09/08/1981    Years since quitting: 41.7   Smokeless tobacco: Never  Substance and Sexual Activity   Alcohol use: No   Drug use: No   Sexual activity: Not Currently  Other Topics Concern   Not on file  Social History Narrative   Not on file   Social Determinants of Health   Financial Resource Strain: Low Risk  (01/04/2023)   Overall Financial Resource Strain (CARDIA)    Difficulty of Paying Living Expenses: Not hard at all  Food Insecurity: No Food Insecurity (01/04/2023)   Hunger Vital Sign    Worried About Running Out of Food in the Last Year: Never true    Ran Out of Food in the Last Year: Never true  Transportation Needs: No Transportation Needs (01/04/2023)   PRAPARE - Administrator, Civil Service (Medical): No    Lack of Transportation (Non-Medical): No  Physical Activity: Sufficiently Active (01/04/2023)   Exercise Vital Sign    Days of  Exercise per Week: 4 days    Minutes of Exercise per Session: 60 min  Stress: No Stress Concern Present (01/04/2023)   [employer] of Occupational Health - Occupational Stress Questionnaire    Feeling of Stress : Not at all  Social Connections: Moderately Integrated (01/04/2023)   Social Connection and Isolation Panel [NHANES]    Frequency of Communication with Friends and Family: More than three times a week    Frequency of Social Gatherings with Friends and Family: More than three times a week    Attends Religious Services: More than 4 times per year    Active Member of [employer] or Organizations: Yes    Attends Banker Meetings: More than 4 times  per year    Marital Status: Widowed  Catering manager Violence: Not on file    FAMILY HISTORY:  Family History  Problem Relation Age of Onset   Arthritis Mother    Cancer Sister    Diabetes Sister    Hyperlipidemia Sister    Hypertension Sister     CURRENT MEDICATIONS:  Outpatient Encounter Medications as of 05/27/2023  Medication Sig   acetaminophen (TYLENOL) 500 MG tablet Take 500 mg by mouth 2 (two) times daily as needed.   amLODipine (NORVASC) 10 MG tablet Take 1 tablet (10 mg total) by mouth daily.   atorvastatin (LIPITOR) 40 MG tablet Take 1 tablet (40 mg total) by mouth daily.   brimonidine (ALPHAGAN) 0.2 % ophthalmic solution Place 1 drop into both eyes in the morning and at bedtime.   Cholecalciferol (VITAMIN D) 50 MCG (2000 UT) tablet Take 1 tablet (2,000 Units total) by mouth daily.   cyanocobalamin (VITAMIN B12) 500 MCG tablet Take 1 tablet (500 mcg total) by mouth daily.   dorzolamide-timolol (COSOPT) 22.3-6.8 MG/ML ophthalmic solution Place 1 drop into both eyes 2 times daily.   escitalopram (LEXAPRO) 20 MG tablet Take half tablet by  mouth every day   fluticasone (FLONASE) 50 MCG/ACT nasal spray Place 1 spray into both nostrils daily.   glucose blood (ONETOUCH ULTRA) test strip USE AS DIRECTED TO MONITOR  FASTING BLOOD SUGAR ONCE DAILY   JARDIANCE 25 MG TABS tablet Take 25 mg by mouth daily.   Lancets MISC Please dispense based on patient and insurance preference. Use as directed to monitor FSBS 1x daily. Dx: E11.9.   LINZESS 72 MCG capsule TAKE 1 CAPSULE BY MOUTH  DAILY BEFORE BREAKFAST   montelukast (SINGULAIR) 10 MG tablet Take one tablet at bedtimeas needed, try to wean off   valsartan-hydrochlorothiazide (DIOVAN-HCT) 160-25 MG tablet TAKE 1 TABLET BY MOUTH DAILY   [DISCONTINUED] clotrimazole-betamethasone (LOTRISONE) cream Apply twice daily to affected are(s) for 10 days , then as needed   No facility-administered encounter medications on file as of 05/27/2023.    ALLERGIES:  No Known Allergies  LABORATORY DATA:  I have reviewed the labs as listed.  CBC    Component Value Date/Time   WBC 8.3 05/21/2023 1046   RBC 4.36 05/21/2023 1046   HGB 12.7 05/21/2023 1046   HCT 38.2 05/21/2023 1046   PLT 227 05/21/2023 1046   MCV 87.6 05/21/2023 1046   MCH 29.1 05/21/2023 1046   MCHC 33.2 05/21/2023 1046   RDW 13.5 05/21/2023 1046   LYMPHSABS 1.7 05/21/2023 1046   MONOABS 0.6 05/21/2023 1046   EOSABS 0.2 05/21/2023 1046   BASOSABS 0.1 05/21/2023 1046      Latest Ref Rng & Units 05/21/2023   10:46 AM 04/23/2023   11:04 AM 03/17/2023   11:06 AM  CMP  Glucose 70 - 99 mg/dL 161  96  096   BUN 8 - 23 mg/dL 13  12  12    Creatinine 0.44 - 1.00 mg/dL 0.45  4.09  8.11   Sodium 135 - 145 mmol/L 137  141  141   Potassium 3.5 - 5.1 mmol/L 3.3  3.8  4.0   Chloride 98 - 111 mmol/L 104  105  106   CO2 22 - 32 mmol/L 25  21  22    Calcium 8.9 - 10.3 mg/dL 91.4  78.2  95.6   Total Protein 6.5 - 8.1 g/dL 8.0  7.3    Total Bilirubin 0.3 - 1.2 mg/dL 0.9  0.4    Alkaline Phos 38 - 126 U/L 76  86    AST 15 - 41 U/L 17  19    ALT 0 - 44 U/L 15  12      DIAGNOSTIC IMAGING:  I have independently reviewed the relevant imaging and discussed with the patient.   WRAP UP:  All questions were answered.  The patient knows to call the clinic with any problems, questions or concerns.  Medical decision making: Moderate  Time spent on visit: I spent 20 minutes counseling the patient face to face. The total time spent in the appointment was 30 minutes and more than 50% was on counseling.  Mauro Kaufmann, NP  05/27/23 1:58 PM

## 2023-05-28 ENCOUNTER — Ambulatory Visit: Payer: Medicare Other | Admitting: Physician Assistant

## 2023-06-14 ENCOUNTER — Encounter: Payer: Medicare Other | Admitting: *Deleted

## 2023-06-19 ENCOUNTER — Other Ambulatory Visit: Payer: Self-pay | Admitting: Family Medicine

## 2023-07-06 ENCOUNTER — Other Ambulatory Visit: Payer: Self-pay | Admitting: Family Medicine

## 2023-07-06 MED ORDER — LINACLOTIDE 72 MCG PO CAPS: 72.0000 ug | ORAL_CAPSULE | Freq: Every day | ORAL | 3 refills | Status: AC

## 2023-07-19 DIAGNOSIS — H401131 Primary open-angle glaucoma, bilateral, mild stage: Secondary | ICD-10-CM | POA: Diagnosis not present

## 2023-07-20 ENCOUNTER — Other Ambulatory Visit: Payer: Self-pay | Admitting: Family Medicine

## 2023-07-23 ENCOUNTER — Telehealth: Payer: Self-pay

## 2023-07-23 NOTE — Telephone Encounter (Signed)
Patient left message wanting to know about enrollment assistance for Linzess.

## 2023-07-29 ENCOUNTER — Other Ambulatory Visit: Payer: Self-pay | Admitting: Family Medicine

## 2023-07-30 ENCOUNTER — Telehealth: Payer: Self-pay

## 2023-07-30 NOTE — Telephone Encounter (Signed)
Copied from CRM [id number]. Topic: General - Call Back - No Documentation >> Jul 30, 2023 11:10 AM Adelina Mings wrote: Reason for CRM: needs info on the scholarship for the medication she needs for no cost  Ok to refer for pharmacy assistance ?

## 2023-08-13 ENCOUNTER — Ambulatory Visit: Payer: Self-pay | Admitting: *Deleted

## 2023-08-13 NOTE — Patient Instructions (Addendum)
Visit Information  Thank you for taking time to visit with me today. Please don't hesitate to contact me if I can be of assistance to you.    Please use this site to check the medicine interaction and contraindications  [email or web address]  This site was used to find the 2024 drug list/formulary for your insurance plan-  [email or web address]   Brand name are (B) drugs, generic are (G) drugs  "Coverage rules or limits on use" is any special requirements for coverage of your drug.  quantity limits (QL) = a drug with restriction amounts  PA - Prior authorization -requires you or your MD to get prior approval The plan needs more information from your MD to make sure the drug is being used &covered correctly by Medicare for your medical condition. Certain drugs may be covered by either Medicare Part B (MD & outpatient health care) or Medicare Part D (prescription drugs) depending on how it is used. If you don't get prior approval, the plan may not cover the drug. QL - Quantity limits The plan will cover only a certain amount of this drug for 1 copay or over a certain number of days-to ensure safe &effective use of the drug. If your MD prescribes more than this amount or thinks the limit is not right for your situation, you or your MD can ask  the plan to cover the additional quantity. ST - Step therapy-effective, lower-cost drugs that treat the same medical condition as this drug. You may be required to try 1 or more of these other drugs before the plan will cover your drug. B/D covered by Medicare Part B or Part D LA - Limited access -if the FDA says the drug can be given out only by certain  facilities or MDs DL - Dispensing limit for this drug. -limited to a 61-month supply/prescription.   Blood Glucose Regulators - Antidiabetics  Acarbose (Oral Tablet) G 1 QL  Bydureon BCise (Subcutaneous Auto-Injector) B 3 PA; QL  Byetta  Pen (Subcutaneous Solution Pen-Injector) B 4 PA; QL  Byetta Pen (Subcutaneous Solution Pen-Injector) B 4 PA; QL  Cycloset (Oral Tablet) B 4 PA; QL  Farxiga (Oral Tablet) B 3 QL  Glimepiride (1MG  Oral Tablet, 2MG  Oral Tablet, 4MG  Oral Tablet) G 1 QL  Glipizide ER (Oral Tablet Extended Release 24 Hour) G 1 QL  Glipizide (5 or 10 mg Oral Tab Immediate Release) G 1 QL  Glipizide-Metformin HCl (Oral Tablet) G 1 QL  Glyxambi (Oral Tablet) B 3 QL  Janumet (Oral Tablet Immediate Release) B 3 QL  Janumet XR (Oral Tablet Extended Release 24 Hour) B 3 QL  Januvia (Oral Tablet) B 3 QL  Jardiance (Oral Tablet) B 3 QL  Jentadueto (2.5-1000MG  Oral Tablet, 2.5-500MG  Oral Tablet) B 3 QL  Jentadueto XR (Oral Tablet Extended Release 24 Hour) B 3 QL  Metformin HCl ER (Extended Release 24 Hr) (Generic Glucophage XR) G1 QL  Metformin HCl (Oral Solution) G 1 QL  Metformin HCl (1000MG  Oral Tablet Immediate Release, 500 MG Oral Tablet Immediate Release, 850MG  Oral Tab Immediate Release) G 1 QL  Miglitol (Oral Tablet) G 4 QL  Mounjaro (10MG /0.5ML Subcutaneous Solution  Pen-Injector, 12.5MG /0.5ML Subcutaneous Solution  Pen-Injector, 15MG /0.5ML Subcutaneous Solution  Pen-Injector, 2.5MG /0.5ML Subcutaneous Solution  Pen-Injector, 5MG /0.5ML Subcutaneous Solution  Pen-Injector, 7.5MG /0.5ML Subcutaneous Solution  Pen-Injector) B 3 PA; QL  Nateglinide (Oral Tablet) G 1 QL  Ozempic (0.25MG /DOSE or 0.5MG /DOSE) (2MG /3ML  Subcutaneous Solution Pen-Injector)B 3 P A; QL  Ozempic (1MG /DOSE)(4MG /3ML Subcutaneous  Solution Pen-Injector)B 3 PA;QL  Ozempic (2MG /DOSE) (8MG /3ML Subcutaneous Solution Pen-Injector)B3 PA QL  Pioglitazone HCl (Oral Tablet) G 1 QL  Pioglitazone HCl-Glimepiride (Oral Tablet) G 1 QL  Pioglitazone HCl-Metformin HCl (Oral Tablet) G 1 QL  Repaglinide (Oral Tablet) G 1 QL  Rybelsus (Oral Tablet) B 3 PA; QL  Soliqua (Subcutaneous Solution Pen-Injector) B 3 PA; QL  Synjardy (Oral Tablet  Immediate Release) B 3 QL  Synjardy XR (Oral Tablet Extended Release 24 Hour) B 3 QL  Tradjenta (Oral Tablet) B 3 QL  Trijardy XR (Oral Tablet Extended Release 24 Hour) B 3 QL  Trulicity (0.75MG /0.5ML Subcutaneous Solution  Pen-Injector, 1.5MG /0.5ML Subcutaneous Solution  Pen-Injector, 3MG /0.5ML Subcutaneous Solution  Pen-Injector, 4.5MG /0.5ML Subcutaneous Solution Pen-Injector) B 3 PA; QL  Xigduo XR (Oral Tablet Extended Release 24 Hour) B 3 QL    Gastrointestinal Agents  Anti-Constipation Agents Constulose (Oral Solution) G 2 Enulose (Oral Solution) G 2 Generlac (Oral Solution) G 2 Lactulose (10GM/15ML Oral Solution) G 2 Linzess (Oral Capsule) B 3 QL  Lubiprostone (Oral Capsule) G 3 QL  Motegrity (Oral Tablet) B 4 QL  Movantik (Oral Tablet) B 3 QL  Relistor (Oral Tablet) B 5 PA; DL; QL  Relistor (Subcutaneous Solution) B 5 PA; DL  Trulance (Oral Tablet) B 4 QL   The site for 2025's formulary was not found but the below site indicates [facility] Advantage from [facility]-0022 (HMO-POS) offers prescription drug coverage, with an annual drug deductible of $340.00 (excludes Tiers 1 and 2) https://www.medicareadvantage.com/plans/aarp-medicare-advantage-from-[facility]-0022-hmo-pos-h5253-038-000  Following are the goals we discussed today:   Goals Addressed             This Visit's Progress    Home management/improvement of Chronic Kidney Disease, hypertension, diabetes, obtain medicines- care coordination services       Interventions Today    Flowsheet Row Most Recent Value  Chronic Disease   Chronic disease during today's visit Chronic Kidney Disease/End Stage Renal Disease (ESRD), Hypertension (HTN), Diabetes, Other  [medicine cost for Linzess, Jardiance]  General Interventions   General Interventions Discussed/Reviewed General Interventions Reviewed, [facility], Doctor Visits, Communication with  Doctor Visits Discussed/Reviewed Doctor Visits Reviewed, PCP,  Specialist  PCP/Specialist Visits Compliance with follow-up visit  Communication with Pharmacists  [place] outreach to [place] E CPhT related to status of Linzess application]  Exercise Interventions   Exercise Discussed/Reviewed Exercise Reviewed, Physical Activity  Physical Activity Discussed/Reviewed Physical Activity Reviewed  Education Interventions   Education Provided Provided Education  [insurance coverage comparison for possible better cost for Jardiance. Medication assistance programs, 2025 national senior medicine CAP ($2000), Relationship of Diabetes, Kidney disease, Hypertension]  Provided Verbal Education On Labs, Mental Health/Coping with Illness, Medication, Development worker, community, Community Resources  Mental Health Interventions   Mental Health Discussed/Reviewed Mental Health Reviewed, Coping Strategies  Pharmacy Interventions   Pharmacy Dicussed/Reviewed Pharmacy Topics Reviewed, Affording Medications, Medications and their functions              Our next appointment is by telephone on 08/20/23 at 1130  Please call the care guide team at [phone] if you need to cancel or reschedule your appointment.   If you are experiencing a Mental Health or Behavioral Health Crisis or need someone to talk to, please call the Suicide and Crisis Lifeline: 988 call the [place] National Suicide Prevention Lifeline: 430-599-4010 or TTY: (740) 353-1939 TTY 252-565-4312) to talk to a trained counselor call 1-800-273-TALK (toll free, 24 hour hotline) call the [facility]: (254) 865-9574 call 911   Patient  verbalizes understanding of instructions and care plan provided today and agrees to view in MyChart. Active MyChart status and patient understanding of how to access instructions and care plan via MyChart confirmed with patient.     The patient has been provided with contact information for the care management team and has been advised to call with any health related  questions or concerns.   Bunny Lowdermilk L. Noelle Penner, RN, BSN, [facility]  VBCI Care Management Coordinator  [phone]  Fax: [phone]

## 2023-08-13 NOTE — Patient Outreach (Signed)
  Care Coordination   Follow Up Visit Note   08/13/2023 Name: Victoria Mcconnell MRN: [id number] DOB: 14-Dec-1945  Victoria Mcconnell is a 77 y.o. year old female who sees Lodema Hong, Milus Mallick, MD for primary care. I spoke with  Victoria Mcconnell by phone today.  What matters to the patients health and wellness today?  Victoria Mcconnell capsule & Jardiance cost, Chronic Kidney disease (CKD) management  Victoria application completed and is pending a return call her (camille Everette CPhT ) after touching base on 07/05/23 $158 is her present cost for Jardiance   tried to compare united healthcare insurance coverages for a possible   She is aware that individual out-of-pocket spending for covered Part D drugs will be capped at $2,000, meaning that beneficiaries will have no cost sharing above that amount  Has an united healthcare coordinator that she will outreach to in regards to her medicine costs also  Neurontin was found by her to a cause of chronic kidney disease and has started it She now states she's using Tylenol for most pain episodes     Goals Addressed             This Visit's Progress    Home management/improvement of Chronic Kidney Disease, hypertension, diabetes, obtain medicines- care coordination services       Interventions Today    Flowsheet Row Most Recent Value  Chronic Disease   Chronic disease during today's visit Chronic Kidney Disease/End Stage Renal Disease (ESRD), Hypertension (HTN), Diabetes, Other  [medicine cost for Victoria, Jardiance]  General Interventions   General Interventions Discussed/Reviewed General Interventions Reviewed, [facility], Doctor Visits, Communication with  Doctor Visits Discussed/Reviewed Doctor Visits Reviewed, PCP, Specialist  PCP/Specialist Visits Compliance with follow-up visit  Communication with Pharmacists  [place] outreach to [place] E CPhT related to status of Victoria application]  Exercise Interventions   Exercise  Discussed/Reviewed Exercise Reviewed, Physical Activity  Physical Activity Discussed/Reviewed Physical Activity Reviewed  Education Interventions   Education Provided Provided Education  [insurance coverage comparison for possible better cost for Jardiance. Medication assistance programs, 2025 national senior medicine CAP ($2000), Relationship of Diabetes, Kidney disease, Hypertension]  Provided Verbal Education On Labs, Mental Health/Coping with Illness, Medication, Development worker, community, [employer]  Mental Health Interventions   Mental Health Discussed/Reviewed Mental Health Reviewed, Coping Strategies  Pharmacy Interventions   Pharmacy Dicussed/Reviewed Pharmacy Topics Reviewed, Affording Medications, Medications and their functions              SDOH assessments and interventions completed:  No     Care Coordination Interventions:  Yes, provided   Follow up plan: Follow up call scheduled for 08/20/23    Encounter Outcome:  Patient Visit Completed   [name], RN, BSN, [facility]  VBCI Care Management Coordinator  [phone]  Fax: [phone]

## 2023-08-16 ENCOUNTER — Ambulatory Visit (INDEPENDENT_AMBULATORY_CARE_PROVIDER_SITE_OTHER): Payer: Medicare Other | Admitting: Podiatry

## 2023-08-16 ENCOUNTER — Encounter: Payer: Self-pay | Admitting: Podiatry

## 2023-08-16 VITALS — Ht 68.0 in | Wt 204.0 lb

## 2023-08-16 DIAGNOSIS — M79674 Pain in right toe(s): Secondary | ICD-10-CM | POA: Diagnosis not present

## 2023-08-16 DIAGNOSIS — M79675 Pain in left toe(s): Secondary | ICD-10-CM

## 2023-08-16 DIAGNOSIS — B351 Tinea unguium: Secondary | ICD-10-CM | POA: Diagnosis not present

## 2023-08-16 NOTE — Progress Notes (Signed)
       Subjective:  Patient ID: Victoria Mcconnell, female    DOB: 1946/01/07,  MRN: [id number]   Doralee Albino Alleva presents to clinic today for:  Chief Complaint  Patient presents with   Nail Problem    RFC   Patient notes nails are thick, discolored, elongated and painful in shoegear when trying to ambulate.  Notes her blood sugar has been good and was under 110mg /dL this morning.  PCP is Kerri Perches, MD.  Past Medical History:  Diagnosis Date   Allergy    seasonal allergies   Anxiety    Arthritis    Depression, major, single episode, moderate (HCC) 07/01/2015   Diabetes mellitus    Glaucoma    Hyperlipidemia    Hypertension    Osteopenia     Past Surgical History:  Procedure Laterality Date   ABDOMINAL HYSTERECTOMY     SPINE SURGERY N/A 11/11/2020   lam facetectomy & foramotomy 1 vrt sgm lumbar    No Known Allergies  Review of Systems: Negative except as noted in the HPI.  Objective:  KONDA MORITA is a pleasant 77 y.o. female in NAD. AAO x 3.  Vascular Examination: Capillary refill time is 3-5 seconds to toes bilateral. Palpable pedal pulses b/l LE. Digital hair present b/l.  Skin temperature gradient WNL b/l. No varicosities b/l. No cyanosis noted b/l.   Dermatological Examination: Pedal skin with normal turgor, texture and tone b/l. No open wounds. No interdigital macerations b/l. Toenails x10 are 3mm thick, discolored, dystrophic with subungual debris. There is pain with compression of the nail plates.  They are elongated x10     Latest Ref Rng & Units 04/23/2023   11:04 AM 01/18/2023   10:32 AM  Hemoglobin A1C  Hemoglobin-A1c 4.8 - 5.6 % 6.1  6.1    Assessment/Plan: 1. Pain due to onychomycosis of toenails of both feet     The mycotic toenails were sharply debrided x10 with sterile nail nippers and a power debriding burr to decrease bulk/thickness and length.    Return in about 3 months (around 11/14/2023) for [facility].   Clerance Lav, DPM,  FACFAS [facility]     [address] [place], [place] [address]                Office [phone]  Fax [phone]

## 2023-08-18 ENCOUNTER — Ambulatory Visit (HOSPITAL_COMMUNITY)
Admission: RE | Admit: 2023-08-18 | Discharge: 2023-08-18 | Disposition: A | Discharge status: Home / Self Care | Payer: Medicare Other | Source: Ambulatory Visit | Attending: Family Medicine | Admitting: Family Medicine

## 2023-08-18 ENCOUNTER — Encounter (HOSPITAL_COMMUNITY): Payer: Self-pay

## 2023-08-18 DIAGNOSIS — Z1231 Encounter for screening mammogram for malignant neoplasm of breast: Secondary | ICD-10-CM | POA: Diagnosis not present

## 2023-08-20 ENCOUNTER — Ambulatory Visit: Payer: Self-pay | Admitting: *Deleted

## 2023-08-20 DIAGNOSIS — K5904 Chronic idiopathic constipation: Secondary | ICD-10-CM

## 2023-08-20 DIAGNOSIS — E1159 Type 2 diabetes mellitus with other circulatory complications: Secondary | ICD-10-CM

## 2023-08-20 DIAGNOSIS — R195 Other fecal abnormalities: Secondary | ICD-10-CM

## 2023-08-20 DIAGNOSIS — Z599 Problem related to housing and economic circumstances, unspecified: Secondary | ICD-10-CM

## 2023-08-20 NOTE — Patient Outreach (Incomplete Revision)
  Care Coordination   Follow Up Visit Note   08/20/2023 Name: Victoria Mcconnell MRN: [id number] DOB: [birth year]  Victoria Mcconnell is a 77 y.o. year old female who sees Lodema Hong, Milus Mallick, MD for primary care. I spoke with  Victoria Mcconnell by phone today.  What matters to the patients health and wellness today?  Medication assistance still pending for Linzess & Jardiance  Voiced understanding of response from [employer] still does not have the application for Linzess & Jardiance She will ask her MD to change her to a lower cost medicine     Goals Addressed             This Visit's Progress    Home management/improvement of Chronic Kidney Disease, hypertension, diabetes, obtain medicines- care coordination services   Not on track    Interventions Today    Flowsheet Row Most Recent Value  Chronic Disease   Chronic disease during today's visit Chronic Kidney Disease/End Stage Renal Disease (ESRD)  [medication assistance for linzess, Jardiance]  General Interventions   General Interventions Discussed/Reviewed General Interventions Reviewed, [facility], Communication with  Doctor Visits Discussed/Reviewed Doctor Visits Reviewed, PCP, Specialist  PCP/Specialist Visits Compliance with follow-up visit  Communication with Pharmacists  Exercise Interventions   Exercise Discussed/Reviewed Exercise Reviewed, Physical Activity  Physical Activity Discussed/Reviewed Physical Activity Reviewed  Education Interventions   Education Provided Provided Education  [Simplefill reduce or eliminate Linzess costs. apply online, call [phone] +LINZESS Savings Program patients with commercial insurance to pay as little as $30 for 30 or 90 days of Linzess.sign up by Texting "LINZESS" to [phone], [phone] or online]  Provided Verbal Education On [employer] previous education in my chart received]  Mental Health Interventions   Mental Health Discussed/Reviewed  Mental Health Reviewed, Coping Strategies  Pharmacy Interventions   Pharmacy Dicussed/Reviewed Pharmacy Topics Reviewed, Medications and their functions, Affording Medications, Referral to Pharmacist  Referral to Pharmacist Cannot afford medications  [Linzess & Jardiance]              SDOH assessments and interventions completed:  No     Care Coordination Interventions:  Yes, provided   Follow up plan: Follow up call scheduled for 08/27/23 1130    Encounter Outcome:  Patient Visit Completed   [name], RN, BSN, [facility]  VBCI Care Management Coordinator  [phone]  Fax: [phone]

## 2023-08-20 NOTE — Patient Outreach (Addendum)
  Care Coordination   Follow Up Visit Note   08/20/2023 Name: Victoria Mcconnell MRN: [id number] DOB: [birth year]  Victoria Mcconnell is a 77 y.o. year old female who sees Lodema Hong, Milus Mallick, MD for primary care. I spoke with  [name] by phone today.  What matters to the patients health and wellness today?  Medication assistance still pending for Linzess & Jardiance  Voiced understanding of response from [employer] still does not have the application for Linzess & Jardiance She will ask her MD to change her to a lower cost medicine     Goals Addressed             This Visit's Progress    Home management/improvement of Chronic Kidney Disease, hypertension, diabetes, obtain medicines- care coordination services   Not on track    Interventions Today    Flowsheet Row Most Recent Value  Chronic Disease   Chronic disease during today's visit Chronic Kidney Disease/End Stage Renal Disease (ESRD)  [medication assistance for linzess, Jardiance]  General Interventions   General Interventions Discussed/Reviewed General Interventions Reviewed, [facility], Communication with  Doctor Visits Discussed/Reviewed Doctor Visits Reviewed, PCP, Specialist  PCP/Specialist Visits Compliance with follow-up visit  Communication with Pharmacists  Exercise Interventions   Exercise Discussed/Reviewed Exercise Reviewed, Physical Activity  Physical Activity Discussed/Reviewed Physical Activity Reviewed  Education Interventions   Education Provided Provided Education  [Simplefill reduce or eliminate Linzess costs. apply online, call [phone] +LINZESS Savings Program patients with commercial insurance to pay as little as $30 for 30 or 90 days of Linzess.sign up by Texting "LINZESS" to [phone], [phone] or online]  Provided Verbal Education On [employer] previous education in my chart received]  Mental Health Interventions   Mental Health Discussed/Reviewed  Mental Health Reviewed, Coping Strategies  Pharmacy Interventions   Pharmacy Dicussed/Reviewed Pharmacy Topics Reviewed, Medications and their functions, Affording Medications, Referral to Pharmacist  Referral to Pharmacist Cannot afford medications  [Linzess & Jardiance]              SDOH assessments and interventions completed:  No     Care Coordination Interventions:  Yes, provided   Follow up plan: Follow up call scheduled for 08/27/23 1130    Encounter Outcome:  Patient Visit Completed   [name], RN, BSN, [facility]  VBCI Care Management Coordinator  [phone]  Fax: [phone]

## 2023-08-20 NOTE — Patient Instructions (Addendum)
Visit Information  Thank you for taking time to visit with me today. Please don't hesitate to contact me if I can be of assistance to you.   several patient assistance programs for Linzess, including:   Simplefill Helps reduce or eliminate Linzess prescription costs. You can apply online or call [phone].   [employer] Savings Program Allows patients with commercial insurance to pay as little as $30 for 30 or 90 days of Linzess. You can sign up by:  Texting "LINZESS" to [phone]  Calling [phone]  Signing up online   Patient Access Network [facility]) May be able to assist patients with unaffordable copays or deductibles. To qualify, you must meet one or more of the following criteria:  Your income is below 400% of the federal poverty level   You have Medicare insurance coverage that covers Linzess  [employer] Copay Program May be able to assist patients with unaffordable copays or deductibles. To qualify, you must meet the following criteria:  You live in and are being treated in the [place] are insured and your plan covers Linzess   Medicare Part D Extra Help Program Also known as "Extra Help," "Low-Income Subsidy" or "LIS," this program helps people with limited income and resources pay Medicare drug coverage premiums, deductibles, coinsurance, and other costs. You may qualify if you have Medicare and income below 150% of the Federal Poverty Limit (FPL).    Following are the goals we discussed today:   Goals Addressed             This Visit's Progress    Home management/improvement of Chronic Kidney Disease, hypertension, diabetes, obtain medicines- care coordination services   Not on track    Interventions Today    Flowsheet Row Most Recent Value  Chronic Disease   Chronic disease during today's visit Chronic Kidney Disease/End Stage Renal Disease (ESRD)  [medication assistance for linzess, Jardiance]  General Interventions   General  Interventions Discussed/Reviewed General Interventions Reviewed, [facility], Communication with  Doctor Visits Discussed/Reviewed Doctor Visits Reviewed, PCP, Specialist  PCP/Specialist Visits Compliance with follow-up visit  Communication with Pharmacists  Exercise Interventions   Exercise Discussed/Reviewed Exercise Reviewed, Physical Activity  Physical Activity Discussed/Reviewed Physical Activity Reviewed  Education Interventions   Education Provided Provided Education  [Simplefill reduce or eliminate Linzess costs. apply online, call [phone] +LINZESS Savings Program patients with commercial insurance to pay as little as $30 for 30 or 90 days of Linzess.sign up by Texting "LINZESS" to [phone], [phone] or online]  Provided Verbal Education On [employer] previous education in my chart received]  Mental Health Interventions   Mental Health Discussed/Reviewed Mental Health Reviewed, Coping Strategies  Pharmacy Interventions   Pharmacy Dicussed/Reviewed Pharmacy Topics Reviewed, Medications and their functions, Affording Medications, Referral to Pharmacist  Referral to Pharmacist Cannot afford medications  [Linzess & Jardiance]              Our next appointment is by telephone on 08/27/23 at 1130  Please call the care guide team at [phone] if you need to cancel or reschedule your appointment.   If you are experiencing a Mental Health or Behavioral Health Crisis or need someone to talk to, please call the Suicide and Crisis Lifeline: 988 call the [place] National Suicide Prevention Lifeline: (970) 866-7618 or TTY: 507-830-9435 TTY 5394553650) to talk to a trained counselor call 1-800-273-TALK (toll free, 24 hour hotline) call the [facility]: [phone] call 911   Patient verbalizes understanding of instructions  and care plan provided today and agrees to view in MyChart. Active MyChart status and patient understanding of  how to access instructions and care plan via MyChart confirmed with patient.     The patient has been provided with contact information for the care management team and has been advised to call with any health related questions or concerns.   Jaiyah Beining L. Noelle Penner, RN, BSN, [facility]  VBCI Care Management Coordinator  [phone]  Fax: [phone]

## 2023-08-20 NOTE — Patient Outreach (Signed)
  Care Coordination   08/20/2023 Name: ERVIE CLAYBURN MRN: [id number] DOB: [birth year]   Care Coordination Outreach Attempts:  An unsuccessful telephone outreach was attempted today to offer the patient information about available care coordination services.  Follow Up Plan:  Additional outreach attempts will be made to offer the patient care coordination information and services.   Encounter Outcome:  No Answer   Care Coordination Interventions:  Yes, provided  In the message left her the response from [facility] on 08/16/23 Due to our team having some recent changes, we have techs that are able to cover that office. She looks to be enrolled until 09/14/23 and would need to re-enroll. I can route the telephone note from 07/23/23 to the Rx Med Assistance team to make sure the app gets mailed to her.   Demarri Elie L. Noelle Penner, RN, BSN, [facility]  VBCI Care Management Coordinator  [phone]  Fax: [phone]

## 2023-08-27 ENCOUNTER — Ambulatory Visit: Payer: Self-pay | Admitting: *Deleted

## 2023-08-27 NOTE — Patient Instructions (Signed)
Visit Information  Thank you for taking time to visit with me today. Please don't hesitate to contact me if I can be of assistance to you.   Following are the goals we discussed today:   Goals Addressed             This Visit's Progress    Home management/improvement of Chronic Kidney Disease, hypertension, diabetes, obtain medicines- care coordination services   Not on track    Interventions Today    Flowsheet Row Most Recent Value  Chronic Disease   Chronic disease during today's visit Other, Diabetes  [linzess & jardiance medication assistance]  General Interventions   General Interventions Discussed/Reviewed General Interventions Reviewed, Communication with, Community Resources  [reviewed the referral sent to [employer] for [employer] and Linzess]  Communication with Pharmacists  [outreach to [employer]- left messge for a return call/[phone] & [phone] outreach to [place]]  Pharmacy Interventions   Pharmacy Dicussed/Reviewed Pharmacy Topics Reviewed, Affording Medications, Medications and their functions  Referral to Pharmacist Cannot afford medications  [can not afford Jardiance & Linzess]              Our next appointment is by telephone on 09/02/23 at 1:30 pm  Please call the care guide team at [phone] if you need to cancel or reschedule your appointment.   If you are experiencing a Mental Health or Behavioral Health Crisis or need someone to talk to, please call the Suicide and Crisis Lifeline: 988 call the [place] National Suicide Prevention Lifeline: 864-316-2454 or TTY: 484-305-0462 TTY (226)288-6775) to talk to a trained counselor call 1-800-273-TALK (toll free, 24 hour hotline) call the [facility]: [phone] call 911   Patient verbalizes understanding of instructions and care plan provided today and agrees to view in MyChart. Active MyChart status and patient understanding of how to access instructions and care plan  via MyChart confirmed with patient.     The patient has been provided with contact information for the care management team and has been advised to call with any health related questions or concerns.   Dhanush Jokerst L. Noelle Penner, RN, BSN, [facility]  VBCI Care Management Coordinator  [phone]  Fax: [phone]

## 2023-08-27 NOTE — Patient Outreach (Signed)
  Care Coordination   Follow Up Visit Note   08/27/2023 Name: Victoria Mcconnell MRN: [id number] DOB: 1946-02-23  Victoria Mcconnell is a 77 y.o. year old female who sees Victoria Mcconnell, Victoria Mallick, MD for primary care. I spoke with  Victoria Mcconnell by phone today.  What matters to the patients health and wellness today?  Linzess and Jardiance Patient nor RN CM have not received a call back from the message left last week Patient prefers to have all calls to her mobile number    Goals Addressed             This Visit's Progress    Home management/improvement of Chronic Kidney Disease, hypertension, diabetes, obtain medicines- care coordination services   Not on track    Interventions Today    Flowsheet Row Most Recent Value  Chronic Disease   Chronic disease during today's visit Other, Diabetes  [linzess & jardiance medication assistance]  General Interventions   General Interventions Discussed/Reviewed General Interventions Reviewed, Communication with, Community Resources  [reviewed the referral sent to [employer] for [employer] and Linzess]  Communication with Pharmacists  [outreach to [employer]- left messge for a return [phone] & [phone] outreach to [place]]  Pharmacy Interventions   Pharmacy Dicussed/Reviewed Pharmacy Topics Reviewed, Affording Medications, Medications and their functions  Referral to Pharmacist Cannot afford medications  [can not afford Jardiance & Linzess]              SDOH assessments and interventions completed:  No     Care Coordination Interventions:  Yes, provided   Follow up plan: Follow up call scheduled for 09/02/23    Encounter Outcome:  Patient Visit Completed   Victoria Bradford L. Noelle Penner, RN, BSN, [facility]  VBCI Care Management Coordinator  [phone]  Fax: [phone]

## 2023-09-01 ENCOUNTER — Other Ambulatory Visit: Payer: Self-pay | Admitting: Pharmacist

## 2023-09-01 ENCOUNTER — Encounter: Payer: Self-pay | Admitting: Pharmacist

## 2023-09-01 ENCOUNTER — Telehealth: Payer: Self-pay | Admitting: Pharmacist

## 2023-09-01 NOTE — Telephone Encounter (Signed)
Can you please mail patient assistance application to patient for : Linzess (this is re-enrollment) Jardiance 25mg  daily (new app)  Please fax PCP office with Provider portion  Thank you!

## 2023-09-02 ENCOUNTER — Ambulatory Visit: Payer: Self-pay | Admitting: *Deleted

## 2023-09-03 ENCOUNTER — Other Ambulatory Visit: Payer: Self-pay

## 2023-09-03 MED ORDER — EMPAGLIFLOZIN 25 MG PO TABS: 25.0000 mg | ORAL_TABLET | Freq: Every day | ORAL | 5 refills | Status: DC

## 2023-09-03 NOTE — Patient Instructions (Signed)
Visit Information  Thank you for taking time to visit with me today. Please don't hesitate to contact me if I can be of assistance to you.   Following are the goals we discussed today:   Goals Addressed             This Visit's Progress    Home management/improvement of Chronic Kidney Disease, hypertension, diabetes, obtain medication assistance- care coordination services   On track    Patient will have patient assistance forms completed for Linzess & Jardiance Patient will continue to attend all medical appointments   Interventions Today    Flowsheet Row Most Recent Value  Chronic Disease   Chronic disease during today's visit --  [Jardiance & Linzess online applications completed with patient 09/02/23, Spoke with pcp & pcp RN about patient forms for Jardiance & Linzess.  to be faxed to companies, email to pcp RN K Goins for pcp to complete her part prior to end of 2024]  General Interventions   General Interventions Discussed/Reviewed General Interventions Reviewed, [facility], Communication with  Communication with PCP/Specialists, Tax inspector with pcp & pcp RN. Confirmed patient received outreach from [employer] pharmacist]  Education Interventions   Education Provided Provided Education  [online Linzess & Jardiance forms to complete in 2024]  Provided Verbal Education On [employer], Other, [employer] Resources  Mental Health Interventions   Mental Health Discussed/Reviewed Mental Health Reviewed, Coping Strategies  Pharmacy Interventions   Pharmacy Dicussed/Reviewed Pharmacy Topics Reviewed, Medications and their functions, Affording Medications  Referral to Pharmacist Cannot afford medications  [not able to afford Jardiance or Linzess]             Our next appointment is by telephone on 09/30/22 at 2:30 pm  Please call the care guide team at [phone] if you need to cancel or reschedule your appointment.   If you are experiencing a Mental Health or  Behavioral Health Crisis or need someone to talk to, please call the Suicide and Crisis Lifeline: 988 call the [place] National Suicide Prevention Lifeline: 804 570 0921 or TTY: 857 631 0710 TTY 519-613-4999) to talk to a trained counselor call 1-800-273-TALK (toll free, 24 hour hotline) call the [facility]: (250) [phone] call 911   Patient verbalizes understanding of instructions and care plan provided today and agrees to view in MyChart. Active MyChart status and patient understanding of how to access instructions and care plan via MyChart confirmed with patient.     The patient has been provided with contact information for the care management team and has been advised to call with any health related questions or concerns.   Wess Baney L. Noelle Penner, RN, BSN, [facility]  VBCI Care Management Coordinator  [phone]  Fax: [phone]

## 2023-09-03 NOTE — Patient Outreach (Signed)
  Care Coordination   Follow Up Visit Note   09/03/2023 Update entry for 09/02/23 Name: TRANIYA ESTENSON MRN: [id number] DOB: 14-Mar-1946  LOREEN TYMA is a 77 y.o. year old female who sees Kerri Perches, MD for primary care. I spoke with  [name] by phone today.  What matters to the patients health and wellness today?  Medication assistance for Jardiance and Linzess Application completed online    Goals Addressed             This Visit's Progress    Home management/improvement of Chronic Kidney Disease, hypertension, diabetes, obtain medication assistance- care coordination services   On track    Patient will have patient assistance forms completed for Linzess & Jardiance Patient will continue to attend all medical appointments   Interventions Today    Flowsheet Row Most Recent Value  Chronic Disease   Chronic disease during today's visit --  [Jardiance & Linzess online applications completed with patient 09/02/23, Spoke with pcp & pcp RN about patient forms for Jardiance & Linzess.  to be faxed to companies, email to pcp RN K Goins for pcp to complete her part prior to end of 2024]  General Interventions   General Interventions Discussed/Reviewed General Interventions Reviewed, [facility], Communication with  Communication with PCP/Specialists, Tax inspector with pcp & pcp RN. Confirmed patient received outreach from [employer] pharmacist]  Education Interventions   Education Provided Provided Education  [online Linzess & Jardiance forms to complete in 2024]  Provided Verbal Education On [employer], Other, [employer] Resources  Mental Health Interventions   Mental Health Discussed/Reviewed Mental Health Reviewed, Coping Strategies  Pharmacy Interventions   Pharmacy Dicussed/Reviewed Pharmacy Topics Reviewed, Medications and their functions, Affording Medications  Referral to Pharmacist Cannot afford medications  [not able to afford Jardiance or Linzess]              SDOH assessments and interventions completed:  No     Care Coordination Interventions:  Yes, provided   Follow up plan: Follow up call scheduled for 09/30/22    Encounter Outcome:  Patient Visit Completed   [name], RN, BSN, [facility]  VBCI Care Management Coordinator  [phone]  Fax: [phone]

## 2023-09-12 ENCOUNTER — Other Ambulatory Visit: Payer: Self-pay | Admitting: Physician Assistant

## 2023-09-12 DIAGNOSIS — E538 Deficiency of other specified B group vitamins: Secondary | ICD-10-CM

## 2023-09-17 ENCOUNTER — Other Ambulatory Visit (HOSPITAL_COMMUNITY): Payer: Self-pay

## 2023-09-17 NOTE — Telephone Encounter (Signed)
 In process of mailing both applications.

## 2023-09-22 ENCOUNTER — Ambulatory Visit: Payer: Medicare Other | Admitting: Family Medicine

## 2023-09-28 ENCOUNTER — Telehealth: Payer: Self-pay

## 2023-09-28 NOTE — Progress Notes (Signed)
 Pharmacy Medication Assistance Program Note    09/28/2023  Patient ID: Victoria Mcconnell, female   DOB: 11/29/1945, 78 y.o.   MRN: [id number]     09/28/2023  Outreach Med[employer] One  Manufacturer Medication One [facility] Linzess   Type of Assistance Manufacturer Assistance  Date Application Sent to Patient 09/28/2023    Mailed.

## 2023-10-01 ENCOUNTER — Ambulatory Visit: Payer: Self-pay | Admitting: *Deleted

## 2023-10-01 DIAGNOSIS — E1159 Type 2 diabetes mellitus with other circulatory complications: Secondary | ICD-10-CM | POA: Diagnosis not present

## 2023-10-01 DIAGNOSIS — I10 Essential (primary) hypertension: Secondary | ICD-10-CM | POA: Diagnosis not present

## 2023-10-01 DIAGNOSIS — E785 Hyperlipidemia, unspecified: Secondary | ICD-10-CM | POA: Diagnosis not present

## 2023-10-01 DIAGNOSIS — E559 Vitamin D deficiency, unspecified: Secondary | ICD-10-CM | POA: Diagnosis not present

## 2023-10-01 NOTE — Patient Outreach (Signed)
  Care Coordination   Follow Up Visit Note   12/08/2023 updated note for 10/01/23 Name: Victoria Mcconnell MRN: [id number] DOB: 02-07-1946  Victoria Mcconnell is a 78 y.o. year old female who sees Kerri Perches, MD for primary care. I spoke with  [name] by phone today.  What matters to the patients health and wellness today?  Medication assistance Linzess, Jardiance  Has heard from Linzess but not Jardiance.  She has     Goals Addressed             This Visit's Progress    Home management/improvement of Chronic Kidney Disease, hypertension, diabetes, obtain medication assistance- care coordination services   On track    Patient will have patient assistance forms completed for Linzess & Jardiance- goal met Patient will continue to attend all medical appointments- goal met   Interventions Today    Flowsheet Row Most Recent Value  Chronic Disease   Chronic disease during today's visit Diabetes, Other  [medicines for IBS & Diabetes]  General Interventions   General Interventions Discussed/Reviewed General Interventions Reviewed, Community Resources  Education Interventions   Education Provided Provided Education  Provided Verbal Education On Medication  Pharmacy Interventions   Pharmacy Dicussed/Reviewed Pharmacy Topics Reviewed, Medications and their functions, Affording Medications  [followed up on the status of medication assistance for jardiance and Linzess]             SDOH assessments and interventions completed:  No     Care Coordination Interventions:  Yes, provided   Follow up plan: No further intervention required.   Encounter Outcome:  Patient Visit Completed   [name], RN, BSN, CCM [place]  [facility], [facility] RN Care Manager Direct Dial: [phone]  Fax: [phone]

## 2023-10-01 NOTE — Patient Instructions (Signed)
 Visit Information  Thank you for taking time to visit with me today. Please don't hesitate to contact me if I can be of assistance to you.   Following are the goals we discussed today:   Goals Addressed   None     Our next appointment is by telephone on na at na  Please call the care guide team at [phone] if you need to cancel or reschedule your appointment.   If you are experiencing a Mental Health or Behavioral Health Crisis or need someone to talk to, please call the Suicide and Crisis Lifeline: 988 call the [place] National Suicide Prevention Lifeline: 564-655-4583 or TTY: (952)760-2021 TTY (706)542-6810) to talk to a trained counselor call 1-800-273-TALK (toll free, 24 hour hotline) call the [facility]: [phone] call 911   Patient verbalizes understanding of instructions and care plan provided today and agrees to view in MyChart. Active MyChart status and patient understanding of how to access instructions and care plan via MyChart confirmed with patient.     The patient has been provided with contact information for the care management team and has been advised to call with any health related questions or concerns.   Rina Adney L. Noelle Penner, RN, BSN, CCM [place]  [facility], [facility] RN Care Manager Direct Dial: [phone]  Fax: [phone]

## 2023-10-02 LAB — CMP14+EGFR
ALT: 31 [IU]/L (ref 0–32)
AST: 15 [IU]/L (ref 0–40)
Albumin: 3.9 g/dL (ref 3.8–4.8)
Alkaline Phosphatase: 118 [IU]/L (ref 44–121)
BUN/Creatinine Ratio: 17 (ref 12–28)
BUN: 18 mg/dL (ref 8–27)
Bilirubin Total: 1.1 mg/dL (ref 0.0–1.2)
CO2: 25 mmol/L (ref 20–29)
Calcium: 9.8 mg/dL (ref 8.7–10.3)
Chloride: 97 mmol/L (ref 96–106)
Creatinine, Ser: 1.07 mg/dL — ABNORMAL HIGH (ref 0.57–1.00)
Globulin, Total: 3.2 g/dL (ref 1.5–4.5)
Glucose: 205 mg/dL — ABNORMAL HIGH (ref 70–99)
Potassium: 4.7 mmol/L (ref 3.5–5.2)
Sodium: 138 mmol/L (ref 134–144)
Total Protein: 7.1 g/dL (ref 6.0–8.5)
eGFR: 54 mL/min/{1.73_m2} — ABNORMAL LOW (ref >59)

## 2023-10-02 LAB — HEMOGLOBIN A1C
Est. average glucose Bld gHb Est-mCnc: 223 mg/dL
Hgb A1c MFr Bld: 9.4 % — ABNORMAL HIGH (ref 4.8–5.6)

## 2023-10-02 LAB — LIPID PANEL
Chol/HDL Ratio: 3 {ratio} (ref 0.0–4.4)
Cholesterol, Total: 126 mg/dL (ref 100–199)
HDL: 42 mg/dL (ref >39)
LDL Chol Calc (NIH): 63 mg/dL (ref 0–99)
Triglycerides: 113 mg/dL (ref 0–149)
VLDL Cholesterol Cal: 21 mg/dL (ref 5–40)

## 2023-10-02 LAB — TSH: TSH: 5.13 u[IU]/mL — ABNORMAL HIGH (ref 0.450–4.500)

## 2023-10-02 LAB — CBC
Hematocrit: 40.1 % (ref 34.0–46.6)
Hemoglobin: 13 g/dL (ref 11.1–15.9)
MCH: 27.9 pg (ref 26.6–33.0)
MCHC: 32.4 g/dL (ref 31.5–35.7)
MCV: 86 fL (ref 79–97)
Platelets: 148 10*3/uL — ABNORMAL LOW (ref 150–450)
RBC: 4.66 x10E6/uL (ref 3.77–5.28)
RDW: 14.8 % (ref 11.7–15.4)
WBC: 7.5 10*3/uL (ref 3.4–10.8)

## 2023-10-02 LAB — VITAMIN D 25 HYDROXY (VIT D DEFICIENCY, FRACTURES): Vit D, 25-Hydroxy: 43.4 ng/mL (ref 30.0–100.0)

## 2023-10-04 ENCOUNTER — Encounter: Payer: Self-pay | Admitting: Family Medicine

## 2023-10-08 ENCOUNTER — Ambulatory Visit (INDEPENDENT_AMBULATORY_CARE_PROVIDER_SITE_OTHER): Payer: Medicare Other | Admitting: Family Medicine

## 2023-10-08 ENCOUNTER — Encounter: Payer: Self-pay | Admitting: Family Medicine

## 2023-10-08 VITALS — BP 117/73 | HR 87 | Ht 68.0 in | Wt 208.0 lb

## 2023-10-08 DIAGNOSIS — E785 Hyperlipidemia, unspecified: Secondary | ICD-10-CM

## 2023-10-08 DIAGNOSIS — N1831 Chronic kidney disease, stage 3a: Secondary | ICD-10-CM | POA: Diagnosis not present

## 2023-10-08 DIAGNOSIS — R7989 Other specified abnormal findings of blood chemistry: Secondary | ICD-10-CM | POA: Diagnosis not present

## 2023-10-08 DIAGNOSIS — I10 Essential (primary) hypertension: Secondary | ICD-10-CM

## 2023-10-08 DIAGNOSIS — E1159 Type 2 diabetes mellitus with other circulatory complications: Secondary | ICD-10-CM

## 2023-10-08 DIAGNOSIS — E1169 Type 2 diabetes mellitus with other specified complication: Secondary | ICD-10-CM

## 2023-10-08 DIAGNOSIS — E66811 Obesity, class 1: Secondary | ICD-10-CM

## 2023-10-08 DIAGNOSIS — E6609 Other obesity due to excess calories: Secondary | ICD-10-CM

## 2023-10-08 DIAGNOSIS — Z6831 Body mass index (BMI) 31.0-31.9, adult: Secondary | ICD-10-CM

## 2023-10-08 MED ORDER — LANCETS MISC. MISC: 1.0000 | Freq: Three times a day (TID) | 0 refills | Status: AC

## 2023-10-08 MED ORDER — BLOOD GLUCOSE TEST VI STRP: 1.0000 | ORAL_STRIP | Freq: Three times a day (TID) | 0 refills | Status: AC

## 2023-10-08 MED ORDER — LANCET DEVICE MISC: 1.0000 | Freq: Three times a day (TID) | 0 refills | Status: AC

## 2023-10-08 MED ORDER — BLOOD GLUCOSE MONITORING SUPPL DEVI: 1.0000 | Freq: Three times a day (TID) | 0 refills | Status: AC

## 2023-10-08 NOTE — Assessment & Plan Note (Signed)
Needs additonal labs and [place]

## 2023-10-08 NOTE — Patient Instructions (Addendum)
F/u in 6 weeks with diabetic log  Test once daily before breakfast and record, range is 80 to 130  US thyroid asap, before starting additional medication, ozempic or mounjaro for diabetes  Free T3 and Free T4 and urine aCR today  You are referred to diabetic educator for individual counselling, also plan to attend group sessions which re free   Nurse pls order new meter and testing supplies for once daily testing, current meter is old and not functioning per pt report  Covid booster ( 2nd for the season) is due, please get this at the pharmacy  It is important that you exercise regularly at least 30 minutes 5 times a week. If you develop chest pain, have severe difficulty breathing, or feel very tired, stop exercising immediately and seek medical attention  Thanks for choosing [facility], we consider it a privelige to serve you.

## 2023-10-10 ENCOUNTER — Encounter: Payer: Self-pay | Admitting: Family Medicine

## 2023-10-10 LAB — MICROALBUMIN / CREATININE URINE RATIO
Creatinine, Urine: 69.8 mg/dL
Microalb/Creat Ratio: 7 mg/g{creat} (ref 0–29)
Microalbumin, Urine: 4.7 ug/mL

## 2023-10-10 LAB — T3, FREE: T3, Free: 2.7 pg/mL (ref 2.0–4.4)

## 2023-10-10 LAB — T4, FREE: Free T4: 1.09 ng/dL (ref 0.82–1.77)

## 2023-10-12 ENCOUNTER — Ambulatory Visit (HOSPITAL_COMMUNITY)
Admission: RE | Admit: 2023-10-12 | Discharge: 2023-10-12 | Disposition: A | Discharge status: Home / Self Care | Payer: Medicare Other | Source: Ambulatory Visit | Attending: Family Medicine | Admitting: Family Medicine

## 2023-10-12 DIAGNOSIS — R7989 Other specified abnormal findings of blood chemistry: Secondary | ICD-10-CM | POA: Insufficient documentation

## 2023-10-12 DIAGNOSIS — E041 Nontoxic single thyroid nodule: Secondary | ICD-10-CM | POA: Diagnosis not present

## 2023-10-13 ENCOUNTER — Other Ambulatory Visit: Payer: Self-pay | Admitting: Family Medicine

## 2023-10-15 ENCOUNTER — Telehealth: Payer: Self-pay | Admitting: Family Medicine

## 2023-10-15 NOTE — Telephone Encounter (Signed)
Copied from CRM [id number]. Topic: Referral - Status >> Oct 15, 2023 10:56 AM Dennison Nancy wrote: Reason for CRM: Patient checking on status of getting a referral for a diabetic specialist  Syliva Overman suppose to be referral patient for Diabetic specialist

## 2023-10-15 NOTE — Telephone Encounter (Unsigned)
Copied from CRM [id number]. Topic: General - Other >> Oct 15, 2023  5:24 PM Eunice Blase wrote: Reason for CRM: Receive call from pt stated that they discussed Boyd Kerbs as the nutritionist but referral# [id number] [place], [facility] D.Nutrition. Please call pt [phone]

## 2023-10-15 NOTE — Telephone Encounter (Signed)
Referral to diabetic educator entered

## 2023-10-17 ENCOUNTER — Other Ambulatory Visit: Payer: Self-pay | Admitting: Family Medicine

## 2023-10-17 ENCOUNTER — Encounter: Payer: Self-pay | Admitting: Family Medicine

## 2023-10-27 ENCOUNTER — Encounter: Payer: Self-pay | Admitting: Nutrition

## 2023-10-27 ENCOUNTER — Encounter: Payer: Medicare Other | Attending: Family Medicine | Admitting: Nutrition

## 2023-10-27 VITALS — Ht 68.0 in | Wt 209.0 lb

## 2023-10-27 DIAGNOSIS — E1159 Type 2 diabetes mellitus with other circulatory complications: Secondary | ICD-10-CM | POA: Diagnosis not present

## 2023-10-27 DIAGNOSIS — I10 Essential (primary) hypertension: Secondary | ICD-10-CM | POA: Insufficient documentation

## 2023-10-27 NOTE — Progress Notes (Signed)
Medical Nutrition Therapy  Appointment Start time:  1257  Appointment End time:  1400  Primary concerns today: Type 2 Diabetes  Referral diagnosis: E11.8 Preferred learning style: No Preference  Learning readiness: Ready   NUTRITION ASSESSMENT  78 yr old bfemale referred for Type 2 DM. A1C jumped from 6.1 to 9.4% and she has no idea why. She has been skipping lunch usually. Hasn't been eating any sweets or sodas to make it go up that high.  She is going to [facility] daily and riding exercise bike for an hour. FBS 108-118 MG/DL. Currently on Jardiance. TSH elevated 5.130  She is wanting to work with Lifestyle Medicine and focus on a whole plant based lifestyle to reverse her Dm and her chronic diseases.   Clinical Medical Hx:  Past Medical History:  Diagnosis Date   Allergy    seasonal allergies   Anxiety    Arthritis    Depression, major, single episode, moderate (HCC) 07/01/2015   Diabetes mellitus    Glaucoma    Hyperlipidemia    Hypertension    Osteopenia     Medications:  Current Outpatient Medications on File Prior to Visit  Medication Sig Dispense Refill   amLODipine (NORVASC) 10 MG tablet TAKE 1 TABLET BY MOUTH EVERY DAY 90 tablet 1   atorvastatin (LIPITOR) 40 MG tablet Take 1 tablet (40 mg total) by mouth daily. 90 tablet 3   brimonidine (ALPHAGAN) 0.2 % ophthalmic solution Place 1 drop into both eyes in the morning and at bedtime.     Cholecalciferol (VITAMIN D) 50 MCG (2000 UT) tablet Take 1 tablet (2,000 Units total) by mouth daily.     [facility] B-12 500 MCG tablet TAKE 1 TABLET BY MOUTH DAILY 100 tablet 2   dorzolamide-timolol (COSOPT) 22.3-6.8 MG/ML ophthalmic solution Place 1 drop into both eyes 2 times daily.     empagliflozin (JARDIANCE) 25 MG TABS tablet Take 1 tablet (25 mg total) by mouth daily before breakfast. 30 tablet 5   escitalopram (LEXAPRO) 20 MG tablet TAKE 1 TABLET BY MOUTH EVERY DAY 90 tablet 2   fluticasone (FLONASE) 50 MCG/ACT nasal spray Place 1  spray into both nostrils daily. 16 g 1   linaclotide (LINZESS) 72 MCG capsule Take 1 capsule (72 mcg total) by mouth daily before breakfast. 90 capsule 3   montelukast (SINGULAIR) 10 MG tablet TAKE 1 TABLET BY MOUTH AT  BEDTIME 100 tablet 2   valsartan-hydrochlorothiazide (DIOVAN-HCT) 160-25 MG tablet TAKE 1 TABLET BY MOUTH DAILY 100 tablet 2   acetaminophen (TYLENOL) 500 MG tablet Take 500 mg by mouth 2 (two) times daily as needed.     Blood Glucose Monitoring Suppl DEVI 1 each by Does not apply route in the morning, at noon, and at bedtime. May substitute to any manufacturer covered by patient's insurance. 1 each 0   Glucose Blood (BLOOD GLUCOSE TEST STRIPS) STRP 1 each by In Vitro route in the morning, at noon, and at bedtime. May substitute to any manufacturer covered by patient's insurance. 100 strip 0   glucose blood (ONETOUCH ULTRA) test strip USE AS DIRECTED TO MONITOR FASTING BLOOD SUGAR ONCE DAILY 100 strip 3   Lancet Device MISC 1 each by Does not apply route in the morning, at noon, and at bedtime. May substitute to any manufacturer covered by patient's insurance. 1 each 0   Lancets Misc. MISC 1 each by Does not apply route in the morning, at noon, and at bedtime. May substitute to any manufacturer covered  by patient's insurance. 100 each 0   Lancets MISC Please dispense based on patient and insurance preference. Use as directed to monitor FSBS 1x daily. Dx: E11.9. 100 each 1   No current facility-administered medications on file prior to visit.    Labs:  Lab Results  Component Value Date   HGBA1C 9.4 (H) 10/01/2023      Latest Ref Rng & Units 10/01/2023    1:06 PM 05/21/2023   10:46 AM 04/23/2023   11:04 AM  CMP  Glucose 70 - 99 mg/dL 161  096  96   BUN 8 - 27 mg/dL 18  13  12    Creatinine 0.57 - 1.00 mg/dL 0.45  4.09  8.11   Sodium 134 - 144 mmol/L 138  137  141   Potassium 3.5 - 5.2 mmol/L 4.7  3.3  3.8   Chloride 96 - 106 mmol/L 97  104  105   CO2 20 - 29 mmol/L 25  25  21     Calcium 8.7 - 10.3 mg/dL 9.8  91.4  78.2   Total Protein 6.0 - 8.5 g/dL 7.1  8.0  7.3   Total Bilirubin 0.0 - 1.2 mg/dL 1.1  0.9  0.4   Alkaline Phos 44 - 121 IU/L 118  76  86   AST 0 - 40 IU/L 15  17  19    ALT 0 - 32 IU/L 31  15  12      Notable Signs/Symptoms: None  Lifestyle & Dietary Hx Lives by herself.  Estimated daily fluid intake: 60 oz Supplements: Vit D Sleep: 6-7 hrs Stress / self-care: None Current average weekly physical activity: Going to the [facility] and rides exercise bike 4 times per week.   24-Hr Dietary Recall First Meal: 1 cup coffee, Oatmeal, cinnamon bread Snack: 3 grapes Second Meal: skip , e grapes and blueberries Snack: water Third Meal: Steak, toss salad, sweet potato, water Snack: water Beverages: water  Estimated Energy Needs Calories: 1500 Carbohydrate: 170g Protein: 112g Fat: 42g   NUTRITION DIAGNOSIS  NB-1.1 Food and nutrition-related knowledge deficit As related to Diabetes Type 2.  As evidenced by A1C 9.4%.   NUTRITION INTERVENTION  Nutrition education (E-1) on the following topics:  Nutrition and Diabetes education provided on My Plate, CHO counting, meal planning, portion sizes, timing of meals, avoiding snacks between meals unless having a low blood sugar, target ranges for A1C and blood sugars, signs/symptoms and treatment of hyper/hypoglycemia, monitoring blood sugars, taking medications as prescribed, benefits of exercising 30 minutes per day and prevention of complications of DM.  Lifestyle Medicine  - Whole Food, Plant Predominant Nutrition is highly recommended: Eat Plenty of vegetables, Mushrooms, fruits, Legumes, Whole Grains, Nuts, seeds in lieu of processed meats, processed snacks/pastries red meat, poultry, eggs.    -It is better to avoid simple carbohydrates including: Cakes, Sweet Desserts, Ice Cream, Soda (diet and regular), Sweet Tea, Candies, Chips, Cookies, Store Bought Juices, Alcohol in Excess of  1-2 drinks a day,  Lemonade,  Artificial Sweeteners, Doughnuts, Coffee Creamers, "Sugar-free" Products, etc, etc.  This is not a complete list.....  Exercise: If you are able: 30 -60 minutes a day ,4 days a week, or 150 minutes a week.  The longer the better.  Combine stretch, strength, and aerobic activities.  If you were told in the past that you have high risk for cardiovascular diseases, you may seek evaluation by your heart doctor prior to initiating moderate to intense exercise programs.   Handouts Provided Include  Lifestyle Medicine handouts Know your numbers   Learning Style & Readiness for Change Teaching method utilized: Visual & Auditory  Demonstrated degree of understanding via: Teach Back  Barriers to learning/adherence to lifestyle change: None  Goals Established by Pt Goals  Eat three meals per day Get A1C down to 6% Increase fruits and vegetables Exercise 4-5 times per week Cut out processed foods Test blood sugars twice a day Drink 6 bottles of water per day   MONITORING & EVALUATION Dietary intake, weekly physical activity, and Diabetes Type 2 .  Next Steps  Patient is to work on eating more consistent meals.[name] Kitchen

## 2023-10-27 NOTE — Patient Instructions (Signed)
Goals  Eat three meals per day Get A1C down to 6% Increase fruits and vegetables Exercise 4-5 times per week Cut out processed foods Test blood sugars twice a day Drink 6 bottles of water per day

## 2023-10-31 ENCOUNTER — Other Ambulatory Visit: Payer: Self-pay | Admitting: Family Medicine

## 2023-11-02 ENCOUNTER — Encounter: Payer: Self-pay | Admitting: Family Medicine

## 2023-11-02 DIAGNOSIS — N183 Chronic kidney disease, stage 3 unspecified: Secondary | ICD-10-CM | POA: Insufficient documentation

## 2023-11-02 NOTE — Progress Notes (Signed)
 Victoria Mcconnell     MRN: [id number]      DOB: 03/10/1946  Chief Complaint  Patient presents with   Follow-up    Follow up blood sugar    HPI Victoria Mcconnell is here for specifically to address uncontrolled diabetes, which is new for her and her meter which she has relied on is clearly not functioning andold Denies polyuria, polydipsia, blurred vision , or hypoglycemic episodes.  Extremely motivated to getting blood sugar controlled Other chronic conditions are revieed and controlled well ROS Denies recent fever or chills. Denies sinus pressure, nasal congestion, ear pain or sore throat. Denies chest congestion, productive cough or wheezing. Denies chest pains, palpitations and leg swelling Denies abdominal pain, nausea, vomiting,diarrhea or constipation.   Denies dysuria, frequency, hesitancy or incontinence. Denies uncontrolled  joint pain, swelling and limitation in mobility. Denies headaches, seizures, numbness, or tingling. Denies depression, has increased  anxiety currently because of herdiabetesDenies skin break down or rash.   PE  BP 117/73 (BP Location: Right Arm, Patient Position: Sitting, Cuff Size: Large)   Pulse 87   Ht 5\' 8"  (1.727 m)   Wt 208 lb (94.3 kg)   SpO2 96%   BMI 31.63 kg/m   Patient alert and oriented and in no cardiopulmonary distress.  HEENT: No facial asymmetry, EOMI,     Neck supple .  Chest: Clear to auscultation bilaterally.  CVS: S1, S2 no murmurs, no S3.Regular rate.  ABD: Soft non tender.   Ext: No edema  MS: Decreased  ROM spine, shoulders, hips and knees.  Skin: Intact, no ulcerations or rash noted.  Psych: Good eye contact, normal affect. Memory intact not anxious or depressed appearing.  CNS: CN 2-12 intact, power,  normal throughout.no focal deficits noted.   Assessment & Plan  High serum thyroid stimulating hormone (TSH) Needs additonal labs and [place]  Type 2 diabetes mellitus with other specified complication  (HCC) Diabetes associated with hypertension, hyperlipidemia, and obesity  [name] is reminded of the importance of commitment to daily physical activity for 30 minutes or more, as able and the need to limit carbohydrate intake to 30 to 60 grams per meal to help with blood sugar control.   The need to take medication as prescribed, test blood sugar as directed, and to call between visits if there is a concern that blood sugar is uncontrolled is also discussed.   [name] is reminded of the importance of daily foot exam, annual eye examination, and good blood sugar, blood pressure and cholesterol control.     Latest Ref Rng & Units 10/08/2023   10:08 AM 10/01/2023    1:06 PM 05/21/2023   10:46 AM 04/23/2023   11:04 AM 03/17/2023   11:06 AM  Diabetic Labs  HbA1c 4.8 - 5.6 %  9.4   6.1    Micro/Creat Ratio 0 - 29 mg/g creat 7       Chol 100 - 199 mg/dL  782   956    HDL >21 mg/dL  42   83    Calc LDL 0 - 99 mg/dL  63   70    Triglycerides 0 - 149 mg/dL  308   86    Creatinine 0.57 - 1.00 mg/dL  6.57  8.46  9.62  9.52       10/27/2023   12:56 PM 10/08/2023    8:54 AM 08/16/2023    8:34 AM 05/27/2023    1:47 PM 04/29/2023    1:53  PM 04/29/2023    1:18 PM 03/24/2023    3:57 PM  BP/Weight  Systolic BP  117  137 128 128 469  Diastolic BP  73  76 72 71 84  Wt. (Lbs) 209 208 204 204  204.12   BMI 31.78 kg/m2 31.63 kg/m2 31.02 kg/m2 31.02 kg/m2  31.04 kg/m2       Latest Ref Rng & Units 03/23/2023   12:00 AM 01/22/2023   10:40 AM  Foot/eye exam completion dates  Eye Exam No Retinopathy No Retinopathy       Foot Form Completion   Done     This result is from an external source.      Uncontrolled, refer diabetic ed, will start once weekly mounjaro, need to check on thyroid function prior to starting Refer diabetic ed, nededs to do once daily testin pre breakfast, highly motivated to have blood sugar controlled  Essential (primary) hypertension DASH diet and commitment to daily physical  activity for a minimum of 30 minutes discussed and encouraged, as a part of hypertension management. The importance of attaining a healthy weight is also discussed.     10/27/2023   12:56 PM 10/08/2023    8:54 AM 08/16/2023    8:34 AM 05/27/2023    1:47 PM 04/29/2023    1:53 PM 04/29/2023    1:18 PM 03/24/2023    3:57 PM  BP/Weight  Systolic BP  117  137 128 128 629  Diastolic BP  73  76 72 71 84  Wt. (Lbs) 209 208 204 204  204.12   BMI 31.78 kg/m2 31.63 kg/m2 31.02 kg/m2 31.02 kg/m2  31.04 kg/m2      Controlled, no change in medication   Hyperlipidemia Hyperlipidemia:Low fat diet discussed and encouraged.   Lipid Panel  Lab Results  Component Value Date   CHOL 126 10/01/2023   HDL 42 10/01/2023   LDLCALC 63 10/01/2023   TRIG 113 10/01/2023   CHOLHDL 3.0 10/01/2023     Controlled, no change in medication   Obesity  Patient re-educated about  the importance of commitment to a  minimum of 150 minutes of exercise per week as able.  The importance of healthy food choices with portion control discussed, as well as eating regularly and within a 12 hour window most days. The need to choose "clean , green" food 50 to 75% of the time is discussed, as well as to make water the primary drink and set a goal of 64 ounces water daily.       10/27/2023   12:56 PM 10/08/2023    8:54 AM 08/16/2023    8:34 AM  Weight /BMI  Weight 209 lb 208 lb 204 lb  Height 5\' 8"  (1.727 m) 5\' 8"  (1.727 m) 5\' 8"  (1.727 m)  BMI 31.78 kg/m2 31.63 kg/m2 31.02 kg/m2    Deteriorate will rx mounjaro once thyroid sorted out  CKD (chronic kidney disease) stage 3, GFR 30-59 ml/min (HCC) Stable and followed by Nephrology

## 2023-11-02 NOTE — Assessment & Plan Note (Signed)
 Diabetes associated with hypertension, hyperlipidemia, and obesity  Victoria Mcconnell is reminded of the importance of commitment to daily physical activity for 30 minutes or more, as able and the need to limit carbohydrate intake to 30 to 60 grams per meal to help with blood sugar control.   The need to take medication as prescribed, test blood sugar as directed, and to call between visits if there is a concern that blood sugar is uncontrolled is also discussed.   Victoria Mcconnell is reminded of the importance of daily foot exam, annual eye examination, and good blood sugar, blood pressure and cholesterol control.     Latest Ref Rng & Units 10/08/2023   10:08 AM 10/01/2023    1:06 PM 05/21/2023   10:46 AM 04/23/2023   11:04 AM 03/17/2023   11:06 AM  Diabetic Labs  HbA1c 4.8 - 5.6 %  9.4   6.1    Micro/Creat Ratio 0 - 29 mg/g creat 7       Chol 100 - 199 mg/dL  098   119    HDL >14 mg/dL  42   83    Calc LDL 0 - 99 mg/dL  63   70    Triglycerides 0 - 149 mg/dL  782   86    Creatinine 0.57 - 1.00 mg/dL  9.56  2.13  0.86  5.78       10/27/2023   12:56 PM 10/08/2023    8:54 AM 08/16/2023    8:34 AM 05/27/2023    1:47 PM 04/29/2023    1:53 PM 04/29/2023    1:18 PM 03/24/2023    3:57 PM  BP/Weight  Systolic BP  117  137 128 128 469  Diastolic BP  73  76 72 71 84  Wt. (Lbs) 209 208 204 204  204.12   BMI 31.78 kg/m2 31.63 kg/m2 31.02 kg/m2 31.02 kg/m2  31.04 kg/m2       Latest Ref Rng & Units 03/23/2023   12:00 AM 01/22/2023   10:40 AM  Foot/eye exam completion dates  Eye Exam No Retinopathy No Retinopathy       Foot Form Completion   Done     This result is from an external source.      Uncontrolled, refer diabetic ed, will start once weekly mounjaro, need to check on thyroid function prior to starting Refer diabetic ed, nededs to do once daily testin pre breakfast, highly motivated to have blood sugar controlled

## 2023-11-02 NOTE — Assessment & Plan Note (Signed)
  Patient re-educated about  the importance of commitment to a  minimum of 150 minutes of exercise per week as able.  The importance of healthy food choices with portion control discussed, as well as eating regularly and within a 12 hour window most days. The need to choose "clean , green" food 50 to 75% of the time is discussed, as well as to make water the primary drink and set a goal of 64 ounces water daily.       10/27/2023   12:56 PM 10/08/2023    8:54 AM 08/16/2023    8:34 AM  Weight /BMI  Weight 209 lb 208 lb 204 lb  Height 5\' 8"  (1.727 m) 5\' 8"  (1.727 m) 5\' 8"  (1.727 m)  BMI 31.78 kg/m2 31.63 kg/m2 31.02 kg/m2    Deteriorate will rx mounjaro once thyroid sorted out

## 2023-11-02 NOTE — Assessment & Plan Note (Signed)
 Hyperlipidemia:Low fat diet discussed and encouraged.   Lipid Panel  Lab Results  Component Value Date   CHOL 126 10/01/2023   HDL 42 10/01/2023   LDLCALC 63 10/01/2023   TRIG 113 10/01/2023   CHOLHDL 3.0 10/01/2023     Controlled, no change in medication

## 2023-11-02 NOTE — Assessment & Plan Note (Signed)
 DASH diet and commitment to daily physical activity for a minimum of 30 minutes discussed and encouraged, as a part of hypertension management. The importance of attaining a healthy weight is also discussed.     10/27/2023   12:56 PM 10/08/2023    8:54 AM 08/16/2023    8:34 AM 05/27/2023    1:47 PM 04/29/2023    1:53 PM 04/29/2023    1:18 PM 03/24/2023    3:57 PM  BP/Weight  Systolic BP  117  137 128 128 161  Diastolic BP  73  76 72 71 84  Wt. (Lbs) 209 208 204 204  204.12   BMI 31.78 kg/m2 31.63 kg/m2 31.02 kg/m2 31.02 kg/m2  31.04 kg/m2      Controlled, no change in medication

## 2023-11-02 NOTE — Assessment & Plan Note (Signed)
 Stable and followed by Nephrology

## 2023-11-03 ENCOUNTER — Other Ambulatory Visit: Payer: Self-pay

## 2023-11-03 MED ORDER — AMLODIPINE BESYLATE 10 MG PO TABS
10.0000 mg | ORAL_TABLET | Freq: Every day | ORAL | 1 refills | Status: DC
Start: 2023-11-03 — End: 2024-01-20

## 2023-11-05 ENCOUNTER — Encounter: Payer: Self-pay | Admitting: Family Medicine

## 2023-11-05 ENCOUNTER — Ambulatory Visit (INDEPENDENT_AMBULATORY_CARE_PROVIDER_SITE_OTHER): Payer: Medicare Other | Admitting: Family Medicine

## 2023-11-05 VITALS — BP 114/76 | HR 84 | Ht 68.0 in | Wt 207.4 lb

## 2023-11-05 DIAGNOSIS — E66811 Obesity, class 1: Secondary | ICD-10-CM

## 2023-11-05 DIAGNOSIS — E1169 Type 2 diabetes mellitus with other specified complication: Secondary | ICD-10-CM | POA: Diagnosis not present

## 2023-11-05 DIAGNOSIS — J3089 Other allergic rhinitis: Secondary | ICD-10-CM | POA: Diagnosis not present

## 2023-11-05 DIAGNOSIS — F329 Major depressive disorder, single episode, unspecified: Secondary | ICD-10-CM

## 2023-11-05 DIAGNOSIS — Z6831 Body mass index (BMI) 31.0-31.9, adult: Secondary | ICD-10-CM

## 2023-11-05 DIAGNOSIS — E785 Hyperlipidemia, unspecified: Secondary | ICD-10-CM | POA: Diagnosis not present

## 2023-11-05 DIAGNOSIS — I10 Essential (primary) hypertension: Secondary | ICD-10-CM

## 2023-11-05 MED ORDER — CHLORPHENIRAMINE MALEATE 4 MG PO TABS: ORAL_TABLET | ORAL | 0 refills | Status: DC

## 2023-11-05 MED ORDER — BENZONATATE 100 MG PO CAPS: 100.0000 mg | ORAL_CAPSULE | Freq: Two times a day (BID) | ORAL | 0 refills | Status: DC | PRN

## 2023-11-05 NOTE — Patient Instructions (Addendum)
 Please reschedule March appointment to first week in May  Please commit to using your Flonase spray and montelukast every day to control allergy symptoms.  I have prescribed Tessalon Perles as decongestants and also recommend chlorpheniramine tablets which are Decon decongestants and over-the-counter.   The chlorpheniramine should be used sparingly just 1 tablet once daily if needed for excessive drainage as they do tend to cause a lot of drying of secretions.  Congratulations on marked improvement and excellent control of your blood sugar.  Continue Jardiance 25 mg once daily.  HbA1c Chem-7 and EGFR to be drawn 3 to 7 days before your May appointment.  Your blood sugar meter is accurate as we have demonstrated in the office today.   It is important that you exercise regularly at least 30 minutes 5 times a week. If you develop chest pain, have severe difficulty breathing, or feel very tired, stop exercising immediately and seek medical attention   Thanks for choosing [facility], we consider it a privelige to serve you.

## 2023-11-08 DIAGNOSIS — J3089 Other allergic rhinitis: Secondary | ICD-10-CM | POA: Insufficient documentation

## 2023-11-08 NOTE — Assessment & Plan Note (Signed)
 Currently uncontrolled , needs to commit to daily use of prescription meds and have sent in for short term, as needed use tessalon perles and recommend chlorpheniramine sparingly also

## 2023-11-08 NOTE — Assessment & Plan Note (Signed)
  Patient re-educated about  the importance of commitment to a  minimum of 150 minutes of exercise per week as able.  The importance of healthy food choices with portion control discussed, as well as eating regularly and within a 12 hour window most days. The need to choose "clean , green" food 50 to 75% of the time is discussed, as well as to make water the primary drink and set a goal of 64 ounces water daily.       11/05/2023    8:10 AM 10/27/2023   12:56 PM 10/08/2023    8:54 AM  Weight /BMI  Weight 207 lb 6.4 oz 209 lb 208 lb  Height 5\' 8"  (1.727 m) 5\' 8"  (1.727 m) 5\' 8"  (1.727 m)  BMI 31.54 kg/m2 31.78 kg/m2 31.63 kg/m2

## 2023-11-08 NOTE — Assessment & Plan Note (Signed)
 Controlled, no change in medication DASH diet and commitment to daily physical activity for a minimum of 30 minutes discussed and encouraged, as a part of hypertension management. The importance of attaining a healthy weight is also discussed.     11/05/2023    8:10 AM 10/27/2023   12:56 PM 10/08/2023    8:54 AM 08/16/2023    8:34 AM 05/27/2023    1:47 PM 04/29/2023    1:53 PM 04/29/2023    1:18 PM  BP/Weight  Systolic BP 114  657  137 128 846  Diastolic BP 76  73  76 72 71  Wt. (Lbs) 207.4 209 208 204 204  204.12  BMI 31.54 kg/m2 31.78 kg/m2 31.63 kg/m2 31.02 kg/m2 31.02 kg/m2  31.04 kg/m2

## 2023-11-08 NOTE — Assessment & Plan Note (Signed)
 Hyperlipidemia:Low fat diet discussed and encouraged.   Lipid Panel  Lab Results  Component Value Date   CHOL 126 10/01/2023   HDL 42 10/01/2023   LDLCALC 63 10/01/2023   TRIG 113 10/01/2023   CHOLHDL 3.0 10/01/2023     Controlled, no change in medication

## 2023-11-08 NOTE — Assessment & Plan Note (Signed)
 Controlled, no change in medication

## 2023-11-08 NOTE — Progress Notes (Signed)
 Victoria Mcconnell     MRN: [id number]      DOB: 1946-04-30  Chief Complaint  Patient presents with   Cough    Cough nasal congestion x 1 week    HPI [name] is here for follow up of uncontrolled diabetes. Has been doing extremely well since attending diabetic ed session, tests twice daily , and FBG generally under 110 Tolerating medication very well Denies polyuria, polydipsia, blurred vision , or hypoglycemic episodes. Currently testing twice saily and evening blood sugar seldom over 140 Denies polyuria, polydipsia, blurred vision , or hypoglycemic episodes. Increased nasal congestion with clear drainagex 1 week, no cough, fever , chills or green drainage 'ROS Denies recent fever or chills.  Denies chest pains, palpitations and leg swelling Denies abdominal pain, nausea, vomiting,diarrhea or constipation.   Denies dysuria, frequency, hesitancy or incontinence. Denies joint pain, swelling and limitation in mobility. Denies headaches, seizures, numbness, or tingling. Denies depression, anxiety or insomnia. Denies skin break down or rash.   PE  BP 114/76 (BP Location: Right Arm, Patient Position: Sitting, Cuff Size: Large)   Pulse 84   Ht 5\' 8"  (1.727 m)   Wt 207 lb 6.4 oz (94.1 kg)   SpO2 98%   BMI 31.54 kg/m   Patient alert and oriented and in no cardiopulmonary distress.  HEENT: No facial asymmetry, EOMI,     Neck supple .no sinus tenderness, positive nasal congestion, oropharynx no erythema or exudate  Chest: Clear to auscultation bilaterally.  CVS: S1, S2 no murmurs, no S3.Regular rate.  ABD: Soft non tender.   Ext: No edema  MS: Adequate ROM spine, shoulders, hips and knees.  Skin: Intact, no ulcerations or rash noted.  Psych: Good eye contact, normal affect. Memory intact not anxious or depressed appearing.  CNS: CN 2-12 intact, power,  normal throughout.no focal deficits noted.   Assessment & Plan  Type 2 diabetes mellitus with other specified  complication (HCC) Diabetes associated with hypertension, hyperlipidemia, and depression  [name] is reminded of the importance of commitment to daily physical activity for 30 minutes or more, as able and the need to limit carbohydrate intake to 30 to 60 grams per meal to help with blood sugar control.   The need to take medication as prescribed, test blood sugar as directed, and to call between visits if there is a concern that blood sugar is uncontrolled is also discussed.   [name] is reminded of the importance of daily foot exam, annual eye examination, and good blood sugar, blood pressure and cholesterol control.     Latest Ref Rng & Units 10/08/2023   10:08 AM 10/01/2023    1:06 PM 05/21/2023   10:46 AM 04/23/2023   11:04 AM 03/17/2023   11:06 AM  Diabetic Labs  HbA1c 4.8 - 5.6 %  9.4   6.1    Micro/Creat Ratio 0 - 29 mg/g creat 7       Chol 100 - 199 mg/dL  409   811    HDL >91 mg/dL  42   83    Calc LDL 0 - 99 mg/dL  63   70    Triglycerides 0 - 149 mg/dL  478   86    Creatinine 0.57 - 1.00 mg/dL  2.95  6.21  3.08  6.57       11/05/2023    8:10 AM 10/27/2023   12:56 PM 10/08/2023    8:54 AM 08/16/2023    8:34 AM 05/27/2023  1:47 PM 04/29/2023    1:53 PM 04/29/2023    1:18 PM  BP/Weight  Systolic BP 114  409  137 128 811  Diastolic BP 76  73  76 72 71  Wt. (Lbs) 207.4 209 208 204 204  204.12  BMI 31.54 kg/m2 31.78 kg/m2 31.63 kg/m2 31.02 kg/m2 31.02 kg/m2  31.04 kg/m2      Latest Ref Rng & Units 03/23/2023   12:00 AM 01/22/2023   10:40 AM  Foot/eye exam completion dates  Eye Exam No Retinopathy No Retinopathy       Foot Form Completion   Done     This result is from an external source.        Hyperlipidemia Hyperlipidemia:Low fat diet discussed and encouraged.   Lipid Panel  Lab Results  Component Value Date   CHOL 126 10/01/2023   HDL 42 10/01/2023   LDLCALC 63 10/01/2023   TRIG 113 10/01/2023   CHOLHDL 3.0 10/01/2023     Controlled, no change in  medication   Essential (primary) hypertension Controlled, no change in medication DASH diet and commitment to daily physical activity for a minimum of 30 minutes discussed and encouraged, as a part of hypertension management. The importance of attaining a healthy weight is also discussed.     11/05/2023    8:10 AM 10/27/2023   12:56 PM 10/08/2023    8:54 AM 08/16/2023    8:34 AM 05/27/2023    1:47 PM 04/29/2023    1:53 PM 04/29/2023    1:18 PM  BP/Weight  Systolic BP 114  914  137 128 782  Diastolic BP 76  73  76 72 71  Wt. (Lbs) 207.4 209 208 204 204  204.12  BMI 31.54 kg/m2 31.78 kg/m2 31.63 kg/m2 31.02 kg/m2 31.02 kg/m2  31.04 kg/m2       Depression Controlled, no change in medication   Obesity  Patient re-educated about  the importance of commitment to a  minimum of 150 minutes of exercise per week as able.  The importance of healthy food choices with portion control discussed, as well as eating regularly and within a 12 hour window most days. The need to choose "clean , green" food 50 to 75% of the time is discussed, as well as to make water the primary drink and set a goal of 64 ounces water daily.       11/05/2023    8:10 AM 10/27/2023   12:56 PM 10/08/2023    8:54 AM  Weight /BMI  Weight 207 lb 6.4 oz 209 lb 208 lb  Height 5\' 8"  (1.727 m) 5\' 8"  (1.727 m) 5\' 8"  (1.727 m)  BMI 31.54 kg/m2 31.78 kg/m2 31.63 kg/m2      Environmental and seasonal allergies Currently uncontrolled , needs to commit to daily use of prescription meds and have sent in for short term, as needed use tessalon perles and recommend chlorpheniramine sparingly also

## 2023-11-08 NOTE — Assessment & Plan Note (Signed)
 Diabetes associated with hypertension, hyperlipidemia, and depression  Victoria Mcconnell is reminded of the importance of commitment to daily physical activity for 30 minutes or more, as able and the need to limit carbohydrate intake to 30 to 60 grams per meal to help with blood sugar control.   The need to take medication as prescribed, test blood sugar as directed, and to call between visits if there is a concern that blood sugar is uncontrolled is also discussed.   Victoria Mcconnell is reminded of the importance of daily foot exam, annual eye examination, and good blood sugar, blood pressure and cholesterol control.     Latest Ref Rng & Units 10/08/2023   10:08 AM 10/01/2023    1:06 PM 05/21/2023   10:46 AM 04/23/2023   11:04 AM 03/17/2023   11:06 AM  Diabetic Labs  HbA1c 4.8 - 5.6 %  9.4   6.1    Micro/Creat Ratio 0 - 29 mg/g creat 7       Chol 100 - 199 mg/dL  604   540    HDL >98 mg/dL  42   83    Calc LDL 0 - 99 mg/dL  63   70    Triglycerides 0 - 149 mg/dL  119   86    Creatinine 0.57 - 1.00 mg/dL  1.47  8.29  5.62  1.30       11/05/2023    8:10 AM 10/27/2023   12:56 PM 10/08/2023    8:54 AM 08/16/2023    8:34 AM 05/27/2023    1:47 PM 04/29/2023    1:53 PM 04/29/2023    1:18 PM  BP/Weight  Systolic BP 114  865  137 128 784  Diastolic BP 76  73  76 72 71  Wt. (Lbs) 207.4 209 208 204 204  204.12  BMI 31.54 kg/m2 31.78 kg/m2 31.63 kg/m2 31.02 kg/m2 31.02 kg/m2  31.04 kg/m2      Latest Ref Rng & Units 03/23/2023   12:00 AM 01/22/2023   10:40 AM  Foot/eye exam completion dates  Eye Exam No Retinopathy No Retinopathy       Foot Form Completion   Done     This result is from an external source.

## 2023-11-15 ENCOUNTER — Ambulatory Visit (INDEPENDENT_AMBULATORY_CARE_PROVIDER_SITE_OTHER): Payer: Medicare Other | Admitting: Podiatry

## 2023-11-15 DIAGNOSIS — E1151 Type 2 diabetes mellitus with diabetic peripheral angiopathy without gangrene: Secondary | ICD-10-CM | POA: Diagnosis not present

## 2023-11-15 DIAGNOSIS — B351 Tinea unguium: Secondary | ICD-10-CM | POA: Diagnosis not present

## 2023-11-15 DIAGNOSIS — M79674 Pain in right toe(s): Secondary | ICD-10-CM

## 2023-11-15 DIAGNOSIS — L84 Corns and callosities: Secondary | ICD-10-CM | POA: Diagnosis not present

## 2023-11-15 DIAGNOSIS — M79675 Pain in left toe(s): Secondary | ICD-10-CM | POA: Diagnosis not present

## 2023-11-15 NOTE — Progress Notes (Signed)
       Subjective:  Patient ID: Victoria Mcconnell, female    DOB: 11-10-1945,  MRN: [id number]  Victoria Mcconnell presents to clinic today for:  Chief Complaint  Patient presents with   dfc    Nail trim   Patient notes nails are thick and elongated, causing pain in shoe gear when ambulating.  She has painful calluses on the plantar heel bilateral.  Her last A1c was over 9  PCP is Kerri Perches, MD. last seen 11/05/2023  Past Medical History:  Diagnosis Date   Allergy    seasonal allergies   Anxiety    Arthritis    Depression, major, single episode, moderate (HCC) 07/01/2015   Diabetes mellitus    Glaucoma    Hyperlipidemia    Hypertension    Osteopenia     No Known Allergies  Objective:  Victoria Mcconnell is a pleasant 78 y.o. female in NAD. AAO x 3.  Vascular Examination: Patient has palpable DP pulse, absent PT pulse bilateral.  Delayed capillary refill bilateral toes.  Sparse digital hair bilateral.  Proximal to distal cooling WNL bilateral.    Dermatological Examination: Interspaces are clear with no open lesions noted bilateral.  Skin is shiny and atrophic bilateral.  Nails are 3-31mm thick, with yellowish/brown discoloration, subungual debris and distal onycholysis x10.  There is pain with compression of nails x10.  There are hyperkeratotic lesions noted bilateral plantar heel.     Latest Ref Rng & Units 10/01/2023    1:06 PM 04/23/2023   11:04 AM 01/18/2023   10:32 AM  Hemoglobin A1C  Hemoglobin-A1c 4.8 - 5.6 % 9.4  6.1  6.1    Patient qualifies for at-risk foot care because of type 2 diabetes with PVD.  Assessment/Plan: 1. Pain due to onychomycosis of toenails of both feet   2. Type 2 diabetes with peripheral circulatory disorder, controlled (HCC)   3. Callus of foot     Mycotic nails x10 were sharply debrided with sterile nail nippers and power debriding burr to decrease bulk and length.  Hyperkeratotic lesions x2 on plantar heels were shaved with #[id number]  blade.   Return in about 3 months (around 02/15/2024) for [facility].   Clerance Lav, DPM, FACFAS [facility]     [address] [place], [place] [address]                Office [phone]  Fax [phone]

## 2023-11-17 DIAGNOSIS — E1122 Type 2 diabetes mellitus with diabetic chronic kidney disease: Secondary | ICD-10-CM | POA: Diagnosis not present

## 2023-11-17 DIAGNOSIS — E119 Type 2 diabetes mellitus without complications: Secondary | ICD-10-CM | POA: Diagnosis not present

## 2023-11-17 DIAGNOSIS — E785 Hyperlipidemia, unspecified: Secondary | ICD-10-CM | POA: Diagnosis not present

## 2023-11-17 DIAGNOSIS — D472 Monoclonal gammopathy: Secondary | ICD-10-CM | POA: Diagnosis not present

## 2023-11-17 DIAGNOSIS — N1831 Chronic kidney disease, stage 3a: Secondary | ICD-10-CM | POA: Diagnosis not present

## 2023-11-18 LAB — LAB REPORT - SCANNED
Alb/Creat Ratio, Ur: 9 mg/g{creat}
Albumin, Urine POC: 8.9

## 2023-11-22 ENCOUNTER — Other Ambulatory Visit: Payer: Self-pay

## 2023-11-22 DIAGNOSIS — E538 Deficiency of other specified B group vitamins: Secondary | ICD-10-CM

## 2023-11-22 DIAGNOSIS — D472 Monoclonal gammopathy: Secondary | ICD-10-CM

## 2023-11-22 DIAGNOSIS — H401131 Primary open-angle glaucoma, bilateral, mild stage: Secondary | ICD-10-CM | POA: Diagnosis not present

## 2023-11-22 DIAGNOSIS — N183 Chronic kidney disease, stage 3 unspecified: Secondary | ICD-10-CM

## 2023-11-22 DIAGNOSIS — D649 Anemia, unspecified: Secondary | ICD-10-CM

## 2023-11-23 ENCOUNTER — Inpatient Hospital Stay: Payer: Medicare Other | Attending: Hematology

## 2023-11-23 ENCOUNTER — Ambulatory Visit (HOSPITAL_COMMUNITY)
Admission: RE | Admit: 2023-11-23 | Discharge: 2023-11-23 | Disposition: A | Discharge status: Home / Self Care | Source: Ambulatory Visit | Attending: Oncology | Admitting: Oncology

## 2023-11-23 DIAGNOSIS — E876 Hypokalemia: Secondary | ICD-10-CM | POA: Insufficient documentation

## 2023-11-23 DIAGNOSIS — M47812 Spondylosis without myelopathy or radiculopathy, cervical region: Secondary | ICD-10-CM | POA: Diagnosis not present

## 2023-11-23 DIAGNOSIS — D631 Anemia in chronic kidney disease: Secondary | ICD-10-CM

## 2023-11-23 DIAGNOSIS — D649 Anemia, unspecified: Secondary | ICD-10-CM | POA: Diagnosis not present

## 2023-11-23 DIAGNOSIS — N1831 Chronic kidney disease, stage 3a: Secondary | ICD-10-CM | POA: Diagnosis not present

## 2023-11-23 DIAGNOSIS — E1122 Type 2 diabetes mellitus with diabetic chronic kidney disease: Secondary | ICD-10-CM | POA: Insufficient documentation

## 2023-11-23 DIAGNOSIS — E538 Deficiency of other specified B group vitamins: Secondary | ICD-10-CM

## 2023-11-23 DIAGNOSIS — Z87891 Personal history of nicotine dependence: Secondary | ICD-10-CM | POA: Insufficient documentation

## 2023-11-23 DIAGNOSIS — I129 Hypertensive chronic kidney disease with stage 1 through stage 4 chronic kidney disease, or unspecified chronic kidney disease: Secondary | ICD-10-CM | POA: Diagnosis not present

## 2023-11-23 DIAGNOSIS — D472 Monoclonal gammopathy: Secondary | ICD-10-CM | POA: Insufficient documentation

## 2023-11-23 DIAGNOSIS — R7989 Other specified abnormal findings of blood chemistry: Secondary | ICD-10-CM | POA: Diagnosis not present

## 2023-11-23 DIAGNOSIS — M47816 Spondylosis without myelopathy or radiculopathy, lumbar region: Secondary | ICD-10-CM | POA: Diagnosis not present

## 2023-11-23 DIAGNOSIS — Z807 Family history of other malignant neoplasms of lymphoid, hematopoietic and related tissues: Secondary | ICD-10-CM | POA: Insufficient documentation

## 2023-11-23 LAB — CBC WITH DIFFERENTIAL/PLATELET
Abs Immature Granulocytes: 0.03 10*3/uL (ref 0.00–0.07)
Basophils Absolute: 0.1 10*3/uL (ref 0.0–0.1)
Basophils Relative: 1 %
Eosinophils Absolute: 0.2 10*3/uL (ref 0.0–0.5)
Eosinophils Relative: 2 %
HCT: 39.5 % (ref 36.0–46.0)
Hemoglobin: 13 g/dL (ref 12.0–15.0)
Immature Granulocytes: 0 %
Lymphocytes Relative: 16 %
Lymphs Abs: 1.6 10*3/uL (ref 0.7–4.0)
MCH: 29.3 pg (ref 26.0–34.0)
MCHC: 32.9 g/dL (ref 30.0–36.0)
MCV: 89 fL (ref 80.0–100.0)
Monocytes Absolute: 0.7 10*3/uL (ref 0.1–1.0)
Monocytes Relative: 7 %
Neutro Abs: 7.3 10*3/uL (ref 1.7–7.7)
Neutrophils Relative %: 74 %
Platelets: 222 10*3/uL (ref 150–400)
RBC: 4.44 MIL/uL (ref 3.87–5.11)
RDW: 14 % (ref 11.5–15.5)
WBC: 9.7 10*3/uL (ref 4.0–10.5)
nRBC: 0 % (ref 0.0–0.2)

## 2023-11-23 LAB — COMPREHENSIVE METABOLIC PANEL
ALT: 17 U/L (ref 0–44)
AST: 21 U/L (ref 15–41)
Albumin: 4.3 g/dL (ref 3.5–5.0)
Alkaline Phosphatase: 77 U/L (ref 38–126)
Anion gap: 10 (ref 5–15)
BUN: 15 mg/dL (ref 8–23)
CO2: 25 mmol/L (ref 22–32)
Calcium: 10.4 mg/dL — ABNORMAL HIGH (ref 8.9–10.3)
Chloride: 102 mmol/L (ref 98–111)
Creatinine, Ser: 1.11 mg/dL — ABNORMAL HIGH (ref 0.44–1.00)
GFR, Estimated: 52 mL/min — ABNORMAL LOW (ref >60)
Glucose, Bld: 110 mg/dL — ABNORMAL HIGH (ref 70–99)
Potassium: 3 mmol/L — ABNORMAL LOW (ref 3.5–5.1)
Sodium: 137 mmol/L (ref 135–145)
Total Bilirubin: 0.6 mg/dL (ref 0.0–1.2)
Total Protein: 8.4 g/dL — ABNORMAL HIGH (ref 6.5–8.1)

## 2023-11-23 LAB — VITAMIN B12: Vitamin B-12: 871 pg/mL (ref 180–914)

## 2023-11-24 LAB — KAPPA/LAMBDA LIGHT CHAINS
Kappa free light chain: 25.7 mg/L — ABNORMAL HIGH (ref 3.3–19.4)
Kappa, lambda light chain ratio: 0.76 (ref 0.26–1.65)
Lambda free light chains: 34 mg/L — ABNORMAL HIGH (ref 5.7–26.3)

## 2023-11-24 LAB — IMMUNOFIXATION ELECTROPHORESIS
IgA: 121 mg/dL (ref 64–422)
IgG (Immunoglobin G), Serum: 1644 mg/dL — ABNORMAL HIGH (ref 586–1602)
IgM (Immunoglobulin M), Srm: 50 mg/dL (ref 26–217)
Total Protein ELP: 7.5 g/dL (ref 6.0–8.5)

## 2023-11-25 LAB — PROTEIN ELECTROPHORESIS, SERUM
A/G Ratio: 1.1 (ref 0.7–1.7)
Albumin ELP: 4.2 g/dL (ref 2.9–4.4)
Alpha-1-Globulin: 0.3 g/dL (ref 0.0–0.4)
Alpha-2-Globulin: 0.8 g/dL (ref 0.4–1.0)
Beta Globulin: 1.2 g/dL (ref 0.7–1.3)
Gamma Globulin: 1.5 g/dL (ref 0.4–1.8)
Globulin, Total: 3.8 g/dL (ref 2.2–3.9)
M-Spike, %: 0.8 g/dL — ABNORMAL HIGH
Total Protein ELP: 8 g/dL (ref 6.0–8.5)

## 2023-11-25 LAB — METHYLMALONIC ACID, SERUM: Methylmalonic Acid, Quantitative: 178 nmol/L (ref 0–378)

## 2023-11-26 ENCOUNTER — Ambulatory Visit: Payer: Medicare Other | Admitting: Family Medicine

## 2023-12-02 ENCOUNTER — Inpatient Hospital Stay (HOSPITAL_BASED_OUTPATIENT_CLINIC_OR_DEPARTMENT_OTHER): Payer: Medicare Other | Admitting: Oncology

## 2023-12-02 VITALS — BP 119/68 | HR 70 | Temp 97.3°F | Resp 16 | Wt 204.8 lb

## 2023-12-02 DIAGNOSIS — Z807 Family history of other malignant neoplasms of lymphoid, hematopoietic and related tissues: Secondary | ICD-10-CM | POA: Diagnosis not present

## 2023-12-02 DIAGNOSIS — E538 Deficiency of other specified B group vitamins: Secondary | ICD-10-CM | POA: Diagnosis not present

## 2023-12-02 DIAGNOSIS — N183 Chronic kidney disease, stage 3 unspecified: Secondary | ICD-10-CM

## 2023-12-02 DIAGNOSIS — D649 Anemia, unspecified: Secondary | ICD-10-CM

## 2023-12-02 DIAGNOSIS — N1831 Chronic kidney disease, stage 3a: Secondary | ICD-10-CM | POA: Diagnosis not present

## 2023-12-02 DIAGNOSIS — Z87891 Personal history of nicotine dependence: Secondary | ICD-10-CM | POA: Diagnosis not present

## 2023-12-02 DIAGNOSIS — D472 Monoclonal gammopathy: Secondary | ICD-10-CM

## 2023-12-02 DIAGNOSIS — R7989 Other specified abnormal findings of blood chemistry: Secondary | ICD-10-CM | POA: Diagnosis not present

## 2023-12-02 DIAGNOSIS — E876 Hypokalemia: Secondary | ICD-10-CM | POA: Diagnosis not present

## 2023-12-02 DIAGNOSIS — E1122 Type 2 diabetes mellitus with diabetic chronic kidney disease: Secondary | ICD-10-CM | POA: Diagnosis not present

## 2023-12-02 DIAGNOSIS — D631 Anemia in chronic kidney disease: Secondary | ICD-10-CM | POA: Diagnosis not present

## 2023-12-02 DIAGNOSIS — I129 Hypertensive chronic kidney disease with stage 1 through stage 4 chronic kidney disease, or unspecified chronic kidney disease: Secondary | ICD-10-CM | POA: Diagnosis not present

## 2023-12-02 MED ORDER — POTASSIUM CHLORIDE CRYS ER 20 MEQ PO TBCR: 20.0000 meq | EXTENDED_RELEASE_TABLET | Freq: Every day | ORAL | 0 refills | Status: DC

## 2023-12-02 NOTE — Progress Notes (Signed)
 [facility] [address], [place] [address]   CLINIC:  Medical Oncology/Hematology  PCP:  Kerri Perches, MD [address] [place] [address] [phone]   REASON FOR VISIT:  Follow-up for IgG lambda MGUS   PRIOR THERAPY: None  CURRENT THERAPY: Surveillance  INTERVAL HISTORY:   Victoria Mcconnell 78 y.o. female returns for routine follow-up of MGUS.  She was last seen in clinic by me on 05/27/2023.  In the interim, she denies any hospitalizations, surgeries or changes in her baseline health.  She was evaluated by [facility] for onychomycosis of toenails.   She had a bone survey on 11/23/2023 which did not reveal any suspicious lytic or destructive bone lesions.  Degenerative changes in the spine and shoulder.  She had a ultrasound of her thyroid due to abnormal TSH and MGUS on 10/12/2023 which showed a 1.2 cm left inferior thyroid TR 3 nodule which did not meet criteria for biopsy.  There was no other significant finding.  She had a mammogram on 08/18/2023 which was read as BI-RADS Category 1 negative.  Today, she reports doing well.  Energy and appetite is 100%.  Denies any pain.  She is taking 500 mcg B12 supplements daily.  Denies any B symptoms including unintentional weight loss, fatigue, drenching night sweats or change in appetite.  Denies any bone pain.   ASSESSMENT & PLAN:  1.  MGUS, IgG lambda - Seen at the request of her nephrologist (Dr. Bufford Buttner) for work-up of possible MGUS. - Laboratory work-up sent by her nephrologist (10/14/2021) shows M spike of 1.0 and immunofixation showing IgG lambda monoclonal protein.  Kappa light chains 27.4 with lambda light chains 32.1 and ratio 0.85. - 24-hour urine with UPEP/U IFE was negative.  Normal beta-2 microglobulin.  Normal LDH. - Most recent skeletal survey (11/19/2022) was negative for any suspicious osseous lesions - She denies any new bone pain, B symptoms, or  neurologic changes. - Patient's sister has multiple myeloma in remission after stem cell transplant - Patient meets diagnostic criteria for MGUS only.  No smoldering myeloma or multiple myeloma at this time. -Labs from 11/23/2023 show an M spike of 0.8 (0.8).  CBC is unremarkable.  Lambda free light chain are both elevated with a normal kappa lambda light chain ratio.  Fixation shows IgG monoclonal protein with lambda light chain specificity.  She has a potassium of 3.0 and elevated creatinine of 1.11.  Calcium is up with a total protein of 8.4. -There is no concern for progression to myeloma at this time.  No indication for treatment of bone marrow biopsy.  We will continue to monitor every 6 months. -Repeat bone survey annually-next due in March 2026.  2.  Normocytic anemia - Hemoglobin ranged from 10.9-normal over the past 5 years. -Anemia likely secondary to B12 deficiency (resolved) and CKD. -Hemoglobin from 11/23/23 is 13.0. Most recent creatinine is 1.11.  - Hematology workup (11/19/2022): Ferritin 130, iron saturation 24%.  Normal folate, LDH, copper. - B12 level from 11/23/23 is 871 and MMA is 178. -Continue B12 supplements. -Will recheck labs in 6 months.  3.  Hypokalemia: -Lab work from 11/23/2023 shows potassium level of 3.0. -Likely secondary to blood pressure medication that contains a diuretic.   -Reports intermittently low potassium levels over the years. -She is currently not taking potassium supplements. -Recommend 20 mill equivalents daily until she sees her PCP in May.  States she will have her  PCP check her potassium level in May.  4.  Elevated calcium level: -Reports intermittently elevated calcium levels over the years. -Calcium level 10.4 on 11/23/2023.  -Denies excessive use of Tums or consumption of calcium in her diet. -She also thinks it may have something to do with an over-the-counter allergy/cold medication she was taking when labs were drawn called  chlorphentermine.   5.  Other history - PMH: CKD stage IIIa, hypertension, type 2 diabetes mellitus - SOCIAL: She is retired.  She is a former smoker, who smokes cigarettes x5 years but quit 40+ years ago.  She denies any alcohol or illicit drugs. - FAMILY: Patient's sister had multiple myeloma, underwent SCT but relapsed and is now on maintenance medications.  Patient's father had prostate cancer.  PLAN SUMMARY: >> Continue B12 supplements. >> Return to clinic in 6 months with labs a few days before (CBC with differential, CMP, B12, MMA, protein electrophoresis, immunofixation and light chains).      REVIEW OF SYSTEMS:   Review of Systems  Psychiatric/Behavioral:  Negative for depression.      PHYSICAL EXAM:  ECOG PERFORMANCE STATUS: 0 - Asymptomatic  Vitals:   12/02/23 1004  BP: 119/68  Pulse: 70  Resp: 16  Temp: (!) 97.3 F (36.3 C)  SpO2: 100%   Filed Weights   12/02/23 1004  Weight: 204 lb 12.9 oz (92.9 kg)   Physical Exam Constitutional:      Appearance: Normal appearance.  Cardiovascular:     Rate and Rhythm: Normal rate and regular rhythm.  Pulmonary:     Effort: Pulmonary effort is normal.     Breath sounds: Normal breath sounds.  Abdominal:     General: Bowel sounds are normal.     Palpations: Abdomen is soft.  Musculoskeletal:        General: No swelling. Normal range of motion.  Neurological:     Mental Status: She is alert and oriented to person, place, and time. Mental status is at baseline.     PAST MEDICAL/SURGICAL HISTORY:  Past Medical History:  Diagnosis Date   Allergy    seasonal allergies   Anxiety    Arthritis    Depression, major, single episode, moderate (HCC) 07/01/2015   Diabetes mellitus    Glaucoma    Hyperlipidemia    Hypertension    Osteopenia    Past Surgical History:  Procedure Laterality Date   ABDOMINAL HYSTERECTOMY     SPINE SURGERY N/A 11/11/2020   lam facetectomy & foramotomy 1 vrt sgm lumbar    SOCIAL  HISTORY:  Social History   Socioeconomic History   Marital status: Widowed    Spouse name: Not on file   Number of children: Not on file   Years of education: Not on file   Highest education level: Bachelor's degree (e.g., BA, AB, BS)  Occupational History   Not on file  Tobacco Use   Smoking status: Former    Current packs/day: 0.00    Types: Cigarettes    Quit date: 09/08/1981    Years since quitting: 42.2   Smokeless tobacco: Never  Substance and Sexual Activity   Alcohol use: No   Drug use: No   Sexual activity: Not Currently  Other Topics Concern   Not on file  Social History Narrative   Not on file   Social Drivers of Health   Financial Resource Strain: Low Risk  (10/04/2023)   Overall Financial Resource Strain (CARDIA)    Difficulty of Paying Living Expenses:  Not hard at all  Food Insecurity: No Food Insecurity (10/04/2023)   Hunger Vital Sign    Worried About Running Out of Food in the Last Year: Never true    Ran Out of Food in the Last Year: Never true  Transportation Needs: No Transportation Needs (10/04/2023)   PRAPARE - Administrator, Civil Service (Medical): No    Lack of Transportation (Non-Medical): No  Physical Activity: Sufficiently Active (10/04/2023)   Exercise Vital Sign    Days of Exercise per Week: 4 days    Minutes of Exercise per Session: 60 min  Stress: No Stress Concern Present (10/04/2023)   [employer] of Occupational Health - Occupational Stress Questionnaire    Feeling of Stress : Not at all  Social Connections: Moderately Integrated (10/04/2023)   Social Connection and Isolation Panel [NHANES]    Frequency of Communication with Friends and Family: More than three times a week    Frequency of Social Gatherings with Friends and Family: Once a week    Attends Religious Services: More than 4 times per year    Active Member of [employer] or Organizations: Yes    Attends Banker Meetings: More than 4 times per year     Marital Status: Widowed  Catering manager Violence: Not on file    FAMILY HISTORY:  Family History  Problem Relation Age of Onset   Arthritis Mother    Cancer Sister    Diabetes Sister    Hyperlipidemia Sister    Hypertension Sister     CURRENT MEDICATIONS:  Outpatient Encounter Medications as of 12/02/2023  Medication Sig   acetaminophen (TYLENOL) 500 MG tablet Take 500 mg by mouth 2 (two) times daily as needed.   amLODipine (NORVASC) 10 MG tablet Take 1 tablet (10 mg total) by mouth daily.   atorvastatin (LIPITOR) 40 MG tablet Take 1 tablet (40 mg total) by mouth daily.   Blood Glucose Monitoring Suppl DEVI 1 each by Does not apply route in the morning, at noon, and at bedtime. May substitute to any manufacturer covered by patient's insurance.   brimonidine (ALPHAGAN) 0.2 % ophthalmic solution Place 1 drop into both eyes in the morning and at bedtime.   chlorpheniramine (CHLOR-TRIMETON) 4 MG tablet Take one tablet by mouth once daily, as needed, for excess congestion and drainage   Cholecalciferol (VITAMIN D) 50 MCG (2000 UT) tablet Take 1 tablet (2,000 Units total) by mouth daily.   [employer] B-12 500 MCG tablet TAKE 1 TABLET BY MOUTH DAILY   dorzolamide-timolol (COSOPT) 22.3-6.8 MG/ML ophthalmic solution Place 1 drop into both eyes 2 times daily.   empagliflozin (JARDIANCE) 25 MG TABS tablet Take 1 tablet (25 mg total) by mouth daily before breakfast.   escitalopram (LEXAPRO) 20 MG tablet TAKE 1 TABLET BY MOUTH EVERY DAY   fluticasone (FLONASE) 50 MCG/ACT nasal spray Place 1 spray into both nostrils daily.   glucose blood (ONETOUCH ULTRA) test strip USE AS DIRECTED TO MONITOR FASTING BLOOD SUGAR ONCE DAILY   Lancet Devices (ONETOUCH DELICA PLUS LANCING) MISC    Lancets MISC Please dispense based on patient and insurance preference. Use as directed to monitor FSBS 1x daily. Dx: E11.9.   linaclotide (LINZESS) 72 MCG capsule Take 1 capsule (72 mcg total) by mouth daily before breakfast.    montelukast (SINGULAIR) 10 MG tablet TAKE 1 TABLET BY MOUTH AT  BEDTIME   valsartan-hydrochlorothiazide (DIOVAN-HCT) 160-25 MG tablet TAKE 1 TABLET BY MOUTH DAILY   [DISCONTINUED] benzonatate (TESSALON)  100 MG capsule Take 1 capsule (100 mg total) by mouth 2 (two) times daily as needed for cough.   No facility-administered encounter medications on file as of 12/02/2023.    ALLERGIES:  No Known Allergies  LABORATORY DATA:  I have reviewed the labs as listed.  CBC    Component Value Date/Time   WBC 9.7 11/23/2023 0956   RBC 4.44 11/23/2023 0956   HGB 13.0 11/23/2023 0956   HGB 13.0 10/01/2023 1306   HCT 39.5 11/23/2023 0956   HCT 40.1 10/01/2023 1306   PLT 222 11/23/2023 0956   PLT 148 (L) 10/01/2023 1306   MCV 89.0 11/23/2023 0956   MCV 86 10/01/2023 1306   MCH 29.3 11/23/2023 0956   MCHC 32.9 11/23/2023 0956   RDW 14.0 11/23/2023 0956   RDW 14.8 10/01/2023 1306   LYMPHSABS 1.6 11/23/2023 0956   MONOABS 0.7 11/23/2023 0956   EOSABS 0.2 11/23/2023 0956   BASOSABS 0.1 11/23/2023 0956      Latest Ref Rng & Units 11/23/2023    9:56 AM 10/01/2023    1:06 PM 05/21/2023   10:46 AM  CMP  Glucose 70 - 99 mg/dL 829  562  130   BUN 8 - 23 mg/dL 15  18  13    Creatinine 0.44 - 1.00 mg/dL 8.65  7.84  6.96   Sodium 135 - 145 mmol/L 137  138  137   Potassium 3.5 - 5.1 mmol/L 3.0  4.7  3.3   Chloride 98 - 111 mmol/L 102  97  104   CO2 22 - 32 mmol/L 25  25  25    Calcium 8.9 - 10.3 mg/dL 29.5  9.8  28.4   Total Protein 6.5 - 8.1 g/dL 8.4  7.1  8.0   Total Bilirubin 0.0 - 1.2 mg/dL 0.6  1.1  0.9   Alkaline Phos 38 - 126 U/L 77  118  76   AST 15 - 41 U/L 21  15  17    ALT 0 - 44 U/L 17  31  15      DIAGNOSTIC IMAGING:  I have independently reviewed the relevant imaging and discussed with the patient.   WRAP UP:  All questions were answered. The patient knows to call the clinic with any problems, questions or concerns.  Medical decision making: Moderate  Time spent on visit: I  spent 25 minutes dedicated to the care of this patient (face-to-face and non-face-to-face) on the date of the encounter to include what is described in the assessment and plan.  Mauro Kaufmann, NP  12/02/23 10:21 AM

## 2023-12-08 NOTE — Patient Instructions (Signed)
 Visit Information  Thank you for taking time to visit with me today. Please don't hesitate to contact me if I can be of assistance to you.   Following are the goals we discussed today:   Goals Addressed             This Visit's Progress    Home management/improvement of Chronic Kidney Disease, hypertension, diabetes, obtain medication assistance- care coordination services       Patient will have patient assistance forms completed for [employer] Patient will continue to attend all medical appointments   Interventions Today    Flowsheet Row Most Recent Value  Chronic Disease   Chronic disease during today's visit Other  [assisted to find education sent in my chart]  General Interventions   General Interventions Discussed/Reviewed General Interventions Reviewed, [facility]  Education Interventions   Education Provided Provided Education  [assisted to find education sent in my chart]  Provided Verbal Education On Other             Our next appointment is by telephone on 05/13/23 at 3:30 pm  Please call the care guide team at [phone] if you need to cancel or reschedule your appointment.   If you are experiencing a Mental Health or Behavioral Health Crisis or need someone to talk to, please call the Suicide and Crisis Lifeline: 988 call the [place] National Suicide Prevention Lifeline: 330 877 7362 or TTY: 971-474-1897 TTY (959)192-3136) to talk to a trained counselor call 1-800-273-TALK (toll free, 24 hour hotline) call the [facility]: 629 654 3631 call 911   Patient verbalizes understanding of instructions and care plan provided today and agrees to view in MyChart. Active MyChart status and patient understanding of how to access instructions and care plan via MyChart confirmed with patient.     The patient has been provided with contact information for the care management team and has been advised to call with any health related  questions or concerns.   Mareena Cavan L. Noelle Penner, RN, BSN, CCM [facility] RN Care Manager [phone]

## 2023-12-22 ENCOUNTER — Telehealth: Payer: Self-pay

## 2023-12-22 NOTE — Telephone Encounter (Signed)
 Patient was identified as falling into the [place] Measure - Diabetes.   Patient was: Appointment already scheduled for:  01/14/24.

## 2023-12-27 ENCOUNTER — Encounter: Payer: Medicare Other | Attending: Family Medicine | Admitting: Nutrition

## 2023-12-27 VITALS — Ht 68.0 in | Wt 204.0 lb

## 2023-12-27 DIAGNOSIS — E1159 Type 2 diabetes mellitus with other circulatory complications: Secondary | ICD-10-CM | POA: Insufficient documentation

## 2023-12-27 DIAGNOSIS — I10 Essential (primary) hypertension: Secondary | ICD-10-CM | POA: Diagnosis not present

## 2023-12-27 NOTE — Progress Notes (Signed)
 Medical Nutrition Therapy  Appointment Start time:  1140  Appointment End time:  1200  Primary concerns today: Type 2 Diabetes  Referral diagnosis: E11.8 Preferred learning style: No Preference  Learning readiness: Ready   NUTRITION ASSESSMENT  DM Follow up 78 yr old bfemale referred for Type 2 DM. She has worked on eating more fruits and vegetables. Goes to see PCP next month Feels better.   FBS 92-119 and Bedtime 102-116 mg./dl. Still on Jardiance . No UTI His walking or her bike some 3-4 times per week. Lost 3 lbs.   Clinical Medical Hx:  Past Medical History:  Diagnosis Date   Allergy    seasonal allergies   Anxiety    Arthritis    Depression, major, single episode, moderate (HCC) 07/01/2015   Diabetes mellitus    Glaucoma    Hyperlipidemia    Hypertension    Osteopenia     Medications:  Current Outpatient Medications on File Prior to Visit  Medication Sig Dispense Refill   acetaminophen (TYLENOL) 500 MG tablet Take 500 mg by mouth 2 (two) times daily as needed.     amLODipine  (NORVASC ) 10 MG tablet Take 1 tablet (10 mg total) by mouth daily. 90 tablet 1   atorvastatin  (LIPITOR) 40 MG tablet Take 1 tablet (40 mg total) by mouth daily. 90 tablet 3   Blood Glucose Monitoring Suppl DEVI 1 each by Does not apply route in the morning, at noon, and at bedtime. May substitute to any manufacturer covered by patient's insurance. 1 each 0   brimonidine (ALPHAGAN) 0.2 % ophthalmic solution Place 1 drop into both eyes in the morning and at bedtime.     chlorpheniramine  (CHLOR-TRIMETON ) 4 MG tablet Take one tablet by mouth once daily, as needed, for excess congestion and drainage 20 tablet 0   Cholecalciferol (VITAMIN D ) 50 MCG (2000 UT) tablet Take 1 tablet (2,000 Units total) by mouth daily.     [facility] B-12 500 MCG tablet TAKE 1 TABLET BY MOUTH DAILY 100 tablet 2   dorzolamide-timolol (COSOPT) 22.3-6.8 MG/ML ophthalmic solution Place 1 drop into both eyes 2 times daily.      empagliflozin  (JARDIANCE ) 25 MG TABS tablet Take 1 tablet (25 mg total) by mouth daily before breakfast. 30 tablet 5   escitalopram  (LEXAPRO ) 20 MG tablet TAKE 1 TABLET BY MOUTH EVERY DAY 90 tablet 2   fluticasone  (FLONASE ) 50 MCG/ACT nasal spray Place 1 spray into both nostrils daily. 16 g 1   glucose blood (ONETOUCH ULTRA) test strip USE AS DIRECTED TO MONITOR FASTING BLOOD SUGAR ONCE DAILY 100 strip 3   Lancet Devices (ONETOUCH DELICA PLUS LANCING) MISC      Lancets MISC Please dispense based on patient and insurance preference. Use as directed to monitor FSBS 1x daily. Dx: E11.9. 100 each 1   linaclotide  (LINZESS ) 72 MCG capsule Take 1 capsule (72 mcg total) by mouth daily before breakfast. 90 capsule 3   montelukast  (SINGULAIR ) 10 MG tablet TAKE 1 TABLET BY MOUTH AT  BEDTIME 100 tablet 2   potassium chloride  SA (KLOR-CON  M) 20 MEQ tablet Take 1 tablet (20 mEq total) by mouth daily. 60 tablet 0   valsartan -hydrochlorothiazide  (DIOVAN -HCT) 160-25 MG tablet TAKE 1 TABLET BY MOUTH DAILY 100 tablet 2   No current facility-administered medications on file prior to visit.    Labs:  Lab Results  Component Value Date   HGBA1C 9.4 (H) 10/01/2023      Latest Ref Rng & Units 11/23/2023  9:56 AM 10/01/2023    1:06 PM 05/21/2023   10:46 AM  CMP  Glucose 70 - 99 mg/dL 119  147  829   BUN 8 - 23 mg/dL 15  18  13    Creatinine 0.44 - 1.00 mg/dL 5.62  1.30  8.65   Sodium 135 - 145 mmol/L 137  138  137   Potassium 3.5 - 5.1 mmol/L 3.0  4.7  3.3   Chloride 98 - 111 mmol/L 102  97  104   CO2 22 - 32 mmol/L 25  25  25    Calcium  8.9 - 10.3 mg/dL 78.4  9.8  69.6   Total Protein 6.5 - 8.1 g/dL 8.4  7.1  8.0   Total Bilirubin 0.0 - 1.2 mg/dL 0.6  1.1  0.9   Alkaline Phos 38 - 126 U/L 77  118  76   AST 15 - 41 U/L 21  15  17    ALT 0 - 44 U/L 17  31  15      Notable Signs/Symptoms: None  Lifestyle & Dietary Hx Lives by herself.  Estimated daily fluid intake: 60 oz Supplements: Vit D Sleep: 6-7  hrs Stress / self-care: None Current average weekly physical activity: Going to the [facility] and rides exercise bike 4 times per week.   24-Hr Dietary Recall First Meal: 1 cup coffee, Oatmeal, cinnamon bread Snack: 3 grapes Second Meal: skip , e grapes and blueberries Snack: water Third Meal: Steak, toss salad, sweet potato, water Snack: water Beverages: water  Estimated Energy Needs Calories: 1500 Carbohydrate: 170g Protein: 112g Fat: 42g   NUTRITION DIAGNOSIS  NB-1.1 Food and nutrition-related knowledge deficit As related to Diabetes Type 2.  As evidenced by A1C 9.4%.   NUTRITION INTERVENTION  Nutrition education (E-1) on the following topics:  Nutrition and Diabetes education provided on My Plate, CHO counting, meal planning, portion sizes, timing of meals, avoiding snacks between meals unless having a low blood sugar, target ranges for A1C and blood sugars, signs/symptoms and treatment of hyper/hypoglycemia, monitoring blood sugars, taking medications as prescribed, benefits of exercising 30 minutes per day and prevention of complications of DM.  Lifestyle Medicine  - Whole Food, Plant Predominant Nutrition is highly recommended: Eat Plenty of vegetables, Mushrooms, fruits, Legumes, Whole Grains, Nuts, seeds in lieu of processed meats, processed snacks/pastries red meat, poultry, eggs.    -It is better to avoid simple carbohydrates including: Cakes, Sweet Desserts, Ice Cream, Soda (diet and regular), Sweet Tea, Candies, Chips, Cookies, Store Bought Juices, Alcohol in Excess of  1-2 drinks a day, Lemonade,  Artificial Sweeteners, Doughnuts, Coffee Creamers, "Sugar-free" Products, etc, etc.  This is not a complete list.....  Exercise: If you are able: 30 -60 minutes a day ,4 days a week, or 150 minutes a week.  The longer the better.  Combine stretch, strength, and aerobic activities.  If you were told in the past that you have high risk for cardiovascular diseases, you may seek  evaluation by your heart doctor prior to initiating moderate to intense exercise programs.   Handouts Provided Include  Lifestyle Medicine handouts Know your numbers   Learning Style & Readiness for Change Teaching method utilized: Visual & Auditory  Demonstrated degree of understanding via: Teach Back  Barriers to learning/adherence to lifestyle change: None  Goals Established by Pt Goals Keep up the great job. Drink only water  Eat foods that come from a garden.   MONITORING & EVALUATION Dietary intake, weekly physical activity, and Diabetes Type  2 .  Next Steps  Patient is to work on eating more consistent meals.Aaron Aas

## 2023-12-27 NOTE — Patient Instructions (Signed)
 Keep up the great job. Drink only water  Eat foods that come from a garden.

## 2024-01-03 ENCOUNTER — Ambulatory Visit
Admission: RE | Admit: 2024-01-03 | Discharge: 2024-01-03 | Disposition: A | Discharge status: Home / Self Care | Source: Ambulatory Visit | Attending: Nurse Practitioner | Admitting: Nurse Practitioner

## 2024-01-03 VITALS — BP 123/71 | HR 84 | Temp 98.0°F | Resp 18

## 2024-01-03 DIAGNOSIS — M5432 Sciatica, left side: Secondary | ICD-10-CM | POA: Diagnosis not present

## 2024-01-03 DIAGNOSIS — M79605 Pain in left leg: Secondary | ICD-10-CM

## 2024-01-03 HISTORY — DX: Chronic kidney disease, unspecified: N18.9

## 2024-01-03 MED ORDER — TIZANIDINE HCL 4 MG PO TABS: 4.0000 mg | ORAL_TABLET | Freq: Three times a day (TID) | ORAL | 0 refills | Status: DC | PRN

## 2024-01-03 NOTE — ED Provider Notes (Signed)
 [facility]    CSN: [id number] Arrival date & time: 01/03/24  1040      History   Chief Complaint Chief Complaint  Patient presents with   Leg Pain    Entered by patient    HPI [name] is a 78 y.o. female.   The history is provided by the patient.   Patient presents with a 1 week history of left-sided hip pain and leg pain.  Patient denies any recent injury, trauma, or falls.  Patient states pain radiates from the left hip down to the left knee.  She denies bruising, swelling, inability to ambulate, or redness.  States that she does experience some numbness and tingling of the affected area.  Patient states she has tried over-the-counter Tylenol, Voltaren, and NSAIDs for her symptoms.  Patient reports prior history of diabetes.  Past Medical History:  Diagnosis Date   Allergy    seasonal allergies   Anxiety    Arthritis    Chronic kidney disease (CKD)    Depression, major, single episode, moderate (HCC) 07/01/2015   Diabetes mellitus    Glaucoma    Hyperlipidemia    Hypertension    Osteopenia     Patient Active Problem List   Diagnosis Date Noted   Environmental and seasonal allergies 11/08/2023   CKD (chronic kidney disease) stage 3, GFR 30-59 ml/min (HCC) 11/02/2023   High serum thyroid  stimulating hormone (TSH) 10/08/2023   Tinea 04/29/2023   Encounter for Medicare annual examination with abnormal findings 03/25/2023   Financial difficulty 03/25/2023   Onychomycosis 01/25/2023   Type 2 diabetes mellitus with other specified complication (HCC) 01/22/2023   Right wrist pain 12/09/2022   Callus of foot 12/09/2022   Hyperpigmentation 11/02/2022   Need for Tdap vaccination 07/23/2022   Colon cancer screening 11/23/2021   Depression 11/23/2021   IgG Lamda MGUS 11/06/2021   Skin lesions 07/09/2021   Obesity 03/29/2021   Right hip pain 03/27/2021   Guaiac positive stools 02/20/2021   Chronic idiopathic constipation 02/20/2021   Family history  of malignant neoplasm of digestive organ 02/20/2021   History of colonic polyps 02/20/2021   Degenerative spondylolisthesis 05/29/2020   Spinal stenosis of lumbar region 05/29/2020   DDD (degenerative disc disease), lumbar 03/22/2020   Pseudofolliculitis 11/09/2018   Osteopenia 08/17/2017   GAD (generalized anxiety disorder) 07/07/2017   Nuclear sclerotic cataract of both eyes 01/12/2017   Primary open angle glaucoma of both eyes, mild stage 01/12/2017   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      Home Medications    Prior to Admission medications   Medication Sig Start Date End Date Taking? Authorizing Provider  acetaminophen (TYLENOL) 500 MG tablet Take 500 mg by mouth 2 (two) times daily as needed.    [provider]  amLODipine  (NORVASC ) 10 MG tablet Take 1 tablet (10 mg total) by mouth daily. 11/03/23   Towanda Fret, MD  atorvastatin  (LIPITOR) 40 MG tablet Take 1 tablet (  40 mg total) by mouth daily. 01/22/23   Towanda Fret, MD  Blood Glucose Monitoring Suppl DEVI 1 each by Does not apply route in the morning, at noon, and at bedtime. May substitute to any manufacturer covered by patient's insurance. 10/08/23   Towanda Fret, MD  brimonidine (ALPHAGAN) 0.2 % ophthalmic solution Place 1 drop into both eyes in the morning and at bedtime.    [provider]  chlorpheniramine  (CHLOR-TRIMETON ) 4 MG tablet Take one tablet by mouth once daily, as  needed, for excess congestion and drainage 11/05/23   Towanda Fret, MD  Cholecalciferol (VITAMIN D ) 50 MCG (2000 UT) tablet Take 1 tablet (2,000 Units total) by mouth daily. 03/24/23   Towanda Fret, MD  [facility] B-12 500 MCG tablet TAKE 1 TABLET BY MOUTH DAILY 09/13/23   Paulett Boros, MD  dorzolamide-timolol (COSOPT) 22.3-6.8 MG/ML ophthalmic solution Place 1 drop into both eyes 2 times daily. 10/21/20   [provider]  empagliflozin  (JARDIANCE ) 25 MG TABS tablet Take 1 tablet (25 mg total) by mouth daily before breakfast. 09/03/23   Towanda Fret, MD  escitalopram  (LEXAPRO ) 20 MG tablet TAKE 1 TABLET BY MOUTH EVERY DAY 10/13/23   Towanda Fret, MD  fluticasone  (FLONASE ) 50 MCG/ACT nasal spray Place 1 spray into both nostrils daily. 01/07/23   Towanda Fret, MD  glucose blood (ONETOUCH ULTRA) test strip USE AS DIRECTED TO MONITOR FASTING BLOOD SUGAR ONCE DAILY 03/29/23   Towanda Fret, MD  Lancet Devices [facility] PLUS LANCING) MISC  10/08/23   [provider]  Lancets MISC Please dispense based on patient and insurance preference. Use as directed to monitor FSBS 1x daily. Dx: E11.9. 02/21/20   Mathis Som, MD  linaclotide  (LINZESS ) 72 MCG capsule Take 1 capsule (72 mcg total) by mouth daily before breakfast. 07/06/23 06/30/24  Towanda Fret, MD  montelukast  (SINGULAIR ) 10 MG tablet TAKE 1 TABLET BY MOUTH AT  BEDTIME 10/18/23   Towanda Fret, MD  potassium chloride  SA (KLOR-CON  M) 20 MEQ tablet Take 1 tablet (20 mEq total) by mouth daily. 12/02/23   Aurther Blue, NP  valsartan -hydrochlorothiazide  (DIOVAN -HCT) 160-25 MG tablet TAKE 1 TABLET BY MOUTH DAILY 07/29/23   Towanda Fret, MD    Family History Family History  Problem Relation Age of Onset   Arthritis Mother    Cancer Sister    Diabetes Sister    Hyperlipidemia Sister    Hypertension Sister     Social History Social History   Tobacco Use   Smoking  status: Former    Current packs/day: 0.00    Types: Cigarettes    Quit date: 09/08/1981    Years since quitting: 42.3   Smokeless tobacco: Never  Substance Use Topics   Alcohol use: No   Drug use: No     Allergies   Patient has no known allergies.   Review of Systems Review of Systems Per HPI  Physical Exam Triage Vital Signs ED Triage Vitals [01/03/24 1053]  Encounter Vitals Group     BP 123/71     Systolic BP Percentile      Diastolic BP Percentile      Pulse Rate 84     Resp 18     Temp 98 F (36.7 C)     Temp Source Oral     SpO2 95 %     Weight      Height      Head  Circumference      Peak Flow      Pain Score 6     Pain Loc      Pain Education      Exclude from Growth Chart    No data found.  Updated Vital Signs BP 123/71 (BP Location: Right Arm)   Pulse 84   Temp 98 F (36.7 C) (Oral)   Resp 18   SpO2 95%   Visual Acuity Right Eye Distance:   Left Eye Distance:   Bilateral Distance:    Right Eye Near:   Left Eye Near:    Bilateral Near:     Physical Exam Vitals and nursing note reviewed.  Constitutional:      General: She is not in acute distress.    Appearance: Normal appearance.  HENT:     Head: Normocephalic.  Eyes:     Extraocular Movements: Extraocular movements intact.     Conjunctiva/sclera: Conjunctivae normal.     Pupils: Pupils are equal, round, and reactive to light.  Cardiovascular:     Rate and Rhythm: Normal rate and regular rhythm.     Pulses: Normal pulses.     Heart sounds: Normal heart sounds.  Pulmonary:     Effort: Pulmonary effort is normal.  Abdominal:     General: Bowel sounds are normal.     Palpations: Abdomen is soft.  Musculoskeletal:     Cervical back: Normal range of motion.     Left hip: Tenderness present. No deformity. Normal strength.       Legs:  Lymphadenopathy:     Cervical: No cervical adenopathy.  Skin:    General: Skin is warm and dry.  Neurological:     General: No focal deficit  present.     Mental Status: She is alert and oriented to person, place, and time.  Psychiatric:        Mood and Affect: Mood normal.        Behavior: Behavior normal.      UC Treatments / Results  Labs (all labs ordered are listed, but only abnormal results are displayed) Labs Reviewed - No data to display  EKG   Radiology No results found.  Procedures Procedures (including critical care time)  Medications Ordered in UC Medications - No data to display  Initial Impression / Assessment and Plan / UC Course  I have reviewed the triage vital signs and the nursing notes.  Pertinent labs & imaging results that were available during my care of the patient were reviewed by me and considered in my medical decision making (see chart for details).  On exam, patient with point tenderness to the left sciatic nerve.  Pain extends down to the left lateral leg to the left knee.  Symptoms consistent with left-sided sciatica.  Reviewed patient's chart, specifically her most recent lab work, patient with elevated creatinine, most recent A1c was 9.4 in January 2025.  Given patient's elevated creatinine, and diabetes, with most recent A1c of 9.4, we will avoid use of NSAIDs and corticosteroids.  Will treat with tizanidine  4 mg every 8 hours as needed for pain.  Patient advised to continue Tylenol as directed.  Supportive care recommendations were provided and discussed with patient's to include the use of ice or heat, warm Epsom salt soaks, and gentle stretching exercises.  Patient was given indications regarding follow-up.  Patient was advised to follow-up with her PCP for reevaluation.  Patient was in agreement with this plan of care and verbalizes understanding.  All questions were answered.  Patient stable for discharge.  Final Clinical Impressions(s) / UC Diagnoses   Final diagnoses:  None   Discharge Instructions   None    ED Prescriptions   None    PDMP not reviewed this encounter.    Hardy Lia, NP 01/03/24 [phone]

## 2024-01-03 NOTE — ED Triage Notes (Signed)
 Pt reports left leg pain from the hip to the knee, x 1 week. Pt has tried tylenol, Voltaren, and other NSAID'S.

## 2024-01-03 NOTE — Discharge Instructions (Signed)
 Take medication as prescribed. You may continue over-the-counter Tylenol as needed for pain or discomfort. Recommend the use of ice or heat.  Apply ice for pain or swelling, heat for spasm or stiffness.  Apply for 20 minutes, remove for 1 hour, repeat as needed. As discussed, also recommend the use of warm Epsom salt soaks.  Recommend soaks at least twice daily until symptoms improve. Gentle stretching exercises.  I provided exercises for you to perform at least 2-3 times daily while symptoms persist. Please follow-up with your primary care physician within the next 7 to 10 days for reevaluation, or sooner if symptoms worsen. Follow-up as needed.

## 2024-01-04 ENCOUNTER — Encounter: Payer: Self-pay | Admitting: Nutrition

## 2024-01-08 ENCOUNTER — Other Ambulatory Visit: Payer: Self-pay | Admitting: Family Medicine

## 2024-01-09 ENCOUNTER — Other Ambulatory Visit: Payer: Self-pay | Admitting: Family Medicine

## 2024-01-11 ENCOUNTER — Ambulatory Visit (INDEPENDENT_AMBULATORY_CARE_PROVIDER_SITE_OTHER): Payer: Medicare Other

## 2024-01-11 VITALS — BP 120/83 | Ht 68.0 in | Wt 202.0 lb

## 2024-01-11 DIAGNOSIS — Z780 Asymptomatic menopausal state: Secondary | ICD-10-CM | POA: Diagnosis not present

## 2024-01-11 DIAGNOSIS — Z0000 Encounter for general adult medical examination without abnormal findings: Secondary | ICD-10-CM

## 2024-01-11 NOTE — Progress Notes (Signed)
 Subjective:   Victoria Mcconnell is a 78 y.o. who presents for a Medicare Wellness preventive visit.  Visit Complete: Virtual I connected with  Jerzy Spitzley [place] on 01/11/24 by a audio enabled telemedicine application and verified that I am speaking with the correct person using two identifiers.  Patient Location: Home  Provider Location: Home Office  I discussed the limitations of evaluation and management by telemedicine. The patient expressed understanding and agreed to proceed.  Vital Signs: Because this visit was a virtual/telehealth visit, some criteria may be missing or patient reported. Any vitals not documented were not able to be obtained and vitals that have been documented are patient reported.  VideoDeclined- This patient declined Librarian, academic. Therefore the visit was completed with audio only.  Persons Participating in Visit: Patient.  AWV Questionnaire: Yes: Patient Medicare AWV questionnaire was completed by the patient on 01/10/2024; I have confirmed that all information answered by patient is correct and no changes since this date.  Cardiac Risk Factors include: advanced age (>67men, >73 women);diabetes mellitus;dyslipidemia;hypertension;obesity (BMI >30kg/m2)     Objective:    Today's Vitals   01/10/24 2109 01/11/24 1311  BP:  120/83  Weight:  202 lb (91.6 kg)  Height:  5\' 8"  (1.727 m)  PainSc: 0-No pain    Body mass index is 30.71 kg/m.     01/11/2024    1:37 PM 12/02/2023   10:05 AM 05/27/2023    1:48 PM 01/04/2023    3:55 PM 11/26/2022    9:39 AM 05/28/2022   10:45 AM 11/06/2021    8:46 AM  Advanced Directives  Does Patient Have a Medical Advance Directive? No No No No No No No  Would patient like information on creating a medical advance directive? No - Patient declined No - Patient declined No - Patient declined No - Patient declined No - Patient declined No - Patient declined No - Patient declined    Current Medications  (verified) Outpatient Encounter Medications as of 01/11/2024  Medication Sig   acetaminophen (TYLENOL) 500 MG tablet Take 500 mg by mouth 2 (two) times daily as needed.   amLODipine  (NORVASC ) 10 MG tablet Take 1 tablet (10 mg total) by mouth daily.   atorvastatin  (LIPITOR) 40 MG tablet TAKE 1 TABLET BY MOUTH EVERY DAY   Blood Glucose Monitoring Suppl DEVI 1 each by Does not apply route in the morning, at noon, and at bedtime. May substitute to any manufacturer covered by patient's insurance.   brimonidine (ALPHAGAN) 0.2 % ophthalmic solution Place 1 drop into both eyes in the morning and at bedtime.   Cholecalciferol (VITAMIN D ) 50 MCG (2000 UT) tablet Take 1 tablet (2,000 Units total) by mouth daily.   [employer] B-12 500 MCG tablet TAKE 1 TABLET BY MOUTH DAILY   dorzolamide-timolol (COSOPT) 22.3-6.8 MG/ML ophthalmic solution Place 1 drop into both eyes 2 times daily.   escitalopram  (LEXAPRO ) 20 MG tablet TAKE 1 TABLET BY MOUTH EVERY DAY   fluticasone  (FLONASE ) 50 MCG/ACT nasal spray Place 1 spray into both nostrils daily.   glucose blood (ONETOUCH ULTRA) test strip USE AS DIRECTED TO MONITOR FASTING BLOOD SUGAR ONCE DAILY   JARDIANCE  25 MG TABS tablet TAKE 1 TABLET BY MOUTH EVERY DAY BEFORE BREAKFAST   Lancet Devices (ONETOUCH DELICA PLUS LANCING) MISC    Lancets MISC Please dispense based on patient and insurance preference. Use as directed to monitor FSBS 1x daily. Dx: E11.9.   linaclotide  (LINZESS ) 72 MCG capsule Take  1 capsule (72 mcg total) by mouth daily before breakfast.   montelukast  (SINGULAIR ) 10 MG tablet TAKE 1 TABLET BY MOUTH AT  BEDTIME   potassium chloride  SA (KLOR-CON  M) 20 MEQ tablet Take 1 tablet (20 mEq total) by mouth daily.   tiZANidine  (ZANAFLEX ) 4 MG tablet Take 1 tablet (4 mg total) by mouth every 8 (eight) hours as needed for muscle spasms.   valsartan -hydrochlorothiazide  (DIOVAN -HCT) 160-25 MG tablet TAKE 1 TABLET BY MOUTH DAILY   chlorpheniramine  (CHLOR-TRIMETON ) 4 MG  tablet Take one tablet by mouth once daily, as needed, for excess congestion and drainage   No facility-administered encounter medications on file as of 01/11/2024.    Allergies (verified) Patient has no known allergies.   History: Past Medical History:  Diagnosis Date   Allergy    seasonal allergies   Anxiety    Arthritis    Cataract    Chronic kidney disease (CKD)    Depression, major, single episode, moderate (HCC) 07/01/2015   Diabetes mellitus    Glaucoma    Hyperlipidemia    Hypertension    Osteopenia    Past Surgical History:  Procedure Laterality Date   ABDOMINAL HYSTERECTOMY     CESAREAN SECTION     SPINE SURGERY N/A 11/11/2020   lam facetectomy & foramotomy 1 vrt sgm lumbar   TUBAL LIGATION     Family History  Problem Relation Age of Onset   Arthritis Mother    Kidney disease Mother    Cancer Sister    Hypertension Sister    Diabetes Sister    Hyperlipidemia Sister    Hypertension Sister    Social History   Socioeconomic History   Marital status: Widowed    Spouse name: Not on file   Number of children: Not on file   Years of education: Not on file   Highest education level: Bachelor's degree (e.g., BA, AB, BS)  Occupational History   Not on file  Tobacco Use   Smoking status: Former    Current packs/day: 0.00    Average packs/day: 0.5 packs/day for 3.0 years (1.5 ttl pk-yrs)    Types: Cigarettes    Quit date: 09/08/1981    Years since quitting: 42.3   Smokeless tobacco: Never  Substance and Sexual Activity   Alcohol use: No   Drug use: No   Sexual activity: Not Currently    Birth control/protection: None  Other Topics Concern   Not on file  Social History Narrative   Not on file   Social Drivers of Health   Financial Resource Strain: Low Risk  (01/11/2024)   Overall Financial Resource Strain (CARDIA)    Difficulty of Paying Living Expenses: Not hard at all  Food Insecurity: No Food Insecurity (01/11/2024)   Hunger Vital Sign     Worried About Running Out of Food in the Last Year: Never true    Ran Out of Food in the Last Year: Never true  Transportation Needs: No Transportation Needs (01/11/2024)   PRAPARE - Administrator, Civil Service (Medical): No    Lack of Transportation (Non-Medical): No  Physical Activity: Sufficiently Active (01/11/2024)   Exercise Vital Sign    Days of Exercise per Week: 7 days    Minutes of Exercise per Session: 30 min  Stress: No Stress Concern Present (01/11/2024)   [employer] of Occupational Health - Occupational Stress Questionnaire    Feeling of Stress : Not at all  Social Connections: Moderately Integrated (01/11/2024)  Social Advertising account executive [NHANES]    Frequency of Communication with Friends and Family: More than three times a week    Frequency of Social Gatherings with Friends and Family: More than three times a week    Attends Religious Services: More than 4 times per year    Active Member of [employer] or Organizations: Yes    Attends Banker Meetings: More than 4 times per year    Marital Status: Widowed    Tobacco Counseling Counseling given: Yes    Clinical Intake:  Pre-visit preparation completed: Yes  Pain : No/denies pain Pain Score: 0-No pain     BMI - recorded: 30.71 Nutritional Status: BMI > 30  Obese Nutritional Risks: None Diabetes: Yes CBG done?: No (telehealth visit. unable to obtain cbg) Did pt. bring in CBG monitor from home?: No  Lab Results  Component Value Date   HGBA1C 9.4 (H) 10/01/2023   HGBA1C 6.1 (H) 04/23/2023   HGBA1C 6.1 (H) 01/18/2023     How often do you need to have someone help you when you read instructions, pamphlets, or other written materials from your doctor or pharmacy?: 1 - Never  Interpreter Needed?: No  Information entered by :: Sally Crazier CMA   Activities of Daily Living     01/10/2024    9:09 PM  In your present state of health, do you have any difficulty performing  the following activities:  Hearing? 0  Vision? 0  Difficulty concentrating or making decisions? 0  Walking or climbing stairs? 0  Dressing or bathing? 0  Doing errands, shopping? 0  Preparing Food and eating ? N  Using the Toilet? N  In the past six months, have you accidently leaked urine? N  Do you have problems with loss of bowel control? N  Managing your Medications? N  Managing your Finances? N  Housekeeping or managing your Housekeeping? N    Patient Care Team: Towanda Fret, MD as PCP - General (Family Medicine) Albert Huff, MD as Consulting Physician (Ophthalmology) Joe Murders, DPM as Consulting Physician (Podiatry) Alpheus Arvin Donata Fryer, [facility] as [facility] Care Management (Pharmacist)  Indicate any recent Medical Services you may have received from other than [facility] providers in the past year (date may be approximate).     Assessment:   This is a routine wellness examination for [place].  Hearing/Vision screen Hearing Screening - Comments:: Patient denies any hearing difficulties.   Vision Screening - Comments:: Patient wears reading glasses only. Up to date with yearly exams.  Patient sees Dr. Dariel Edelson in [place] at [facility]     Goals Addressed             This Visit's Progress    Patient Stated       To work on losing weight.        Depression Screen     01/11/2024    1:37 PM 11/05/2023    8:12 AM 10/27/2023   12:57 PM 10/08/2023    8:56 AM 04/29/2023    1:21 PM 03/24/2023    3:14 PM 01/22/2023   10:54 AM  PHQ 2/9 Scores  PHQ - 2 Score 0 0 0 0 0 0 0  PHQ- 9 Score 0      0    Fall Risk     01/10/2024    9:09 PM 11/05/2023    8:12 AM 10/27/2023   12:56 PM 10/08/2023    8:56 AM 04/29/2023  1:21 PM  Fall Risk   Falls in the past year? 0 0 0 0 0  Number falls in past yr: 0 0 0 0 0  Injury with Fall? 0 0 0 0 0  Risk for fall due to : No Fall Risks No Fall Risks  No Fall Risks No Fall Risks  Follow up Falls  prevention discussed;Falls evaluation completed Falls evaluation completed  Falls evaluation completed Falls evaluation completed    MEDICARE RISK AT HOME:  Medicare Risk at Home Any stairs in or around the home?: (Patient-Rptd) Yes If so, are there any without handrails?: (Patient-Rptd) Yes Home free of loose throw rugs in walkways, pet beds, electrical cords, etc?: (Patient-Rptd) No Adequate lighting in your home to reduce risk of falls?: (Patient-Rptd) Yes Life alert?: (Patient-Rptd) No Use of a cane, walker or w/c?: (Patient-Rptd) No Grab bars in the bathroom?: (Patient-Rptd) Yes Shower chair or bench in shower?: (Patient-Rptd) No Elevated toilet seat or a handicapped toilet?: (Patient-Rptd) No  TIMED UP AND GO:  Was the test performed?  No  Cognitive Function: 6CIT completed        01/11/2024    1:37 PM 01/04/2023    3:58 PM 12/15/2021    4:14 PM  6CIT Screen  What Year? 0 points 0 points 0 points  What month? 0 points 0 points 0 points  What time? 0 points 0 points 0 points  Count back from 20 0 points 0 points 0 points  Months in reverse 0 points 0 points 0 points  Repeat phrase 0 points 0 points 0 points  Total Score 0 points 0 points 0 points    Immunizations Immunization History  Administered Date(s) Administered   Fluad Quad(high Dose 65+) 05/23/2019, 06/27/2020, 07/09/2021, 06/03/2022   Influenza, High Dose Seasonal PF 07/07/2017, 06/28/2023   Influenza,inj,Quad PF,6+ Mos 07/01/2015, 05/18/2018   Influenza-Unspecified 08/16/2002, 01/20/2014, 07/06/2016   MODERNA COVID-19 SARS-COV-2 PEDS BIVALENT BOOSTER 64yr-62yr 05/22/2023   Moderna Covid-19 Vaccine Bivalent Booster 48yrs & up 12/31/2020   Moderna SARS-COV2 Booster Vaccination 12/22/2019   Moderna Sars-Covid-2 Vaccination 10/22/2019, 11/22/2019, 05/24/2020, 07/14/2022   Pneumococcal Conjugate-13 08/27/2015   Pneumococcal Polysaccharide-23 09/08/2013, 01/20/2014, 02/20/2021   Tdap 07/28/2022   Zoster  Recombinant(Shingrix) 12/21/2020, 03/29/2021   Zoster, Live 09/13/2013    Screening Tests Health Maintenance  Topic Date Due   DEXA SCAN  05/25/2020   COVID-19 Vaccine (7 - 2024-25 season) 07/17/2023   FOOT EXAM  01/22/2024   OPHTHALMOLOGY EXAM  03/23/2024   HEMOGLOBIN A1C  03/30/2024   INFLUENZA VACCINE  04/14/2024   MAMMOGRAM  08/17/2024   Diabetic kidney evaluation - Urine ACR  10/07/2024   Diabetic kidney evaluation - eGFR measurement  11/22/2024   Medicare Annual Wellness (AWV)  01/10/2025   DTaP/Tdap/Td (2 - Td or Tdap) 07/28/2032   Pneumonia Vaccine 49+ Years old  Completed   Hepatitis C Screening  Completed   Zoster Vaccines- Shingrix  Completed   HPV VACCINES  Aged Out   Meningococcal B Vaccine  Aged Out   Colonoscopy  Discontinued    Health Maintenance  Health Maintenance Due  Topic Date Due   DEXA SCAN  05/25/2020   COVID-19 Vaccine (7 - 2024-25 season) 07/17/2023   Health Maintenance Items Addressed: DEXA ordered  Additional Screening:  Vision Screening: Recommended annual ophthalmology exams for early detection of glaucoma and other disorders of the eye.  Dental Screening: Recommended annual dental exams for proper oral hygiene  Community Resource Referral / Chronic Care Management: CRR  required this visit?  No   CCM required this visit?  No     Plan:     I have personally reviewed and noted the following in the patient's chart:   Medical and social history Use of alcohol, tobacco or illicit drugs  Current medications and supplements including opioid prescriptions. Patient is not currently taking opioid prescriptions. Functional ability and status Nutritional status Physical activity Advanced directives List of other physicians Hospitalizations, surgeries, and ER visits in previous 12 months Vitals Screenings to include cognitive, depression, and falls Referrals and appointments  In addition, I have reviewed and discussed with patient  certain preventive protocols, quality metrics, and best practice recommendations. A written personalized care plan for preventive services as well as general preventive health recommendations were provided to patient.      Tekia Waterbury, CMA   01/11/2024   After Visit Summary: (MyChart) Due to this being a telephonic visit, the after visit summary with patients personalized plan was offered to patient via MyChart   Notes: Please refer to Routing Comments.

## 2024-01-11 NOTE — Patient Instructions (Signed)
 Ms. Cogliano , Thank you for taking time to come for your Medicare Wellness Visit. I appreciate your ongoing commitment to your health goals. Please review the following plan we discussed and let me know if I can assist you in the future.   Referrals/Orders/Follow-Ups/Clinician Recommendations:    Next Medicare AWV: Jan 15, 2025 at 10:40 am video visit    [x]   An order for a mammogram and/or a bone density scan was placed for you today.        Please call the number below to schedule your appt.         [facility]        [address]        [place], [place] [address]        Phone: [phone] Make sure to wear two-piece clothing.  If you're having a mammogram, please remember: No lotions,powders, or deodorants the day of the appointment Make sure to bring picture ID and insurance card.  Bring list of medications you are currently taking including any supplements.  If you are having a bone density scan, please discontinue any medications that contain calcium  at least 48 hours (2 days) prior to your scan.     Aim for 30 minutes of exercise or brisk walking, 6-8 glasses of water, and 5 servings of fruits and vegetables each day.   This is a list of the screening recommended for you and due dates:  Health Maintenance  Topic Date Due   DEXA scan (bone density measurement)  05/25/2020   COVID-19 Vaccine (7 - 2024-25 season) 07/17/2023   Complete foot exam   01/22/2024   Eye exam for diabetics  03/23/2024   Hemoglobin A1C  03/30/2024   Flu Shot  04/14/2024   Mammogram  08/17/2024   Yearly kidney health urinalysis for diabetes  10/07/2024   Yearly kidney function blood test for diabetes  11/22/2024   Medicare Annual Wellness Visit  01/10/2025   DTaP/Tdap/Td vaccine (2 - Td or Tdap) 07/28/2032   Pneumonia Vaccine  Completed   Hepatitis C Screening  Completed   Zoster (Shingles) Vaccine  Completed   HPV Vaccine  Aged Out   Meningitis B Vaccine  Aged  Out   Colon Cancer Screening  Discontinued    Advanced directives: (Declined) Advance directive discussed with you today. Even though you declined this today, please call our office should you change your mind, and we can give you the proper paperwork for you to fill out. Advance Care Planning is important because it:  [x]  Makes sure you receive the medical care that is consistent with your values, goals, and preferences  [x]  It provides guidance to your family and loved ones and it also reduces their decisional burden about whether or not they are making the right decisions based on what you want done  Follow the link provided in your after visit summary or read over the paperwork we have mailed to you to help you started getting your Advance Directives in place. If you need assistance in completing these, please reach out to us  so that we can help you!  Next Medicare Annual Wellness Visit scheduled for next year: yes  Understanding Your Risk for Falls Millions of people have serious injuries from falls each year. It is important to understand your risk of falling. Talk with your health care provider about your risk and what you can do to lower it. If you do have a  serious fall, make sure to tell your provider. Falling once raises your risk of falling again. How can falls affect me? Serious injuries from falls are common. These include: Broken bones, such as hip fractures. Head injuries, such as traumatic brain injuries (TBI) or concussions. A fear of falling can cause you to avoid activities and stay at home. This can make your muscles weaker and raise your risk for a fall. What can increase my risk? There are a number of risk factors that increase your risk for falling. The more risk factors you have, the higher your risk of falling. Serious injuries from a fall happen most often to people who are older than 77 years old. Teenagers and young adults ages 98-29 are also at higher  risk. Common risk factors include: Weakness in the lower body. Being generally weak or confused due to long-term (chronic) illness. Dizziness or balance problems. Poor vision. Medicines that cause dizziness or drowsiness. These may include: Medicines for your blood pressure, heart, anxiety, insomnia, or swelling (edema). Pain medicines. Muscle relaxants. Other risk factors include: Drinking alcohol. Having had a fall in the past. Having foot pain or wearing improper footwear. Working at a dangerous job. Having any of the following in your home: Tripping hazards, such as floor clutter or loose rugs. Poor lighting. Pets. Having dementia or memory loss. What actions can I take to lower my risk of falling?  Physical activity Stay physically fit. Do strength and balance exercises. Consider taking a regular class to build strength and balance. Yoga and tai chi are good options. Vision Have your eyes checked every year and your prescription for glasses or contacts updated as needed. Shoes and walking aids Wear non-skid shoes. Wear shoes that have rubber soles and low heels. Do not wear high heels. Do not walk around the house in socks or slippers. Use a cane or walker as told by your provider. Home safety Attach secure railings on both sides of your stairs. Install grab bars for your bathtub, shower, and toilet. Use a non-skid mat in your bathtub or shower. Attach bath mats securely with double-sided, non-slip rug tape. Use good lighting in all rooms. Keep a flashlight near your bed. Make sure there is a clear path from your bed to the bathroom. Use night-lights. Do not use throw rugs. Make sure all carpeting is taped or tacked down securely. Remove all clutter from walkways and stairways, including extension cords. Repair uneven or broken steps and floors. Avoid walking on icy or slippery surfaces. Walk on the grass instead of on icy or slick sidewalks. Use ice melter to get rid of  ice on walkways in the winter. Use a cordless phone. Questions to ask your health care provider Can you help me check my risk for a fall? Do any of my medicines make me more likely to fall? Should I take a vitamin D  supplement? What exercises can I do to improve my strength and balance? Should I make an appointment to have my vision checked? Do I need a bone density test to check for weak bones (osteoporosis)? Would it help to use a cane or a walker? Where to find more information Centers for Disease Control and Prevention, STEADI: TonerPromos.no Community-Based Fall Prevention Programs: TonerPromos.no [employer] on Aging: BaseRingTones.pl Contact a health care provider if: You fall at home. You are afraid of falling at home. You feel weak, drowsy, or dizzy. This information is not intended to replace advice given to you by your health care  provider. Make sure you discuss any questions you have with your health care provider. Document Revised: 05/04/2022 Document Reviewed: 05/04/2022 Elsevier Patient Education  2024 [employer].

## 2024-01-17 ENCOUNTER — Other Ambulatory Visit: Payer: Self-pay

## 2024-01-17 DIAGNOSIS — E1169 Type 2 diabetes mellitus with other specified complication: Secondary | ICD-10-CM | POA: Diagnosis not present

## 2024-01-18 LAB — BMP8+EGFR
BUN/Creatinine Ratio: 12 (ref 12–28)
BUN: 15 mg/dL (ref 8–27)
CO2: 21 mmol/L (ref 20–29)
Calcium: 10.4 mg/dL — ABNORMAL HIGH (ref 8.7–10.3)
Chloride: 104 mmol/L (ref 96–106)
Creatinine, Ser: 1.24 mg/dL — ABNORMAL HIGH (ref 0.57–1.00)
Glucose: 99 mg/dL (ref 70–99)
Potassium: 3.7 mmol/L (ref 3.5–5.2)
Sodium: 139 mmol/L (ref 134–144)
eGFR: 45 mL/min/{1.73_m2} — ABNORMAL LOW (ref >59)

## 2024-01-18 LAB — HEMOGLOBIN A1C
Est. average glucose Bld gHb Est-mCnc: 128 mg/dL
Hgb A1c MFr Bld: 6.1 % — ABNORMAL HIGH (ref 4.8–5.6)

## 2024-01-20 ENCOUNTER — Encounter: Payer: Self-pay | Admitting: Family Medicine

## 2024-01-20 ENCOUNTER — Other Ambulatory Visit: Payer: Self-pay | Admitting: Family Medicine

## 2024-01-20 ENCOUNTER — Ambulatory Visit (INDEPENDENT_AMBULATORY_CARE_PROVIDER_SITE_OTHER): Payer: Medicare Other | Admitting: Family Medicine

## 2024-01-20 VITALS — BP 117/74 | HR 82 | Resp 18 | Ht 68.0 in | Wt 203.1 lb

## 2024-01-20 DIAGNOSIS — E1169 Type 2 diabetes mellitus with other specified complication: Secondary | ICD-10-CM | POA: Diagnosis not present

## 2024-01-20 DIAGNOSIS — Z1231 Encounter for screening mammogram for malignant neoplasm of breast: Secondary | ICD-10-CM

## 2024-01-20 DIAGNOSIS — E1159 Type 2 diabetes mellitus with other circulatory complications: Secondary | ICD-10-CM | POA: Diagnosis not present

## 2024-01-20 DIAGNOSIS — M5442 Lumbago with sciatica, left side: Secondary | ICD-10-CM

## 2024-01-20 DIAGNOSIS — E785 Hyperlipidemia, unspecified: Secondary | ICD-10-CM | POA: Diagnosis not present

## 2024-01-20 DIAGNOSIS — I10 Essential (primary) hypertension: Secondary | ICD-10-CM

## 2024-01-20 DIAGNOSIS — E559 Vitamin D deficiency, unspecified: Secondary | ICD-10-CM

## 2024-01-20 DIAGNOSIS — N1831 Chronic kidney disease, stage 3a: Secondary | ICD-10-CM

## 2024-01-20 MED ORDER — POTASSIUM CHLORIDE CRYS ER 20 MEQ PO TBCR: 20.0000 meq | EXTENDED_RELEASE_TABLET | Freq: Every day | ORAL | 2 refills | Status: DC

## 2024-01-20 NOTE — Assessment & Plan Note (Signed)
 Controlled, no change in medication Diabetes associated with hypertension, hyperlipidemia, and arthritis  Victoria Mcconnell is reminded of the importance of commitment to daily physical activity for 30 minutes or more, as able and the need to limit carbohydrate intake to 30 to 60 grams per meal to help with blood sugar control.   The need to take medication as prescribed, test blood sugar as directed, and to call between visits if there is a concern that blood sugar is uncontrolled is also discussed.   Victoria Mcconnell is reminded of the importance of daily foot exam, annual eye examination, and good blood sugar, blood pressure and cholesterol control.     Latest Ref Rng & Units 01/17/2024    8:17 AM 11/23/2023    9:56 AM 10/08/2023   10:08 AM 10/01/2023    1:06 PM 05/21/2023   10:46 AM  Diabetic Labs  HbA1c 4.8 - 5.6 % 6.1    9.4    Micro/Creat Ratio 0 - 29 mg/g creat   7     Chol 100 - 199 mg/dL    086    HDL >57 mg/dL    42    Calc LDL 0 - 99 mg/dL    63    Triglycerides 0 - 149 mg/dL    846    Creatinine 9.62 - 1.00 mg/dL 9.52  8.41   3.24  4.01       01/20/2024    8:04 AM 01/11/2024    1:11 PM 01/03/2024   10:53 AM 12/27/2023   11:52 AM 12/02/2023   10:04 AM 11/05/2023    8:10 AM 10/27/2023   12:56 PM  BP/Weight  Systolic BP 117 120 123  119 114   Diastolic BP 74 83 71  68 76   Wt. (Lbs) 203.08 202  204 204.81 207.4 209  BMI 30.88 kg/m2 30.71 kg/m2  31.02 kg/m2 31.14 kg/m2 31.54 kg/m2 31.78 kg/m2      Latest Ref Rng & Units 01/20/2024    8:00 AM 03/23/2023   12:00 AM  Foot/eye exam completion dates  Eye Exam No Retinopathy  No Retinopathy      Foot Form Completion  Done      This result is from an external source.

## 2024-01-20 NOTE — Assessment & Plan Note (Signed)
Stable, followed by Nephrology ?

## 2024-01-20 NOTE — Patient Instructions (Addendum)
 Annual exam with flu vaccine early October, call if you need me sooner  Take tylenol arthritis one twice daily for 5 days  Please schedule mammogram at checkout  Take muscle relaxant one at bedtime for 5 days , th only if needed  Use aspercreme  2 to 3 times daily as needed  CONGRATULATIONS   Foot exam today  Fasting lipid, cmp and EGFr, HBa1C,  TSH an vit D 3 to 5 days before next appt  Potassium is refilled  It is important that you exercise regularly at least 30 minutes 5 times a week. If you develop chest pain, have severe difficulty breathing, or feel very tired, stop exercising immediately and seek medical attention   Think about what you will eat, plan ahead. Choose " clean, green, fresh or frozen" over canned, processed or packaged foods which are more sugary, salty and fatty. 70 to 75% of food eaten should be vegetables and fruit. Three meals at set times with snacks allowed between meals, but they must be fruit or vegetables. Aim to eat over a 12 hour period , example 7 am to 7 pm, and STOP after  your last meal of the day. Drink water,generally about 64 ounces per day, no other drink is as healthy. Fruit juice is best enjoyed in a healthy way, by EATING the fruit. Thanks for choosing [facility], we consider it a privelige to serve you.

## 2024-01-20 NOTE — Assessment & Plan Note (Signed)
 Improved, needs to take 5 day course of medication for hopefully symptom resolution of recent flare

## 2024-01-20 NOTE — Progress Notes (Signed)
 Victoria Mcconnell     MRN: [id number]      DOB: 12/10/1945  Chief Complaint  Patient presents with   Annual Exam    cpe   Leg Pain    Complains of left hip/leg pain since 04/21. Describes it as an aching pain. Went to [facility] 04/21 and dx with sciatic nerve pain     HPI Victoria Mcconnell is here for follow up and re-evaluation of chronic medical conditions, medication management and review of any available recent lab and radiology data.  Preventive health is updated, specifically  Cancer screening and Immunization.   Recently treated for acute left sciatic at urgent Care symptoms improving , has ahd back surgery in the past Denies polyuria, polydipsia, blurred vision , or hypoglycemic episodes. The PT denies any adverse reactions to current medications since the last visit.  There are no new concerns.  There are no specific complaints   ROS Denies recent fever or chills. Denies sinus pressure, nasal congestion, ear pain or sore throat. Denies chest congestion, productive cough or wheezing. Denies chest pains, palpitations and leg swelling Denies abdominal pain, nausea, vomiting,diarrhea or constipation.   Denies dysuria, frequency, hesitancy or incontinence. . Denies headaches, seizures, numbness, or tingling. Denies depression, anxiety or insomnia. Denies skin break down or rash.   PE  BP 117/74   Pulse 82   Resp 18   Ht 5\' 8"  (1.727 m)   Wt 203 lb 1.3 oz (92.1 kg)   SpO2 96%   BMI 30.88 kg/m   Patient alert and oriented and in no cardiopulmonary distress.  HEENT: No facial asymmetry, EOMI,     Neck supple .  Chest: Clear to auscultation bilaterally.  CVS: S1, S2 no murmurs, no S3.Regular rate.  ABD: Soft non tender.   Ext: No edema  MS: Adequate though reduced  ROM spine, shoulders, hips and knees.  Skin: Intact, no ulcerations or rash noted.  Psych: Good eye contact, normal affect. Memory intact not anxious or depressed appearing.  CNS: CN 2-12 intact, power,   normal throughout.no focal deficits noted.   Assessment & Plan  Type 2 diabetes mellitus with other specified complication (HCC) Controlled, no change in medication Diabetes associated with hypertension, hyperlipidemia, and arthritis  Victoria Mcconnell is reminded of the importance of commitment to daily physical activity for 30 minutes or more, as able and the need to limit carbohydrate intake to 30 to 60 grams per meal to help with blood sugar control.   The need to take medication as prescribed, test blood sugar as directed, and to call between visits if there is a concern that blood sugar is uncontrolled is also discussed.   Victoria Mcconnell is reminded of the importance of daily foot exam, annual eye examination, and good blood sugar, blood pressure and cholesterol control.     Latest Ref Rng & Units 01/17/2024    8:17 AM 11/23/2023    9:56 AM 10/08/2023   10:08 AM 10/01/2023    1:06 PM 05/21/2023   10:46 AM  Diabetic Labs  HbA1c 4.8 - 5.6 % 6.1    9.4    Micro/Creat Ratio 0 - 29 mg/g creat   7     Chol 100 - 199 mg/dL    409    HDL >81 mg/dL    42    Calc LDL 0 - 99 mg/dL    63    Triglycerides 0 - 149 mg/dL    191    Creatinine 4.78 -  1.00 mg/dL 1.61  0.96   0.45  4.09       01/20/2024    8:04 AM 01/11/2024    1:11 PM 01/03/2024   10:53 AM 12/27/2023   11:52 AM 12/02/2023   10:04 AM 11/05/2023    8:10 AM 10/27/2023   12:56 PM  BP/Weight  Systolic BP 117 120 123  119 114   Diastolic BP 74 83 71  68 76   Wt. (Lbs) 203.08 202  204 204.81 207.4 209  BMI 30.88 kg/m2 30.71 kg/m2  31.02 kg/m2 31.14 kg/m2 31.54 kg/m2 31.78 kg/m2      Latest Ref Rng & Units 01/20/2024    8:00 AM 03/23/2023   12:00 AM  Foot/eye exam completion dates  Eye Exam No Retinopathy  No Retinopathy      Foot Form Completion  Done      This result is from an external source.        CKD (chronic kidney disease) stage 3, GFR 30-59 ml/min (HCC) Stable, followed by Nephrology  Essential (primary)  hypertension Controlled, no change in medication DASH diet and commitment to daily physical activity for a minimum of 30 minutes discussed and encouraged, as a part of hypertension management. The importance of attaining a healthy weight is also discussed.     01/20/2024    8:04 AM 01/11/2024    1:11 PM 01/03/2024   10:53 AM 12/27/2023   11:52 AM 12/02/2023   10:04 AM 11/05/2023    8:10 AM 10/27/2023   12:56 PM  BP/Weight  Systolic BP 117 120 123  119 114   Diastolic BP 74 83 71  68 76   Wt. (Lbs) 203.08 202  204 204.81 207.4 209  BMI 30.88 kg/m2 30.71 kg/m2  31.02 kg/m2 31.14 kg/m2 31.54 kg/m2 31.78 kg/m2       Hyperlipidemia Hyperlipidemia:Low fat diet discussed and encouraged.   Lipid Panel  Lab Results  Component Value Date   CHOL 126 10/01/2023   HDL 42 10/01/2023   LDLCALC 63 10/01/2023   TRIG 113 10/01/2023   CHOLHDL 3.0 10/01/2023     Updated lab needed at/ before next visit. Controlled, no change in medication   Low back pain with left-sided sciatica Improved, needs to take 5 day course of medication for hopefully symptom resolution of recent flare

## 2024-01-20 NOTE — Assessment & Plan Note (Signed)
 Controlled, no change in medication DASH diet and commitment to daily physical activity for a minimum of 30 minutes discussed and encouraged, as a part of hypertension management. The importance of attaining a healthy weight is also discussed.     01/20/2024    8:04 AM 01/11/2024    1:11 PM 01/03/2024   10:53 AM 12/27/2023   11:52 AM 12/02/2023   10:04 AM 11/05/2023    8:10 AM 10/27/2023   12:56 PM  BP/Weight  Systolic BP 117 120 123  119 114   Diastolic BP 74 83 71  68 76   Wt. (Lbs) 203.08 202  204 204.81 207.4 209  BMI 30.88 kg/m2 30.71 kg/m2  31.02 kg/m2 31.14 kg/m2 31.54 kg/m2 31.78 kg/m2

## 2024-01-20 NOTE — Assessment & Plan Note (Signed)
 Hyperlipidemia:Low fat diet discussed and encouraged.   Lipid Panel  Lab Results  Component Value Date   CHOL 126 10/01/2023   HDL 42 10/01/2023   LDLCALC 63 10/01/2023   TRIG 113 10/01/2023   CHOLHDL 3.0 10/01/2023     Updated lab needed at/ before next visit. Controlled, no change in medication

## 2024-01-25 ENCOUNTER — Inpatient Hospital Stay (HOSPITAL_COMMUNITY): Admission: RE | Admit: 2024-01-25 | Source: Ambulatory Visit

## 2024-01-28 ENCOUNTER — Ambulatory Visit (HOSPITAL_COMMUNITY)
Admission: RE | Admit: 2024-01-28 | Discharge: 2024-01-28 | Disposition: A | Discharge status: Home / Self Care | Source: Ambulatory Visit | Attending: Family Medicine | Admitting: Family Medicine

## 2024-01-28 ENCOUNTER — Ambulatory Visit: Payer: Self-pay | Admitting: Family Medicine

## 2024-01-28 DIAGNOSIS — M85852 Other specified disorders of bone density and structure, left thigh: Secondary | ICD-10-CM | POA: Diagnosis not present

## 2024-01-28 DIAGNOSIS — Z780 Asymptomatic menopausal state: Secondary | ICD-10-CM | POA: Insufficient documentation

## 2024-01-28 DIAGNOSIS — M85851 Other specified disorders of bone density and structure, right thigh: Secondary | ICD-10-CM | POA: Diagnosis not present

## 2024-02-14 ENCOUNTER — Ambulatory Visit (INDEPENDENT_AMBULATORY_CARE_PROVIDER_SITE_OTHER): Admitting: Podiatry

## 2024-02-14 DIAGNOSIS — M79674 Pain in right toe(s): Secondary | ICD-10-CM | POA: Diagnosis not present

## 2024-02-14 DIAGNOSIS — B351 Tinea unguium: Secondary | ICD-10-CM | POA: Diagnosis not present

## 2024-02-14 DIAGNOSIS — M79675 Pain in left toe(s): Secondary | ICD-10-CM

## 2024-02-14 NOTE — Progress Notes (Signed)
    Subjective:  Patient ID: Victoria Mcconnell, female    DOB: 09/20/1945,  MRN: [id number]  Victoria Mcconnell presents to clinic today for:  Chief Complaint  Patient presents with   Nail Problem    Nail trim    Patient notes nails are thick, discolored, elongated and painful in shoegear when trying to ambulate.    PCP is Towanda Fret, MD.  Past Medical History:  Diagnosis Date   Allergy    seasonal allergies   Anxiety    Arthritis    Cataract    Chronic kidney disease (CKD)    Depression, major, single episode, moderate (HCC) 07/01/2015   Diabetes mellitus    Glaucoma    Hyperlipidemia    Hypertension    Osteopenia    Past Surgical History:  Procedure Laterality Date   ABDOMINAL HYSTERECTOMY     CESAREAN SECTION     SPINE SURGERY N/A 11/11/2020   lam facetectomy & foramotomy 1 vrt sgm lumbar   TUBAL LIGATION     No Known Allergies  Review of Systems: Negative except as noted in the HPI.  Objective:  Victoria Mcconnell is a pleasant 77 y.o. female in NAD. AAO x 3.  Vascular Examination: Capillary refill time is 3-5 seconds to toes bilateral. Palpable pedal pulses b/l LE. Digital hair present b/l.  Skin temperature gradient WNL b/l. No varicosities b/l. No cyanosis noted b/l.   Dermatological Examination: Pedal skin with normal turgor, texture and tone b/l. No open wounds. No interdigital macerations b/l. Toenails x10 are 3mm thick, discolored, dystrophic with subungual debris. There is pain with compression of the nail plates.  They are elongated x10     Latest Ref Rng & Units 01/17/2024    8:17 AM 10/01/2023    1:06 PM 04/23/2023   11:04 AM  Hemoglobin A1C  Hemoglobin-A1c 4.8 - 5.6 % 6.1  9.4  6.1    Assessment/Plan: 1. Pain due to onychomycosis of toenails of both feet    The mycotic toenails were sharply debrided x10 with sterile nail nippers and a power debriding burr to decrease bulk/thickness and length.    Recommended Revita Derm 40 cream for the rough  heels.  There is no significant hyperkeratotic buildup that needs shaved with a scalpel today.  Return in about 3 months (around 05/16/2024) for [facility].   Joe Murders, DPM, FACFAS [facility]     [address] [place], [place] [address]                Office [phone]  Fax [phone]

## 2024-03-22 ENCOUNTER — Telehealth: Payer: Self-pay

## 2024-03-22 NOTE — Telephone Encounter (Signed)
 Copied from CRM [id number]. Topic: Clinical - Prescription Issue >> Mar 21, 2024  4:56 PM Turkey B wrote: Reason for CRM: pt called in states her insurance is no longer covering, one touch test strips. Please cb with other options. She has letter showing what would be covered

## 2024-03-23 NOTE — Telephone Encounter (Signed)
 Left vm to see what the letter stated would be covered

## 2024-03-23 NOTE — Telephone Encounter (Signed)
 Patient will bring insurance letter in to office

## 2024-03-23 NOTE — Telephone Encounter (Signed)
 Letter given to Victoria Mcconnell to determine which diabetic supplies dr simpson would like to switch patient to

## 2024-03-24 ENCOUNTER — Other Ambulatory Visit: Payer: Self-pay

## 2024-03-24 MED ORDER — BLOOD GLUCOSE TEST VI STRP: 1.0000 | ORAL_STRIP | Freq: Every day | 0 refills | Status: AC | PRN

## 2024-03-24 MED ORDER — BLOOD GLUCOSE MONITORING SUPPL DEVI: 1.0000 | Freq: Every day | 0 refills | Status: AC

## 2024-03-24 MED ORDER — LANCET DEVICE MISC: 1.0000 | Freq: Every day | 0 refills | Status: AC

## 2024-03-24 MED ORDER — LANCETS MISC. MISC: 1.0000 | Freq: Every day | 0 refills | Status: AC

## 2024-03-24 NOTE — Telephone Encounter (Signed)
 Done

## 2024-03-30 DIAGNOSIS — H5213 Myopia, bilateral: Secondary | ICD-10-CM | POA: Diagnosis not present

## 2024-03-30 DIAGNOSIS — H25013 Cortical age-related cataract, bilateral: Secondary | ICD-10-CM | POA: Diagnosis not present

## 2024-03-30 DIAGNOSIS — H401131 Primary open-angle glaucoma, bilateral, mild stage: Secondary | ICD-10-CM | POA: Diagnosis not present

## 2024-04-12 DIAGNOSIS — L814 Other melanin hyperpigmentation: Secondary | ICD-10-CM | POA: Diagnosis not present

## 2024-04-28 DIAGNOSIS — E785 Hyperlipidemia, unspecified: Secondary | ICD-10-CM | POA: Diagnosis not present

## 2024-04-28 DIAGNOSIS — E1169 Type 2 diabetes mellitus with other specified complication: Secondary | ICD-10-CM | POA: Diagnosis not present

## 2024-04-28 DIAGNOSIS — E559 Vitamin D deficiency, unspecified: Secondary | ICD-10-CM | POA: Diagnosis not present

## 2024-04-28 DIAGNOSIS — I10 Essential (primary) hypertension: Secondary | ICD-10-CM | POA: Diagnosis not present

## 2024-04-29 LAB — CMP14+EGFR
ALT: 14 [IU]/L (ref 0–32)
AST: 18 [IU]/L (ref 0–40)
Albumin: 4.5 g/dL (ref 3.8–4.8)
Alkaline Phosphatase: 93 [IU]/L (ref 44–121)
BUN/Creatinine Ratio: 11 — ABNORMAL LOW (ref 12–28)
BUN: 12 mg/dL (ref 8–27)
Bilirubin Total: 0.6 mg/dL (ref 0.0–1.2)
CO2: 22 mmol/L (ref 20–29)
Calcium: 10.5 mg/dL — ABNORMAL HIGH (ref 8.7–10.3)
Chloride: 104 mmol/L (ref 96–106)
Creatinine, Ser: 1.14 mg/dL — ABNORMAL HIGH (ref 0.57–1.00)
Globulin, Total: 3.1 g/dL (ref 1.5–4.5)
Glucose: 99 mg/dL (ref 70–99)
Potassium: 3.6 mmol/L (ref 3.5–5.2)
Sodium: 140 mmol/L (ref 134–144)
Total Protein: 7.6 g/dL (ref 6.0–8.5)
eGFR: 50 mL/min/{1.73_m2} — ABNORMAL LOW

## 2024-04-29 LAB — LIPID PANEL
Chol/HDL Ratio: 2.1 ratio (ref 0.0–4.4)
Cholesterol, Total: 164 mg/dL (ref 100–199)
HDL: 79 mg/dL (ref >39)
LDL Chol Calc (NIH): 70 mg/dL (ref 0–99)
Triglycerides: 82 mg/dL (ref 0–149)
VLDL Cholesterol Cal: 15 mg/dL (ref 5–40)

## 2024-04-29 LAB — TSH: TSH: 2.23 u[IU]/mL (ref 0.450–4.500)

## 2024-04-29 LAB — HEMOGLOBIN A1C
Est. average glucose Bld gHb Est-mCnc: 134 mg/dL
Hgb A1c MFr Bld: 6.3 % — ABNORMAL HIGH (ref 4.8–5.6)

## 2024-04-29 LAB — VITAMIN D 25 HYDROXY (VIT D DEFICIENCY, FRACTURES): Vit D, 25-Hydroxy: 36 ng/mL (ref 30.0–100.0)

## 2024-05-02 ENCOUNTER — Encounter: Payer: Self-pay | Admitting: Family Medicine

## 2024-05-02 ENCOUNTER — Ambulatory Visit (INDEPENDENT_AMBULATORY_CARE_PROVIDER_SITE_OTHER): Admitting: Family Medicine

## 2024-05-02 VITALS — BP 113/70 | HR 83 | Resp 16 | Ht 68.0 in | Wt 204.1 lb

## 2024-05-02 DIAGNOSIS — Z0001 Encounter for general adult medical examination with abnormal findings: Secondary | ICD-10-CM

## 2024-05-02 DIAGNOSIS — I10 Essential (primary) hypertension: Secondary | ICD-10-CM | POA: Diagnosis not present

## 2024-05-02 DIAGNOSIS — E1169 Type 2 diabetes mellitus with other specified complication: Secondary | ICD-10-CM

## 2024-05-02 NOTE — Progress Notes (Signed)
    Victoria Mcconnell     MRN: [id number]      DOB: 1945/10/15  Chief Complaint  Patient presents with   Annual Exam    Cpe    Hand Pain    Complains of left thumb and index tingling and numbness off and on for couple weeks. No known injury     HPI: Patient is in for annual physical exam. Concern as aboveRecent labs,  are reviewed. Immunization is reviewed , and  updated if needed.   PE: BP 113/70   Pulse 83   Resp 16   Ht 5' 8 (1.727 m)   Wt 204 lb 1.9 oz (92.6 kg)   SpO2 96%   BMI 31.04 kg/m   Pleasant  female, alert and oriented x 3, in no cardio-pulmonary distress. Afebrile. HEENT No facial trauma or asymetry. Sinuses non tender.  Extra occullar muscles intact.. External ears normal, . Neck: supple, no adenopathy,JVD or thyromegaly.No bruits.  Chest: Clear to ascultation bilaterally.No crackles or wheezes. Non tender to palpation  Breast: No asymetry,no masses or lumps. No tenderness. No nipple discharge or inversion. No axillary or supraclavicular adenopathy  Cardiovascular system; Heart sounds normal,  S1 and  S2 ,no S3.  No murmur, or thrill. Apical beat not displaced Peripheral pulses normal.  Abdomen: Soft, non tender, no organomegaly or masses. No bruits. Bowel sounds normal. No guarding, tenderness or rebound.     Musculoskeletal exam: Full ROM of spine, hips , shoulders and knees. No deformity ,swelling or crepitus noted. No muscle wasting or atrophy.   Neurologic: Cranial nerves 2 to 12 intact. Power, tone ,sensation and reflexes normal throughout. No disturbance in gait. No tremor.  Skin: Intact, no ulceration, erythema , scaling or rash noted. Pigmentation normal throughout  Psych; Normal mood and affect. Judgement and concentration normal   Assessment & Plan:  Encounter for Medicare annual examination with abnormal findings Annual exam as documented. Counseling done  re healthy lifestyle involving commitment to 150 minutes  exercise per week, heart healthy diet, and attaining healthy weight.The importance of adequate sleep also discussed. Regular seat belt use and home safety, is also discussed. Changes in health habits are decided on by the patient with goals and time frames  set for achieving them. Immunization and cancer screening needs are specifically addressed at this visit.

## 2024-05-02 NOTE — Assessment & Plan Note (Signed)

## 2024-05-02 NOTE — Patient Instructions (Addendum)
 F/U in January  Keep up great health habits, labs are excellent, no change in meds  Monitor the  numbness  on left thumb, if persists , worsens or new areas feel numb reach out I will refer you to Neurology  Non fasting HbA1C, cmp and EGFR 3 to 5 days before Jan appt  Need covid vaccine this is at your pharmacy  Nurse pls send to Ophthalmology Dr Adine Haddock for diabetic eye exam report has been there this year  It is important that you exercise regularly at least 30 minutes 5 times a week. If you develop chest pain, have severe difficulty breathing, or feel very tired, stop exercising immediately and seek medical attention   Think about what you will eat, plan ahead. Choose  clean, green, fresh or frozen over canned, processed or packaged foods which are more sugary, salty and fatty. 70 to 75% of food eaten should be vegetables and fruit. Three meals at set times with snacks allowed between meals, but they must be fruit or vegetables. Aim to eat over a 12 hour period , example 7 am to 7 pm, and STOP after  your last meal of the day. Drink water,generally about 64 ounces per day, no other drink is as healthy. Fruit juice is best enjoyed in a healthy way, by EATING the fruit. Thanks for choosing [facility], we consider it a privelige to serve you.

## 2024-05-16 ENCOUNTER — Encounter: Payer: Self-pay | Admitting: Podiatry

## 2024-05-16 ENCOUNTER — Ambulatory Visit (INDEPENDENT_AMBULATORY_CARE_PROVIDER_SITE_OTHER): Admitting: Podiatry

## 2024-05-16 DIAGNOSIS — M79674 Pain in right toe(s): Secondary | ICD-10-CM | POA: Diagnosis not present

## 2024-05-16 DIAGNOSIS — B351 Tinea unguium: Secondary | ICD-10-CM | POA: Diagnosis not present

## 2024-05-16 DIAGNOSIS — M79675 Pain in left toe(s): Secondary | ICD-10-CM

## 2024-05-16 NOTE — Progress Notes (Unsigned)
    Subjective:  Patient ID: Victoria Mcconnell, female    DOB: Oct 02, 1945,  MRN: [id number]  Victoria Mcconnell presents to clinic today for:  Chief Complaint  Patient presents with   Diabetes    DFC  A1c 6.3 . no calluses. No anti coag   Patient notes nails are thick, discolored, elongated and painful in shoegear when trying to ambulate.    PCP is Antonetta Rollene BRAVO, MD.  Past Medical History:  Diagnosis Date   Allergy    seasonal allergies   Anxiety    Arthritis    Cataract    Chronic kidney disease (CKD)    Depression, major, single episode, moderate (HCC) 07/01/2015   Diabetes mellitus    Glaucoma    Hyperlipidemia    Hypertension    Osteopenia    Past Surgical History:  Procedure Laterality Date   ABDOMINAL HYSTERECTOMY     CESAREAN SECTION     SPINE SURGERY N/A 11/11/2020   lam facetectomy & foramotomy 1 vrt sgm lumbar   TUBAL LIGATION     No Known Allergies  Review of Systems: Negative except as noted in the HPI.  Objective:  Victoria Mcconnell is a pleasant 78 y.o. female in NAD. AAO x 3.  Vascular Examination: Capillary refill time is 3-5 seconds to toes bilateral. Palpable pedal pulses b/l LE. Digital hair present b/l.  Skin temperature gradient WNL b/l. No varicosities b/l. No cyanosis noted b/l.   Dermatological Examination: Pedal skin with normal turgor, texture and tone b/l. No open wounds. No interdigital macerations b/l. Toenails x10 are 3mm thick, discolored, dystrophic with subungual debris. There is pain with compression of the nail plates.  They are elongated x10     Latest Ref Rng & Units 04/28/2024    9:23 AM 01/17/2024    8:17 AM 10/01/2023    1:06 PM  Hemoglobin A1C  Hemoglobin-A1c 4.8 - 5.6 % 6.3  6.1  9.4    Assessment/Plan: 1. Pain due to onychomycosis of toenails of both feet    The mycotic toenails were sharply debrided x10 with sterile nail nippers and a power debriding burr to decrease bulk/thickness and length.    Return in about 3  months (around 08/15/2024) for [facility].   Victoria Mcconnell, DPM, FACFAS [facility]     [address] [place], [place] [address]                Office [phone]  Fax [phone]

## 2024-06-01 ENCOUNTER — Inpatient Hospital Stay: Attending: Oncology

## 2024-06-01 DIAGNOSIS — D472 Monoclonal gammopathy: Secondary | ICD-10-CM | POA: Insufficient documentation

## 2024-06-01 DIAGNOSIS — D649 Anemia, unspecified: Secondary | ICD-10-CM | POA: Diagnosis not present

## 2024-06-01 DIAGNOSIS — Z807 Family history of other malignant neoplasms of lymphoid, hematopoietic and related tissues: Secondary | ICD-10-CM | POA: Diagnosis not present

## 2024-06-01 DIAGNOSIS — E538 Deficiency of other specified B group vitamins: Secondary | ICD-10-CM

## 2024-06-01 DIAGNOSIS — N183 Chronic kidney disease, stage 3 unspecified: Secondary | ICD-10-CM

## 2024-06-01 DIAGNOSIS — D803 Selective deficiency of immunoglobulin G [IgG] subclasses: Secondary | ICD-10-CM | POA: Diagnosis not present

## 2024-06-01 LAB — COMPREHENSIVE METABOLIC PANEL WITH GFR
ALT: 15 U/L (ref 0–44)
AST: 19 U/L (ref 15–41)
Albumin: 4.1 g/dL (ref 3.5–5.0)
Alkaline Phosphatase: 73 U/L (ref 38–126)
Anion gap: 10 (ref 5–15)
BUN: 16 mg/dL (ref 8–23)
CO2: 23 mmol/L (ref 22–32)
Calcium: 10.1 mg/dL (ref 8.9–10.3)
Chloride: 107 mmol/L (ref 98–111)
Creatinine, Ser: 1.15 mg/dL — ABNORMAL HIGH (ref 0.44–1.00)
GFR, Estimated: 49 mL/min — ABNORMAL LOW (ref >60)
Glucose, Bld: 102 mg/dL — ABNORMAL HIGH (ref 70–99)
Potassium: 3.6 mmol/L (ref 3.5–5.1)
Sodium: 140 mmol/L (ref 135–145)
Total Bilirubin: 0.9 mg/dL (ref 0.0–1.2)
Total Protein: 8.1 g/dL (ref 6.5–8.1)

## 2024-06-01 LAB — CBC WITH DIFFERENTIAL/PLATELET
Abs Immature Granulocytes: 0.02 K/uL (ref 0.00–0.07)
Basophils Absolute: 0.1 K/uL (ref 0.0–0.1)
Basophils Relative: 1 %
Eosinophils Absolute: 0.2 K/uL (ref 0.0–0.5)
Eosinophils Relative: 3 %
HCT: 37.8 % (ref 36.0–46.0)
Hemoglobin: 12.7 g/dL (ref 12.0–15.0)
Immature Granulocytes: 0 %
Lymphocytes Relative: 18 %
Lymphs Abs: 1.4 K/uL (ref 0.7–4.0)
MCH: 30.7 pg (ref 26.0–34.0)
MCHC: 33.6 g/dL (ref 30.0–36.0)
MCV: 91.3 fL (ref 80.0–100.0)
Monocytes Absolute: 0.6 K/uL (ref 0.1–1.0)
Monocytes Relative: 9 %
Neutro Abs: 5.2 K/uL (ref 1.7–7.7)
Neutrophils Relative %: 69 %
Platelets: 224 K/uL (ref 150–400)
RBC: 4.14 MIL/uL (ref 3.87–5.11)
RDW: 13.6 % (ref 11.5–15.5)
WBC: 7.6 K/uL (ref 4.0–10.5)
nRBC: 0 % (ref 0.0–0.2)

## 2024-06-01 LAB — VITAMIN B12: Vitamin B-12: 955 pg/mL — ABNORMAL HIGH (ref 180–914)

## 2024-06-02 LAB — KAPPA/LAMBDA LIGHT CHAINS
Kappa free light chain: 23.2 mg/L — ABNORMAL HIGH (ref 3.3–19.4)
Kappa, lambda light chain ratio: 0.72 (ref 0.26–1.65)
Lambda free light chains: 32.1 mg/L — ABNORMAL HIGH (ref 5.7–26.3)

## 2024-06-04 ENCOUNTER — Other Ambulatory Visit: Payer: Self-pay | Admitting: Family Medicine

## 2024-06-05 LAB — PROTEIN ELECTROPHORESIS, SERUM
A/G Ratio: 1.1 (ref 0.7–1.7)
Albumin ELP: 3.8 g/dL (ref 2.9–4.4)
Alpha-1-Globulin: 0.3 g/dL (ref 0.0–0.4)
Alpha-2-Globulin: 0.7 g/dL (ref 0.4–1.0)
Beta Globulin: 1.1 g/dL (ref 0.7–1.3)
Gamma Globulin: 1.4 g/dL (ref 0.4–1.8)
Globulin, Total: 3.5 g/dL (ref 2.2–3.9)
M-Spike, %: 0.8 g/dL — ABNORMAL HIGH
Total Protein ELP: 7.3 g/dL (ref 6.0–8.5)

## 2024-06-06 LAB — IMMUNOFIXATION ELECTROPHORESIS
IgA: 134 mg/dL (ref 64–422)
IgG (Immunoglobin G), Serum: 1678 mg/dL — ABNORMAL HIGH (ref 586–1602)
IgM (Immunoglobulin M), Srm: 53 mg/dL (ref 26–217)
Total Protein ELP: 7.2 g/dL (ref 6.0–8.5)

## 2024-06-06 LAB — METHYLMALONIC ACID, SERUM: Methylmalonic Acid, Quantitative: 134 nmol/L (ref 0–378)

## 2024-06-08 ENCOUNTER — Ambulatory Visit (INDEPENDENT_AMBULATORY_CARE_PROVIDER_SITE_OTHER)

## 2024-06-08 ENCOUNTER — Inpatient Hospital Stay: Admitting: Oncology

## 2024-06-08 VITALS — BP 127/77 | HR 71 | Temp 98.5°F | Resp 18 | Wt 206.6 lb

## 2024-06-08 DIAGNOSIS — D8481 Immunodeficiency due to conditions classified elsewhere: Secondary | ICD-10-CM

## 2024-06-08 DIAGNOSIS — D803 Selective deficiency of immunoglobulin G [IgG] subclasses: Secondary | ICD-10-CM | POA: Diagnosis not present

## 2024-06-08 DIAGNOSIS — D472 Monoclonal gammopathy: Secondary | ICD-10-CM | POA: Diagnosis not present

## 2024-06-08 DIAGNOSIS — D649 Anemia, unspecified: Secondary | ICD-10-CM | POA: Diagnosis not present

## 2024-06-08 DIAGNOSIS — Z23 Encounter for immunization: Secondary | ICD-10-CM | POA: Diagnosis not present

## 2024-06-08 DIAGNOSIS — Z807 Family history of other malignant neoplasms of lymphoid, hematopoietic and related tissues: Secondary | ICD-10-CM | POA: Diagnosis not present

## 2024-06-08 NOTE — Assessment & Plan Note (Addendum)
-   Hemoglobin has normalized and is 12.7. -No additional workup needed. -We discussed reducing B12 to every other day given B12 levels were high.  MMA was normal.

## 2024-06-08 NOTE — Assessment & Plan Note (Deleted)
-   Hemoglobin ranged from 10.9-normal over the past 5 years. -Anemia likely secondary to B12 deficiency (resolved) and CKD. -Hemoglobin from 11/23/23 is 13.0. Most recent creatinine is 1.11.  - Hematology workup (11/19/2022): Ferritin 130, iron saturation 24%.  Normal folate, LDH, copper . - B12 level from 11/23/23 is 871 and MMA is 178. -Continue B12 supplements. -Will recheck labs in 6 months.

## 2024-06-08 NOTE — Progress Notes (Signed)
 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  M spike 0.8.  No crab criteria-calcium  10.1, stable CKD creatinine 1.15, hemoglobin 12.7 and no new bone pain. - Most recent skeletal survey (11/19/2022) was negative for any suspicious osseous lesions - She denies any new bone pain, B symptoms, or neurologic changes. - Patient's sister has multiple myeloma in remission after stem cell transplant - Patient meets diagnostic criteria for MGUS only.  No smoldering myeloma or multiple myeloma at this time. -There is no concern for progression to myeloma at this time.  No indication for treatment of bone marrow biopsy.  We will continue to monitor every 6 months. -Repeat bone survey annually-next due in March 2026. Anemia, unspecified type - Hemoglobin has normalized and is 12.7. -No additional workup needed. -We discussed reducing B12 to every other day given B12 levels were high.  MMA was normal.  Orders Placed This Encounter  Procedures   DG Bone Survey Met    Standing Status:   Future    Expected Date:   12/06/2024    Expiration Date:   06/08/2025    Reason for Exam (SYMPTOM  OR DIAGNOSIS REQUIRED):   MGUS    Preferred imaging location?:   [facility]   Immunofixation electrophoresis    Standing Status:   Future    Expected Date:   12/07/2024    Expiration Date:   06/08/2025   Protein electrophoresis, serum    Standing Status:   Future    Expected Date:   12/07/2024    Expiration Date:   06/08/2025   CBC with Differential    Standing Status:   Future    Expected Date:   12/07/2024    Expiration Date:   06/08/2025   Comprehensive metabolic panel    Standing Status:   Future    Expected Date:   12/07/2024    Expiration Date:   06/08/2025   Kappa/lambda light chains    Standing  Status:   Future    Expected Date:   12/07/2024    Expiration Date:   06/08/2025   Vitamin B12    Standing Status:   Future    Expected Date:   12/07/2024    Expiration Date:   06/08/2025   Methylmalonic acid, serum    Standing Status:   Future    Expected Date:   12/07/2024    Expiration Date:   06/08/2025    INTERVAL HISTORY: Patient returns for follow-up for MGUS.  Appetite and energy levels are 100%.  Denies any pain.  Reports she takes B12 supplements daily.  Denies any hospitalizations, surgeries or changes to her baseline health.  We reviewed MGUS labs, CBC, CMP, B12 and MMA.  SUMMARY OF HEMATOLOGIC HISTORY: Oncology History   No history exists.     Seen at the request of her nephrologist (Dr. Almarie Bonine) for work-up of possible MGUS. - Laboratory work-up sent by her nephrologist (10/14/2021) shows M spike of 1.0 and immunofixation showing IgG lambda monoclonal protein.  Kappa light chains 27.4 with lambda light chains 32.1 and ratio 0.85. - 24-hour urine with UPEP/U IFE was negative.  Normal beta-2  microglobulin.  Normal LDH.  CBC    Component Value Date/Time   WBC 7.6 06/01/2024 0955   RBC 4.14 06/01/2024 0955   HGB  12.7 06/01/2024 0955   HGB 13.0 10/01/2023 1306   HCT 37.8 06/01/2024 0955   HCT 40.1 10/01/2023 1306   PLT 224 06/01/2024 0955   PLT 148 (L) 10/01/2023 1306   MCV 91.3 06/01/2024 0955   MCV 86 10/01/2023 1306   MCH 30.7 06/01/2024 0955   MCHC 33.6 06/01/2024 0955   RDW 13.6 06/01/2024 0955   RDW 14.8 10/01/2023 1306   LYMPHSABS 1.4 06/01/2024 0955   MONOABS 0.6 06/01/2024 0955   EOSABS 0.2 06/01/2024 0955   BASOSABS 0.1 06/01/2024 0955       Latest Ref Rng & Units 06/01/2024    9:55 AM 04/28/2024    9:23 AM 01/17/2024    8:17 AM  CMP  Glucose 70 - 99 mg/dL 897  99  99   BUN 8 - 23 mg/dL 16  12  15    Creatinine 0.44 - 1.00 mg/dL 8.84  8.85  8.75   Sodium 135 - 145 mmol/L 140  140  139   Potassium 3.5 - 5.1 mmol/L 3.6  3.6  3.7   Chloride 98 -  111 mmol/L 107  104  104   CO2 22 - 32 mmol/L 23  22  21    Calcium  8.9 - 10.3 mg/dL 89.8  89.4  89.5   Total Protein 6.5 - 8.1 g/dL 8.1  7.6    Total Bilirubin 0.0 - 1.2 mg/dL 0.9  0.6    Alkaline Phos 38 - 126 U/L 73  93    AST 15 - 41 U/L 19  18    ALT 0 - 44 U/L 15  14       Lab Results  Component Value Date   FERRITIN 130 11/19/2022   VITAMINB12 955 (H) 06/01/2024    Vitals:   06/08/24 1033  BP: 127/77  Pulse: 71  Resp: 18  Temp: 98.5 F (36.9 C)  SpO2: 99%    Review of System:  Review of Systems  All other systems reviewed and are negative.   Physical Exam: Physical Exam Constitutional:      Appearance: Normal appearance.  HENT:     Head: Normocephalic and atraumatic.  Eyes:     Pupils: Pupils are equal, round, and reactive to light.  Cardiovascular:     Rate and Rhythm: Normal rate and regular rhythm.     Heart sounds: Normal heart sounds. No murmur heard. Pulmonary:     Effort: Pulmonary effort is normal.     Breath sounds: Normal breath sounds. No wheezing.  Abdominal:     General: Bowel sounds are normal. There is no distension.     Palpations: Abdomen is soft.     Tenderness: There is no abdominal tenderness.  Musculoskeletal:        General: Normal range of motion.     Cervical back: Normal range of motion.  Skin:    General: Skin is warm and dry.     Findings: No rash.  Neurological:     Mental Status: She is alert and oriented to person, place, and time.     Gait: Gait is intact.  Psychiatric:        Mood and Affect: Mood and affect normal.        Cognition and Memory: Memory normal.        Judgment: Judgment normal.      I spent 20 minutes dedicated to the care of this patient (face-to-face and non-face-to-face) on the date of the encounter to include what is  described in the assessment and plan.,  Delon Hope, NP 06/08/2024 12:31 PM

## 2024-06-08 NOTE — Assessment & Plan Note (Addendum)
-  Labs from 05/29/24 show overall stable myeloma labs.  M spike 0.8.  No crab criteria-calcium  10.1, stable CKD creatinine 1.15, hemoglobin 12.7 and no new bone pain. - Most recent skeletal survey (11/19/2022) was negative for any suspicious osseous lesions - She denies any new bone pain, B symptoms, or neurologic changes. - Patient's sister has multiple myeloma in remission after stem cell transplant - Patient meets diagnostic criteria for MGUS only.  No smoldering myeloma or multiple myeloma at this time. -There is no concern for progression to myeloma at this time.  No indication for treatment of bone marrow biopsy.  We will continue to monitor every 6 months. -Repeat bone survey annually-next due in March 2026.

## 2024-06-14 ENCOUNTER — Ambulatory Visit

## 2024-06-15 ENCOUNTER — Other Ambulatory Visit: Payer: Self-pay | Admitting: Family Medicine

## 2024-06-19 ENCOUNTER — Other Ambulatory Visit: Payer: Self-pay | Admitting: Family Medicine

## 2024-06-21 ENCOUNTER — Encounter: Admitting: Family Medicine

## 2024-06-27 ENCOUNTER — Encounter: Attending: Family Medicine | Admitting: Nutrition

## 2024-06-27 VITALS — Ht 68.0 in | Wt 209.0 lb

## 2024-06-27 DIAGNOSIS — E1159 Type 2 diabetes mellitus with other circulatory complications: Secondary | ICD-10-CM | POA: Insufficient documentation

## 2024-06-27 DIAGNOSIS — I10 Essential (primary) hypertension: Secondary | ICD-10-CM | POA: Insufficient documentation

## 2024-06-27 NOTE — Progress Notes (Signed)
 Medical Nutrition Therapy  Appointment Start time:  1120  Appointment End time:  1200  Primary concerns today: Type 2 Diabetes  Referral diagnosis: E11.8 Preferred learning style: No Preference  Learning readiness: Ready   NUTRITION ASSESSMENT  DM Follow up  Diet is too restrictive but eating better food choices overall and less processed foods.. She is eating 1-2 meals per day. Not meeting her nutritional needs. Gained 3 lbs. Trying to be very careful about foods for her kidneys. A1C 6.3 in August up from from 6.3%. Has been working on limiting protein and eating more vegetables Going to the [facility] three times per week.  7 DAY AVG 97 mg/dl.  14 day avg 98  Using Accucheck Guide Meter Still takikng Jardiance  B12 is high- is taking EOD Vit B 12. Sees CKD MD in [place].  Clinical Wt Readings from Last 3 Encounters:  06/27/24 209 lb (94.8 kg)  06/08/24 206 lb 9.1 oz (93.7 kg)  05/02/24 204 lb 1.9 oz (92.6 kg)   Ht Readings from Last 3 Encounters:  06/27/24 5' 8 (1.727 m)  05/02/24 5' 8 (1.727 m)  01/20/24 5' 8 (1.727 m)   Body mass index is 31.78 kg/m. @BMIFA @ Facility age limit for growth %iles is 20 years. Facility age limit for growth %iles is 20 years.  Medical Hx:  Past Medical History:  Diagnosis Date   Allergy    seasonal allergies   Anxiety    Arthritis    Cataract    Chronic kidney disease (CKD)    Depression, major, single episode, moderate (HCC) 07/01/2015   Diabetes mellitus    Glaucoma    Hyperlipidemia    Hypertension    Osteopenia     Medications:  Current Outpatient Medications on File Prior to Visit  Medication Sig Dispense Refill   Accu-Chek FastClix Lancets MISC USE ONCE DAILY - DX E11.65 102 each 0   acetaminophen (TYLENOL) 500 MG tablet Take 500 mg by mouth 2 (two) times daily as needed.     amLODipine  (NORVASC ) 10 MG tablet TAKE 1 TABLET BY MOUTH EVERY DAY 90 tablet 1   atorvastatin  (LIPITOR) 40 MG tablet TAKE 1 TABLET BY MOUTH EVERY DAY 90  tablet 3   Blood Glucose Monitoring Suppl DEVI 1 each by Does not apply route in the morning, at noon, and at bedtime. May substitute to any manufacturer covered by patient's insurance. 1 each 0   Blood Glucose Monitoring Suppl DEVI 1 each by Does not apply route daily. May substitute to brand covered by insurance. DX e11.65 1 each 0   brimonidine (ALPHAGAN) 0.2 % ophthalmic solution Place 1 drop into both eyes in the morning and at bedtime.     Cholecalciferol (VITAMIN D ) 50 MCG (2000 UT) tablet Take 1 tablet (2,000 Units total) by mouth daily.     [facility] B-12 500 MCG tablet TAKE 1 TABLET BY MOUTH DAILY 100 tablet 2   dorzolamide-timolol (COSOPT) 22.3-6.8 MG/ML ophthalmic solution Place 1 drop into both eyes 2 times daily.     escitalopram  (LEXAPRO ) 20 MG tablet TAKE 1 TABLET BY MOUTH EVERY DAY 90 tablet 2   fluticasone  (FLONASE ) 50 MCG/ACT nasal spray Place 1 spray into both nostrils daily. 16 g 1   Glucose Blood (BLOOD GLUCOSE TEST STRIPS) STRP 1 each by In Vitro route daily as needed. May substitute to any manufacturer covered by patient's insurance. DX : E11.65 100 strip 0   glucose blood (ONETOUCH ULTRA) test strip USE AS DIRECTED TO MONITOR  FASTING BLOOD SUGAR ONCE DAILY 100 strip 3   JARDIANCE  25 MG TABS tablet TAKE 1 TABLET BY MOUTH EVERY DAY BEFORE BREAKFAST 30 tablet 5   Lancet Devices (ONETOUCH DELICA PLUS LANCING) MISC      linaclotide  (LINZESS ) 72 MCG capsule Take 1 capsule (72 mcg total) by mouth daily before breakfast. 90 capsule 3   montelukast  (SINGULAIR ) 10 MG tablet TAKE 1 TABLET BY MOUTH AT  BEDTIME 100 tablet 2   potassium chloride  SA (KLOR-CON  M) 20 MEQ tablet Take 1 tablet (20 mEq total) by mouth daily. 90 tablet 2   valsartan -hydrochlorothiazide  (DIOVAN -HCT) 160-25 MG tablet TAKE 1 TABLET BY MOUTH DAILY 100 tablet 2   No current facility-administered medications on file prior to visit.    Labs:  Lab Results  Component Value Date   HGBA1C 6.3 (H) 04/28/2024         Latest Ref Rng & Units 06/01/2024    9:55 AM 04/28/2024    9:23 AM 01/17/2024    8:17 AM  CMP  Glucose 70 - 99 mg/dL 897  99  99   BUN 8 - 23 mg/dL 16  12  15    Creatinine 0.44 - 1.00 mg/dL 8.84  8.85  8.75   Sodium 135 - 145 mmol/L 140  140  139   Potassium 3.5 - 5.1 mmol/L 3.6  3.6  3.7   Chloride 98 - 111 mmol/L 107  104  104   CO2 22 - 32 mmol/L 23  22  21    Calcium  8.9 - 10.3 mg/dL 89.8  89.4  89.5   Total Protein 6.5 - 8.1 g/dL 8.1  7.6    Total Bilirubin 0.0 - 1.2 mg/dL 0.9  0.6    Alkaline Phos 38 - 126 U/L 73  93    AST 15 - 41 U/L 19  18    ALT 0 - 44 U/L 15  14      Notable Signs/Symptoms: None  Lifestyle & Dietary Hx Lives by herself.  Estimated daily fluid intake: 60 oz Supplements: Vit D Sleep: 6-7 hrs Stress / self-care: None Current average weekly physical activity: Going to the [facility] and rides exercise bike 4 times per week.   24-Hr Dietary Recall First Meal: 1 cup coffee, Oatmeal, cinnamon bread Snack: 3 grapes Second Meal: skip , or  grapes and blueberries Snack: water Third Meal: Steak, toss salad, sweet potato, water Snack: water Beverages: water  Estimated Energy Needs Calories: 1500 Carbohydrate: 170g Protein: 112g Fat: 42g   NUTRITION DIAGNOSIS  NB-1.1 Food and nutrition-related knowledge deficit As related to Diabetes Type 2.  As evidenced by A1C 9.4%.   NUTRITION INTERVENTION  Nutrition education (E-1) on the following topics:  Nutrition and Diabetes education provided on My Plate, CHO counting, meal planning, portion sizes, timing of meals, avoiding snacks between meals unless having a low blood sugar, target ranges for A1C and blood sugars, signs/symptoms and treatment of hyper/hypoglycemia, monitoring blood sugars, taking medications as prescribed, benefits of exercising 30 minutes per day and prevention of complications of DM.  Lifestyle Medicine  - Whole Food, Plant Predominant Nutrition is highly recommended: Eat Plenty of  vegetables, Mushrooms, fruits, Legumes, Whole Grains, Nuts, seeds in lieu of processed meats, processed snacks/pastries red meat, poultry, eggs.    -It is better to avoid simple carbohydrates including: Cakes, Sweet Desserts, Ice Cream, Soda (diet and regular), Sweet Tea, Candies, Chips, Cookies, Store Bought Juices, Alcohol in Excess of  1-2 drinks a day, Lemonade,  Artificial Sweeteners, Doughnuts, Coffee Creamers, Sugar-free Products, etc, etc.  This is not a complete list.....  Exercise: If you are able: 30 -60 minutes a day ,4 days a week, or 150 minutes a week.  The longer the better.  Combine stretch, strength, and aerobic activities.  If you were told in the past that you have high risk for cardiovascular diseases, you may seek evaluation by your heart doctor prior to initiating moderate to intense exercise programs.   Handouts Provided Include  Lifestyle Medicine handouts Know your numbers   Learning Style & Readiness for Change Teaching method utilized: Visual & Auditory  Demonstrated degree of understanding via: Teach Back  Barriers to learning/adherence to lifestyle change: None  Goals Established by Pt  [email or web address]. for information on kidneys and recipes Keep up the great job. Keep eating 3 balanced meals with whole plant based foods Choose plant protein over animal protein Check out mediterranean cookbook at [employer]  MONITORING & EVALUATION Dietary intake, weekly physical activity, and Diabetes Type 2 .  Next Steps  Patient is to work on eating more consistent meals.SABRA

## 2024-06-27 NOTE — Patient Instructions (Addendum)
 [email or web address]. for information on kidneys and recipes Keep up the great job. Keep eating 3 balanced meals with whole plant based foods Choose plant protein over animal protein Check out mediterranean cookbook at [employer]

## 2024-07-04 ENCOUNTER — Other Ambulatory Visit: Payer: Self-pay | Admitting: Family Medicine

## 2024-08-15 ENCOUNTER — Ambulatory Visit: Admitting: Podiatry

## 2024-08-21 ENCOUNTER — Inpatient Hospital Stay (HOSPITAL_COMMUNITY): Admission: RE | Admit: 2024-08-21 | Discharge: 2024-08-21 | Attending: Family Medicine | Admitting: Family Medicine

## 2024-08-21 DIAGNOSIS — Z1231 Encounter for screening mammogram for malignant neoplasm of breast: Secondary | ICD-10-CM

## 2024-08-28 ENCOUNTER — Other Ambulatory Visit: Payer: Self-pay | Admitting: Family Medicine

## 2024-09-03 ENCOUNTER — Other Ambulatory Visit: Payer: Self-pay | Admitting: Family Medicine

## 2024-09-04 ENCOUNTER — Ambulatory Visit: Admitting: Podiatry

## 2024-09-04 DIAGNOSIS — M79675 Pain in left toe(s): Secondary | ICD-10-CM

## 2024-09-04 DIAGNOSIS — E1151 Type 2 diabetes mellitus with diabetic peripheral angiopathy without gangrene: Secondary | ICD-10-CM

## 2024-09-04 DIAGNOSIS — B351 Tinea unguium: Secondary | ICD-10-CM

## 2024-09-04 DIAGNOSIS — M79674 Pain in right toe(s): Secondary | ICD-10-CM

## 2024-09-04 NOTE — Progress Notes (Signed)
"   °  °  Subjective:  Patient ID: Victoria Mcconnell, female    DOB: 1945/09/16,  MRN: [id number]  Victoria Mcconnell presents to clinic today for:  Chief Complaint  Patient presents with   [facility]    [facility] with  no callous A1c was 6.1 in Aug, pt reported No anti coag.    Patient notes nails are thick, discolored, elongated and painful in shoegear when trying to ambulate.    PCP is Antonetta Rollene BRAVO, MD.  Past Medical History:  Diagnosis Date   Allergy    seasonal allergies   Anxiety    Arthritis    Cataract    Chronic kidney disease (CKD)    Depression, major, single episode, moderate (HCC) 07/01/2015   Diabetes mellitus    Glaucoma    Hyperlipidemia    Hypertension    Osteopenia    Past Surgical History:  Procedure Laterality Date   ABDOMINAL HYSTERECTOMY     CESAREAN SECTION     SPINE SURGERY N/A 11/11/2020   lam facetectomy & foramotomy 1 vrt sgm lumbar   TUBAL LIGATION     No Known Allergies  Review of Systems: Negative except as noted in the HPI.  Objective:  Victoria Mcconnell is a pleasant 78 y.o. female in NAD. AAO x 3.  Vascular Examination: Capillary refill time is 3-5 seconds to toes bilateral. Palpable pedal pulses b/l LE. Digital hair present b/l.  Skin temperature gradient WNL b/l. No varicosities b/l. No cyanosis noted b/l.   Dermatological Examination: Pedal skin with normal turgor, texture and tone b/l. No open wounds. No interdigital macerations b/l. Toenails x10 are 3mm thick, discolored, dystrophic with subungual debris. There is pain with compression of the nail plates.  They are elongated x10     Latest Ref Rng & Units 04/28/2024    9:23 AM 01/17/2024    8:17 AM 10/01/2023    1:06 PM  Hemoglobin A1C  Hemoglobin-A1c 4.8 - 5.6 % 6.3  6.1  9.4    Assessment/Plan: 1. Pain due to onychomycosis of toenails of both feet   2. Type 2 diabetes with peripheral circulatory disorder, controlled (HCC)    The mycotic toenails were sharply debrided x10 with sterile nail  nippers and a power debriding burr to decrease bulk/thickness and length.    Return in about 3 months (around 12/03/2024) for [facility].   Awanda CHARM Imperial, DPM, FACFAS [facility]     [address] [place], [place] [address]                Office [phone]  Fax [phone] "

## 2024-09-27 ENCOUNTER — Other Ambulatory Visit: Payer: Self-pay | Admitting: Family Medicine

## 2024-09-30 LAB — CMP14+EGFR
ALT: 16 IU/L (ref 0–32)
AST: 17 IU/L (ref 0–40)
Albumin: 4.2 g/dL (ref 3.8–4.8)
Alkaline Phosphatase: 87 IU/L (ref 49–135)
BUN/Creatinine Ratio: 12 (ref 12–28)
BUN: 13 mg/dL (ref 8–27)
Bilirubin Total: 0.5 mg/dL (ref 0.0–1.2)
CO2: 22 mmol/L (ref 20–29)
Calcium: 10.4 mg/dL — ABNORMAL HIGH (ref 8.7–10.3)
Chloride: 102 mmol/L (ref 96–106)
Creatinine, Ser: 1.12 mg/dL — ABNORMAL HIGH (ref 0.57–1.00)
Globulin, Total: 2.7 g/dL (ref 1.5–4.5)
Glucose: 98 mg/dL (ref 70–99)
Potassium: 3.9 mmol/L (ref 3.5–5.2)
Sodium: 139 mmol/L (ref 134–144)
Total Protein: 6.9 g/dL (ref 6.0–8.5)
eGFR: 51 mL/min/1.73 — ABNORMAL LOW

## 2024-09-30 LAB — HEMOGLOBIN A1C
Est. average glucose Bld gHb Est-mCnc: 131 mg/dL
Hgb A1c MFr Bld: 6.2 % — ABNORMAL HIGH (ref 4.8–5.6)

## 2024-10-04 ENCOUNTER — Ambulatory Visit: Admitting: Family Medicine

## 2024-10-04 ENCOUNTER — Encounter: Payer: Self-pay | Admitting: Family Medicine

## 2024-10-04 ENCOUNTER — Ambulatory Visit: Payer: Self-pay | Admitting: Family Medicine

## 2024-10-04 VITALS — BP 124/76 | HR 102 | Resp 16 | Ht 68.0 in | Wt 215.1 lb

## 2024-10-04 DIAGNOSIS — Z9189 Other specified personal risk factors, not elsewhere classified: Secondary | ICD-10-CM | POA: Diagnosis not present

## 2024-10-04 DIAGNOSIS — I10 Essential (primary) hypertension: Secondary | ICD-10-CM

## 2024-10-04 DIAGNOSIS — F411 Generalized anxiety disorder: Secondary | ICD-10-CM

## 2024-10-04 DIAGNOSIS — N1831 Chronic kidney disease, stage 3a: Secondary | ICD-10-CM

## 2024-10-04 DIAGNOSIS — Z1211 Encounter for screening for malignant neoplasm of colon: Secondary | ICD-10-CM | POA: Diagnosis not present

## 2024-10-04 DIAGNOSIS — E785 Hyperlipidemia, unspecified: Secondary | ICD-10-CM

## 2024-10-04 DIAGNOSIS — E1122 Type 2 diabetes mellitus with diabetic chronic kidney disease: Secondary | ICD-10-CM | POA: Diagnosis not present

## 2024-10-04 DIAGNOSIS — E559 Vitamin D deficiency, unspecified: Secondary | ICD-10-CM | POA: Diagnosis not present

## 2024-10-04 DIAGNOSIS — F321 Major depressive disorder, single episode, moderate: Secondary | ICD-10-CM

## 2024-10-04 DIAGNOSIS — E1169 Type 2 diabetes mellitus with other specified complication: Secondary | ICD-10-CM | POA: Diagnosis not present

## 2024-10-04 NOTE — Patient Instructions (Addendum)
 Annual exam 8/20 or after  EKG today  You are referred for coronary calcium  screen     Nurse please arrange FIT test   Urine ACR  next week Fasting CBC, lipid, cmp and eGFR, HBA1C, TSH and vit D in 6 months  It is important that you exercise regularly at least 30 minutes 5 times a week. If you develop chest pain, have severe difficulty breathing, or feel very tired, stop exercising immediately and seek medical attention   Think about what you will eat, plan ahead. Choose  clean, green, fresh or frozen over canned, processed or packaged foods which are more sugary, salty and fatty. 70 to 75% of food eaten should be vegetables and fruit. Three meals at set times with snacks allowed between meals, but they must be fruit or vegetables. Aim to eat over a 12 hour period , example 7 am to 7 pm, and STOP after  your last meal of the day. Drink water,generally about 64 ounces per day, no other drink is as healthy. Fruit juice is best enjoyed in a healthy way, by EATING the fruit.  No med changes  Thanks for choosing [facility], we consider it a privelige to serve you.

## 2024-10-04 NOTE — Progress Notes (Signed)
 "  Victoria Mcconnell     MRN: [id number]      DOB: 24-Jan-1946  Chief Complaint  Patient presents with   Medical Management of Chronic Issues    Follow up     HPI Victoria Mcconnell is here for follow up and re-evaluation of chronic medical conditions, medication management and review of any available recent lab and radiology data.  Preventive health is updated, specifically  Cancer screening and Immunization.   Questions or concerns regarding consultations or procedures which the PT has had in the interim are  addressed. The PT denies any adverse reactions to current medications since the last visit.  There are no new concerns.  There are no specific complaints   ROS Denies recent fever or chills. Denies sinus pressure, nasal congestion, ear pain or sore throat. Denies chest congestion, productive cough or wheezing. Denies chest pains, palpitations and leg swelling Denies abdominal pain, nausea, vomiting,diarrhea or constipation.   Denies dysuria, frequency, hesitancy or incontinence. Denies joint pain, swelling and limitation in mobility. Denies headaches, seizures, numbness, or tingling. Denies depression, anxiety or insomnia. Denies skin break down or rash.   PE  BP 124/76   Pulse (!) 102   Resp 16   Ht 5' 8 (1.727 m)   Wt 215 lb 1.9 oz (97.6 kg)   SpO2 96%   BMI 32.71 kg/m   Patient alert and oriented and in no cardiopulmonary distress.  HEENT: No facial asymmetry, EOMI,     Neck supple .  Chest: Clear to auscultation bilaterally.  CVS: S1, S2 no murmurs, no S3.Regular rate. EKG: NSR no ischemia, npo LVH  ABD: Soft non tender.   Ext: No edema  MS: Adequate ROM spine, shoulders, hips and knees.  Skin: Intact, no ulcerations or rash noted.  Psych: Good eye contact, normal affect. Memory intact not anxious or depressed appearing.  CNS: CN 2-12 intact, power,  normal throughout.no focal deficits noted.   Assessment & Plan  Hyperlipidemia Hyperlipidemia:Low fat  diet discussed and encouraged.   Lipid Panel  Lab Results  Component Value Date   CHOL 164 04/28/2024   HDL 79 04/28/2024   LDLCALC 70 04/28/2024   TRIG 82 04/28/2024   CHOLHDL 2.1 04/28/2024      Controlled, no change in medication Updated lab needed at/ before next visit.   Essential (primary) hypertension Controlled, no change in medication DASH diet and commitment to daily physical activity for a minimum of 30 minutes discussed and encouraged, as a part of hypertension management. The importance of attaining a healthy weight is also discussed.     10/04/2024    8:02 AM 06/27/2024   11:20 AM 06/08/2024   10:33 AM 05/02/2024    8:02 AM 01/20/2024    8:04 AM 01/11/2024    1:11 PM 01/03/2024   10:53 AM  BP/Weight  Systolic BP 124  872 113 117 120 123  Diastolic BP 76  77 70 74 83 71  Wt. (Lbs) 215.12 209 206.57 204.12 203.08 202   BMI 32.71 kg/m2 31.78 kg/m2 31.41 kg/m2 31.04 kg/m2 30.88 kg/m2 30.71 kg/m2        CKD (chronic kidney disease) stage 3, GFR 30-59 ml/min (HCC) Stable and followed by Nephrology  GAD (generalized anxiety disorder) Controlled, no change in medication   Depression Controlled, no change in medication   Colon cancer screening FIT test to be done by pt  Type 2 diabetes mellitus with other specified complication (HCC) Diabetes associated with hypertension, hyperlipidemia, obesity,  CKD, and depression  Victoria Mcconnell is reminded of the importance of commitment to daily physical activity for 30 minutes or more, as able and the need to limit carbohydrate intake to 30 to 60 grams per meal to help with blood sugar control.   The need to take medication as prescribed, test blood sugar as directed, and to call between visits if there is a concern that blood sugar is uncontrolled is also discussed.   Victoria Mcconnell is reminded of the importance of daily foot exam, annual eye examination, and good blood sugar, blood pressure and cholesterol control.      Latest Ref Rng & Units 09/29/2024    8:08 AM 06/01/2024    9:55 AM 04/28/2024    9:23 AM 01/17/2024    8:17 AM 11/23/2023    9:56 AM  Diabetic Labs  HbA1c 4.8 - 5.6 % 6.2   6.3  6.1    Chol 100 - 199 mg/dL   835     HDL >60 mg/dL   79     Calc LDL 0 - 99 mg/dL   70     Triglycerides 0 - 149 mg/dL   82     Creatinine 9.42 - 1.00 mg/dL 8.87  8.84  8.85  8.75  1.11       10/04/2024    8:02 AM 06/27/2024   11:20 AM 06/08/2024   10:33 AM 05/02/2024    8:02 AM 01/20/2024    8:04 AM 01/11/2024    1:11 PM 01/03/2024   10:53 AM  BP/Weight  Systolic BP 124  872 113 117 120 123  Diastolic BP 76  77 70 74 83 71  Wt. (Lbs) 215.12 209 206.57 204.12 203.08 202   BMI 32.71 kg/m2 31.78 kg/m2 31.41 kg/m2 31.04 kg/m2 30.88 kg/m2 30.71 kg/m2       Latest Ref Rng & Units 01/20/2024    8:00 AM 03/23/2023   12:00 AM  Foot/eye exam completion dates  Eye Exam No Retinopathy  No Retinopathy      Foot Form Completion  Done      This result is from an external source.      Controlled, no change in medication   Vitamin D  deficiency Updated lab needed at/ before next visit.  "

## 2024-10-07 ENCOUNTER — Encounter: Payer: Self-pay | Admitting: Family Medicine

## 2024-10-07 DIAGNOSIS — Z1211 Encounter for screening for malignant neoplasm of colon: Secondary | ICD-10-CM | POA: Insufficient documentation

## 2024-10-07 DIAGNOSIS — E559 Vitamin D deficiency, unspecified: Secondary | ICD-10-CM | POA: Insufficient documentation

## 2024-10-07 NOTE — Assessment & Plan Note (Signed)
 Hyperlipidemia:Low fat diet discussed and encouraged.   Lipid Panel  Lab Results  Component Value Date   CHOL 164 04/28/2024   HDL 79 04/28/2024   LDLCALC 70 04/28/2024   TRIG 82 04/28/2024   CHOLHDL 2.1 04/28/2024      Controlled, no change in medication Updated lab needed at/ before next visit.

## 2024-10-07 NOTE — Assessment & Plan Note (Signed)
 Updated lab needed at/ before next visit.

## 2024-10-07 NOTE — Assessment & Plan Note (Signed)
 Diabetes associated with hypertension, hyperlipidemia, obesity, CKD, and depression  Victoria Mcconnell is reminded of the importance of commitment to daily physical activity for 30 minutes or more, as able and the need to limit carbohydrate intake to 30 to 60 grams per meal to help with blood sugar control.   The need to take medication as prescribed, test blood sugar as directed, and to call between visits if there is a concern that blood sugar is uncontrolled is also discussed.   [name] is reminded of the importance of daily foot exam, annual eye examination, and good blood sugar, blood pressure and cholesterol control.     Latest Ref Rng & Units 09/29/2024    8:08 AM 06/01/2024    9:55 AM 04/28/2024    9:23 AM 01/17/2024    8:17 AM 11/23/2023    9:56 AM  Diabetic Labs  HbA1c 4.8 - 5.6 % 6.2   6.3  6.1    Chol 100 - 199 mg/dL   835     HDL >60 mg/dL   79     Calc LDL 0 - 99 mg/dL   70     Triglycerides 0 - 149 mg/dL   82     Creatinine 9.42 - 1.00 mg/dL 8.87  8.84  8.85  8.75  1.11       10/04/2024    8:02 AM 06/27/2024   11:20 AM 06/08/2024   10:33 AM 05/02/2024    8:02 AM 01/20/2024    8:04 AM 01/11/2024    1:11 PM 01/03/2024   10:53 AM  BP/Weight  Systolic BP 124  872 113 117 120 123  Diastolic BP 76  77 70 74 83 71  Wt. (Lbs) 215.12 209 206.57 204.12 203.08 202   BMI 32.71 kg/m2 31.78 kg/m2 31.41 kg/m2 31.04 kg/m2 30.88 kg/m2 30.71 kg/m2       Latest Ref Rng & Units 01/20/2024    8:00 AM 03/23/2023   12:00 AM  Foot/eye exam completion dates  Eye Exam No Retinopathy  No Retinopathy      Foot Form Completion  Done      This result is from an external source.      Controlled, no change in medication

## 2024-10-07 NOTE — Assessment & Plan Note (Signed)
 Stable and followed by Nephrology

## 2024-10-07 NOTE — Assessment & Plan Note (Signed)
 Controlled, no change in medication

## 2024-10-07 NOTE — Assessment & Plan Note (Signed)
 Controlled, no change in medication DASH diet and commitment to daily physical activity for a minimum of 30 minutes discussed and encouraged, as a part of hypertension management. The importance of attaining a healthy weight is also discussed.     10/04/2024    8:02 AM 06/27/2024   11:20 AM 06/08/2024   10:33 AM 05/02/2024    8:02 AM 01/20/2024    8:04 AM 01/11/2024    1:11 PM 01/03/2024   10:53 AM  BP/Weight  Systolic BP 124  872 113 117 120 123  Diastolic BP 76  77 70 74 83 71  Wt. (Lbs) 215.12 209 206.57 204.12 203.08 202   BMI 32.71 kg/m2 31.78 kg/m2 31.41 kg/m2 31.04 kg/m2 30.88 kg/m2 30.71 kg/m2

## 2024-10-07 NOTE — Assessment & Plan Note (Signed)
 FIT test to be done by pt

## 2024-10-10 ENCOUNTER — Telehealth: Payer: Self-pay | Admitting: Family Medicine

## 2024-10-10 NOTE — Telephone Encounter (Unsigned)
 Copied from CRM #[id number]. Topic: General - Call Back - No Documentation >> Oct 09, 2024  2:22 PM Victoria Mcconnell wrote: Reason for CRM:  Pt was calling about a missed call for a cardio scan.  Cb [phone]

## 2024-10-15 LAB — MICROALBUMIN / CREATININE URINE RATIO
Creatinine, Urine: 91.6 mg/dL
Microalb/Creat Ratio: 16 mg/g{creat} (ref 0–29)
Microalbumin, Urine: 14.3 ug/mL

## 2024-10-27 ENCOUNTER — Other Ambulatory Visit (HOSPITAL_COMMUNITY)

## 2024-11-23 ENCOUNTER — Inpatient Hospital Stay

## 2024-11-29 ENCOUNTER — Inpatient Hospital Stay: Admitting: Oncology

## 2024-11-29 ENCOUNTER — Encounter: Admitting: Nutrition

## 2024-11-30 ENCOUNTER — Inpatient Hospital Stay: Admitting: Oncology

## 2024-11-30 ENCOUNTER — Ambulatory Visit: Admitting: Oncology

## 2024-12-04 ENCOUNTER — Ambulatory Visit: Admitting: Podiatry

## 2025-01-15 ENCOUNTER — Ambulatory Visit

## 2025-05-08 ENCOUNTER — Encounter: Payer: Self-pay | Admitting: Family Medicine
